# Patient Record
Sex: Female | Born: 1945
Health system: Southern US, Community
[De-identification: ages and names within clinical notes are randomized; demographics above are authoritative.]

## PROBLEM LIST (undated history)

## (undated) DIAGNOSIS — E785 Hyperlipidemia, unspecified: Secondary | ICD-10-CM

## (undated) DIAGNOSIS — Z9889 Other specified postprocedural states: Secondary | ICD-10-CM

## (undated) DIAGNOSIS — R3915 Urgency of urination: Secondary | ICD-10-CM

## (undated) DIAGNOSIS — M5136 Other intervertebral disc degeneration, lumbar region: Secondary | ICD-10-CM

## (undated) DIAGNOSIS — M2550 Pain in unspecified joint: Secondary | ICD-10-CM

## (undated) DIAGNOSIS — Z87442 Personal history of urinary calculi: Secondary | ICD-10-CM

## (undated) DIAGNOSIS — G8929 Other chronic pain: Secondary | ICD-10-CM

## (undated) DIAGNOSIS — K219 Gastro-esophageal reflux disease without esophagitis: Secondary | ICD-10-CM

## (undated) DIAGNOSIS — F419 Anxiety disorder, unspecified: Secondary | ICD-10-CM

## (undated) DIAGNOSIS — M549 Dorsalgia, unspecified: Secondary | ICD-10-CM

## (undated) DIAGNOSIS — N3281 Overactive bladder: Secondary | ICD-10-CM

## (undated) DIAGNOSIS — M1990 Unspecified osteoarthritis, unspecified site: Secondary | ICD-10-CM

## (undated) DIAGNOSIS — K5790 Diverticulosis of intestine, part unspecified, without perforation or abscess without bleeding: Secondary | ICD-10-CM

## (undated) DIAGNOSIS — M51369 Other intervertebral disc degeneration, lumbar region without mention of lumbar back pain or lower extremity pain: Secondary | ICD-10-CM

## (undated) DIAGNOSIS — R531 Weakness: Secondary | ICD-10-CM

## (undated) DIAGNOSIS — Z8709 Personal history of other diseases of the respiratory system: Secondary | ICD-10-CM

## (undated) DIAGNOSIS — M2540 Effusion, unspecified joint: Secondary | ICD-10-CM

## (undated) DIAGNOSIS — R112 Nausea with vomiting, unspecified: Secondary | ICD-10-CM

## (undated) DIAGNOSIS — E039 Hypothyroidism, unspecified: Secondary | ICD-10-CM

## (undated) DIAGNOSIS — R51 Headache: Secondary | ICD-10-CM

## (undated) HISTORY — PX: CARPAL TUNNEL RELEASE: SHX101

## (undated) HISTORY — PX: JOINT REPLACEMENT: SHX530

## (undated) HISTORY — PX: ABDOMINAL HYSTERECTOMY: SHX81

## (undated) HISTORY — PX: ESOPHAGOGASTRODUODENOSCOPY: SHX1529

## (undated) HISTORY — PX: HAND SURGERY: SHX662

## (undated) HISTORY — PX: EYE SURGERY: SHX253

## (undated) HISTORY — PX: COLONOSCOPY: SHX174

---

## 1978-07-05 HISTORY — PX: TONSILLECTOMY: SUR1361

## 1978-07-05 HISTORY — PX: BREAST SURGERY: SHX581

## 1979-07-06 HISTORY — PX: TOTAL THYROIDECTOMY: SHX2547

## 2011-07-06 HISTORY — PX: BACK SURGERY: SHX140

## 2012-03-24 ENCOUNTER — Other Ambulatory Visit: Payer: Self-pay | Admitting: Neurosurgery

## 2012-04-06 ENCOUNTER — Encounter (HOSPITAL_COMMUNITY): Payer: Self-pay | Admitting: Pharmacy Technician

## 2012-04-06 ENCOUNTER — Encounter (HOSPITAL_COMMUNITY): Payer: Self-pay

## 2012-04-06 ENCOUNTER — Encounter (HOSPITAL_COMMUNITY)
Admission: RE | Admit: 2012-04-06 | Discharge: 2012-04-06 | Disposition: A | Discharge status: Home / Self Care | Payer: Worker's Compensation | Source: Ambulatory Visit | Attending: Neurosurgery | Admitting: Neurosurgery

## 2012-04-06 HISTORY — DX: Other intervertebral disc degeneration, lumbar region: M51.36

## 2012-04-06 HISTORY — DX: Other intervertebral disc degeneration, lumbar region without mention of lumbar back pain or lower extremity pain: M51.369

## 2012-04-06 HISTORY — DX: Anxiety disorder, unspecified: F41.9

## 2012-04-06 HISTORY — DX: Hypothyroidism, unspecified: E03.9

## 2012-04-06 HISTORY — DX: Overactive bladder: N32.81

## 2012-04-06 HISTORY — DX: Unspecified osteoarthritis, unspecified site: M19.90

## 2012-04-06 LAB — BASIC METABOLIC PANEL
CO2: 27 mEq/L (ref 19–32)
Calcium: 9.3 mg/dL (ref 8.4–10.5)
GFR calc Af Amer: >90 mL/min (ref >90)

## 2012-04-06 LAB — CBC
MCV: 86.3 fL (ref 78.0–100.0)
Platelets: 311 10*3/uL (ref 150–400)
RBC: 4.98 MIL/uL (ref 3.87–5.11)
WBC: 11.2 10*3/uL — ABNORMAL HIGH (ref 4.0–10.5)

## 2012-04-06 LAB — SURGICAL PCR SCREEN: MRSA, PCR: NEGATIVE

## 2012-04-06 LAB — TYPE AND SCREEN
ABO/RH(D): O NEG
Antibody Screen: NEGATIVE

## 2012-04-06 NOTE — Pre-Procedure Instructions (Signed)
20 SMT. DOBBERTIN  04/06/2012   Your procedure is scheduled on:  Monday , October 14  Report to [facility] at 0530 AM.  Call this number if you have problems the morning of surgery: [phone]   Remember:   Do not eat food:After Midnight.  Take these medicines the morning of surgery with A SIP OF WATER: Levothyroxine, Omeprazole,Paroxetine,Estrace   Do not wear jewelry, make-up or nail polish.  Do not wear lotions, powders, or perfumes. You may wear deodorant.  Do not shave 48 hours prior to surgery. Men may shave face and neck.  Do not bring valuables to the hospital.  Contacts, dentures or bridgework may not be worn into surgery.  Leave suitcase in the car. After surgery it may be brought to your room.  For patients admitted to the hospital, checkout time is 11:00 AM the day of discharge.   Patients discharged the day of surgery will not be allowed to drive home.  Name and phone number of your driver: n/a  Special Instructions: Shower using CHG 2 nights before surgery and the night before surgery.  If you shower the day of surgery use CHG.  Use special wash - you have one bottle of CHG for all showers.  You should use approximately 1/3 of the bottle for each shower.   Please read over the following fact sheets that you were given: Pain Booklet, Coughing and Deep Breathing, Blood Transfusion Information and Surgical Site Infection Prevention

## 2012-04-13 ENCOUNTER — Other Ambulatory Visit: Payer: Self-pay | Admitting: Neurosurgery

## 2012-04-16 MED ORDER — VANCOMYCIN HCL IN DEXTROSE 1-5 GM/200ML-% IV SOLN
1000.0000 mg | INTRAVENOUS | Status: AC
  Administered 2012-04-17: 1000 mg via INTRAVENOUS
  Filled 2012-04-16: qty 200

## 2012-04-17 ENCOUNTER — Inpatient Hospital Stay (HOSPITAL_COMMUNITY): Payer: Worker's Compensation

## 2012-04-17 ENCOUNTER — Inpatient Hospital Stay (HOSPITAL_COMMUNITY)
Admission: RE | Admit: 2012-04-17 | Discharge: 2012-04-20 | DRG: 460 | Payer: Worker's Compensation | Source: Ambulatory Visit | Attending: Neurosurgery | Admitting: Neurosurgery

## 2012-04-17 ENCOUNTER — Encounter (HOSPITAL_COMMUNITY): Payer: Self-pay | Admitting: Anesthesiology

## 2012-04-17 ENCOUNTER — Encounter (HOSPITAL_COMMUNITY): Payer: Self-pay | Admitting: *Deleted

## 2012-04-17 ENCOUNTER — Inpatient Hospital Stay (HOSPITAL_COMMUNITY): Payer: Worker's Compensation | Admitting: Anesthesiology

## 2012-04-17 ENCOUNTER — Encounter (HOSPITAL_COMMUNITY)
Admission: RE | Disposition: A | Discharge status: Other Acute Inpatient Hospital | Payer: Self-pay | Source: Ambulatory Visit | Attending: Neurosurgery

## 2012-04-17 DIAGNOSIS — F329 Major depressive disorder, single episode, unspecified: Secondary | ICD-10-CM | POA: Diagnosis present

## 2012-04-17 DIAGNOSIS — Q762 Congenital spondylolisthesis: Secondary | ICD-10-CM

## 2012-04-17 DIAGNOSIS — E039 Hypothyroidism, unspecified: Secondary | ICD-10-CM | POA: Diagnosis present

## 2012-04-17 DIAGNOSIS — M5126 Other intervertebral disc displacement, lumbar region: Secondary | ICD-10-CM | POA: Diagnosis present

## 2012-04-17 DIAGNOSIS — Z87891 Personal history of nicotine dependence: Secondary | ICD-10-CM

## 2012-04-17 DIAGNOSIS — N318 Other neuromuscular dysfunction of bladder: Secondary | ICD-10-CM | POA: Diagnosis present

## 2012-04-17 DIAGNOSIS — M47817 Spondylosis without myelopathy or radiculopathy, lumbosacral region: Principal | ICD-10-CM | POA: Diagnosis present

## 2012-04-17 DIAGNOSIS — F3289 Other specified depressive episodes: Secondary | ICD-10-CM | POA: Diagnosis present

## 2012-04-17 DIAGNOSIS — Z90710 Acquired absence of both cervix and uterus: Secondary | ICD-10-CM

## 2012-04-17 DIAGNOSIS — K5900 Constipation, unspecified: Secondary | ICD-10-CM | POA: Diagnosis present

## 2012-04-17 DIAGNOSIS — F411 Generalized anxiety disorder: Secondary | ICD-10-CM | POA: Diagnosis present

## 2012-04-17 SURGERY — POSTERIOR LUMBAR FUSION 2 LEVEL
Anesthesia: General | Site: Back | Wound class: Clean

## 2012-04-17 MED ORDER — CYCLOBENZAPRINE HCL 10 MG PO TABS
ORAL_TABLET | ORAL | Status: AC
  Filled 2012-04-17: qty 1

## 2012-04-17 MED ORDER — ACETAMINOPHEN 10 MG/ML IV SOLN
INTRAVENOUS | Status: AC
  Filled 2012-04-17: qty 100

## 2012-04-17 MED ORDER — DOCUSATE SODIUM 100 MG PO CAPS
100.0000 mg | ORAL_CAPSULE | Freq: Two times a day (BID) | ORAL | Status: DC
  Administered 2012-04-17 – 2012-04-20 (×5): 100 mg via ORAL
  Filled 2012-04-17 (×5): qty 1

## 2012-04-17 MED ORDER — BUPIVACAINE HCL (PF) 0.25 % IJ SOLN
INTRAMUSCULAR | Status: DC | PRN
  Administered 2012-04-17: 10 mL

## 2012-04-17 MED ORDER — OXYCODONE HCL 5 MG/5ML PO SOLN: 5.0000 mg | Freq: Once | ORAL | Status: DC | PRN

## 2012-04-17 MED ORDER — DEXAMETHASONE SODIUM PHOSPHATE 10 MG/ML IJ SOLN
INTRAMUSCULAR | Status: AC
  Filled 2012-04-17: qty 1

## 2012-04-17 MED ORDER — ADULT MULTIVITAMIN W/MINERALS CH
1.0000 | ORAL_TABLET | Freq: Every day | ORAL | Status: DC
  Administered 2012-04-18 – 2012-04-20 (×3): 1 via ORAL
  Filled 2012-04-17 (×4): qty 1

## 2012-04-17 MED ORDER — SODIUM CHLORIDE 0.9 % IJ SOLN
3.0000 mL | Freq: Two times a day (BID) | INTRAMUSCULAR | Status: DC
  Administered 2012-04-18 – 2012-04-19 (×3): 3 mL via INTRAVENOUS

## 2012-04-17 MED ORDER — LEVOTHYROXINE SODIUM 88 MCG PO TABS
88.0000 ug | ORAL_TABLET | Freq: Every day | ORAL | Status: DC
  Administered 2012-04-18 – 2012-04-20 (×3): 88 ug via ORAL
  Filled 2012-04-17 (×4): qty 1

## 2012-04-17 MED ORDER — SODIUM CHLORIDE 0.9 % IJ SOLN: 3.0000 mL | INTRAMUSCULAR | Status: DC | PRN

## 2012-04-17 MED ORDER — OXYCODONE HCL 5 MG PO TABS
ORAL_TABLET | ORAL | Status: AC
  Filled 2012-04-17: qty 2

## 2012-04-17 MED ORDER — OXYCODONE HCL 5 MG PO TABS: 5.0000 mg | ORAL_TABLET | Freq: Once | ORAL | Status: DC | PRN

## 2012-04-17 MED ORDER — INFLUENZA VIRUS VACC SPLIT PF IM SUSP: 0.5000 mL | INTRAMUSCULAR | Status: DC | PRN

## 2012-04-17 MED ORDER — ONDANSETRON HCL 4 MG/2ML IJ SOLN: 4.0000 mg | INTRAMUSCULAR | Status: DC | PRN

## 2012-04-17 MED ORDER — SODIUM CHLORIDE 0.9 % IV SOLN
INTRAVENOUS | Status: AC
  Filled 2012-04-17: qty 500

## 2012-04-17 MED ORDER — MELOXICAM 15 MG PO TABS
15.0000 mg | ORAL_TABLET | Freq: Every day | ORAL | Status: DC
  Administered 2012-04-18 – 2012-04-20 (×3): 15 mg via ORAL
  Filled 2012-04-17 (×4): qty 1

## 2012-04-17 MED ORDER — LIDOCAINE-EPINEPHRINE 1 %-1:100000 IJ SOLN
INTRAMUSCULAR | Status: DC | PRN
  Administered 2012-04-17: 9 mL

## 2012-04-17 MED ORDER — MENTHOL 3 MG MT LOZG: 1.0000 | LOZENGE | OROMUCOSAL | Status: DC | PRN

## 2012-04-17 MED ORDER — PANTOPRAZOLE SODIUM 40 MG PO TBEC
40.0000 mg | DELAYED_RELEASE_TABLET | Freq: Every day | ORAL | Status: DC
  Administered 2012-04-18 – 2012-04-20 (×3): 40 mg via ORAL
  Filled 2012-04-17 (×2): qty 1

## 2012-04-17 MED ORDER — VANCOMYCIN HCL IN DEXTROSE 1-5 GM/200ML-% IV SOLN
1000.0000 mg | Freq: Two times a day (BID) | INTRAVENOUS | Status: AC
  Administered 2012-04-17: 1000 mg via INTRAVENOUS
  Filled 2012-04-17: qty 200

## 2012-04-17 MED ORDER — GLYCOPYRROLATE 0.2 MG/ML IJ SOLN
INTRAMUSCULAR | Status: DC | PRN
  Administered 2012-04-17: .4 mg via INTRAVENOUS

## 2012-04-17 MED ORDER — ACETAMINOPHEN 10 MG/ML IV SOLN
INTRAVENOUS | Status: DC | PRN
  Administered 2012-04-17: 1000 mg via INTRAVENOUS

## 2012-04-17 MED ORDER — HYDROMORPHONE HCL PF 1 MG/ML IJ SOLN: 0.5000 mg | INTRAMUSCULAR | Status: DC | PRN

## 2012-04-17 MED ORDER — OXYCODONE HCL 5 MG PO TABS
10.0000 mg | ORAL_TABLET | ORAL | Status: DC | PRN
  Administered 2012-04-17 – 2012-04-19 (×6): 10 mg via ORAL
  Administered 2012-04-19: 5 mg via ORAL
  Filled 2012-04-17 (×5): qty 2
  Filled 2012-04-17: qty 1

## 2012-04-17 MED ORDER — 0.9 % SODIUM CHLORIDE (POUR BTL) OPTIME
TOPICAL | Status: DC | PRN
  Administered 2012-04-17: 1000 mL

## 2012-04-17 MED ORDER — FENTANYL CITRATE 0.05 MG/ML IJ SOLN
INTRAMUSCULAR | Status: DC | PRN
Start: 2012-04-17 — End: 2012-04-17
  Administered 2012-04-17: 50 ug via INTRAVENOUS
  Administered 2012-04-17 (×2): 100 ug via INTRAVENOUS
  Administered 2012-04-17 (×2): 50 ug via INTRAVENOUS

## 2012-04-17 MED ORDER — THROMBIN 20000 UNITS EX KIT
PACK | CUTANEOUS | Status: DC | PRN
  Administered 2012-04-17: 20000 [IU] via TOPICAL

## 2012-04-17 MED ORDER — PHENOL 1.4 % MT LIQD: 1.0000 | OROMUCOSAL | Status: DC | PRN

## 2012-04-17 MED ORDER — ALUM & MAG HYDROXIDE-SIMETH 200-200-20 MG/5ML PO SUSP: 30.0000 mL | Freq: Four times a day (QID) | ORAL | Status: DC | PRN

## 2012-04-17 MED ORDER — MIDAZOLAM HCL 5 MG/5ML IJ SOLN
INTRAMUSCULAR | Status: DC | PRN
  Administered 2012-04-17: 2 mg via INTRAVENOUS

## 2012-04-17 MED ORDER — SODIUM CHLORIDE 0.9 % IR SOLN
Status: DC | PRN
  Administered 2012-04-17: 08:00:00

## 2012-04-17 MED ORDER — ONDANSETRON HCL 4 MG/2ML IJ SOLN
INTRAMUSCULAR | Status: DC | PRN
  Administered 2012-04-17: 4 mg via INTRAVENOUS

## 2012-04-17 MED ORDER — DEXAMETHASONE SODIUM PHOSPHATE 10 MG/ML IJ SOLN
10.0000 mg | INTRAMUSCULAR | Status: AC
  Administered 2012-04-17: 10 mg via INTRAVENOUS

## 2012-04-17 MED ORDER — SODIUM CHLORIDE 0.9 % IV SOLN
INTRAVENOUS | Status: DC | PRN
  Administered 2012-04-17: 10:00:00 via INTRAVENOUS

## 2012-04-17 MED ORDER — CYCLOBENZAPRINE HCL 10 MG PO TABS
10.0000 mg | ORAL_TABLET | Freq: Three times a day (TID) | ORAL | Status: DC | PRN
  Administered 2012-04-17 (×2): 10 mg via ORAL
  Filled 2012-04-17: qty 1

## 2012-04-17 MED ORDER — ACETAMINOPHEN 650 MG RE SUPP: 650.0000 mg | RECTAL | Status: DC | PRN

## 2012-04-17 MED ORDER — LIDOCAINE HCL (CARDIAC) 20 MG/ML IV SOLN
INTRAVENOUS | Status: DC | PRN
  Administered 2012-04-17: 100 mg via INTRAVENOUS

## 2012-04-17 MED ORDER — ACETAMINOPHEN 325 MG PO TABS
650.0000 mg | ORAL_TABLET | ORAL | Status: DC | PRN
  Administered 2012-04-19 – 2012-04-20 (×2): 650 mg via ORAL
  Filled 2012-04-17 (×2): qty 2

## 2012-04-17 MED ORDER — PAROXETINE HCL 20 MG PO TABS
20.0000 mg | ORAL_TABLET | Freq: Every day | ORAL | Status: DC
  Administered 2012-04-18 – 2012-04-20 (×3): 20 mg via ORAL
  Filled 2012-04-17 (×3): qty 1

## 2012-04-17 MED ORDER — NEOSTIGMINE METHYLSULFATE 1 MG/ML IJ SOLN
INTRAMUSCULAR | Status: DC | PRN
  Administered 2012-04-17: 3.5 mg via INTRAVENOUS

## 2012-04-17 MED ORDER — ROCURONIUM BROMIDE 100 MG/10ML IV SOLN
INTRAVENOUS | Status: DC | PRN
  Administered 2012-04-17: 50 mg via INTRAVENOUS

## 2012-04-17 MED ORDER — CEFAZOLIN SODIUM 1-5 GM-% IV SOLN: 1.0000 g | Freq: Three times a day (TID) | INTRAVENOUS | Status: DC

## 2012-04-17 MED ORDER — PREDNISOLONE ACETATE 1 % OP SUSP
1.0000 [drp] | Freq: Three times a day (TID) | OPHTHALMIC | Status: DC
  Administered 2012-04-17 – 2012-04-20 (×8): 1 [drp] via OPHTHALMIC
  Filled 2012-04-17: qty 1

## 2012-04-17 MED ORDER — BACITRACIN 50000 UNITS IM SOLR
INTRAMUSCULAR | Status: AC
  Filled 2012-04-17: qty 1

## 2012-04-17 MED ORDER — VECURONIUM BROMIDE 10 MG IV SOLR
INTRAVENOUS | Status: DC | PRN
  Administered 2012-04-17 (×2): 2 mg via INTRAVENOUS

## 2012-04-17 MED ORDER — LACTATED RINGERS IV SOLN
INTRAVENOUS | Status: DC | PRN
  Administered 2012-04-17: 07:00:00 via INTRAVENOUS

## 2012-04-17 MED ORDER — HEMOSTATIC AGENTS (NO CHARGE) OPTIME
TOPICAL | Status: DC | PRN
  Administered 2012-04-17: 1 via TOPICAL

## 2012-04-17 MED ORDER — HYDROMORPHONE HCL PF 1 MG/ML IJ SOLN: 0.2500 mg | INTRAMUSCULAR | Status: DC | PRN

## 2012-04-17 MED ORDER — ACYCLOVIR 400 MG PO TABS
400.0000 mg | ORAL_TABLET | Freq: Two times a day (BID) | ORAL | Status: DC
  Administered 2012-04-18: 400 mg via ORAL
  Filled 2012-04-17 (×3): qty 1

## 2012-04-17 MED ORDER — PROPOFOL 10 MG/ML IV BOLUS
INTRAVENOUS | Status: DC | PRN
  Administered 2012-04-17: 160 mg via INTRAVENOUS

## 2012-04-17 MED ORDER — ONDANSETRON HCL 4 MG/2ML IJ SOLN: 4.0000 mg | Freq: Once | INTRAMUSCULAR | Status: DC | PRN

## 2012-04-17 MED ORDER — SODIUM CHLORIDE 0.9 % IV SOLN: 250.0000 mL | INTRAVENOUS | Status: DC

## 2012-04-17 MED ORDER — ESTRADIOL 1 MG PO TABS
0.5000 mg | ORAL_TABLET | Freq: Every day | ORAL | Status: DC
  Administered 2012-04-18 – 2012-04-20 (×3): 0.5 mg via ORAL
  Filled 2012-04-17 (×3): qty 0.5

## 2012-04-17 MED ORDER — MEPERIDINE HCL 25 MG/ML IJ SOLN: 6.2500 mg | INTRAMUSCULAR | Status: DC | PRN

## 2012-04-17 SURGICAL SUPPLY — 73 items
BAG DECANTER FOR FLEXI CONT (MISCELLANEOUS) ×2 IMPLANT
BENZOIN TINCTURE PRP APPL 2/3 (GAUZE/BANDAGES/DRESSINGS) ×2 IMPLANT
BLADE SURG 11 STRL SS (BLADE) ×2 IMPLANT
BLADE SURG ROTATE 9660 (MISCELLANEOUS) IMPLANT
BRUSH SCRUB EZ PLAIN DRY (MISCELLANEOUS) ×2 IMPLANT
BUR MATCHSTICK NEURO 3.0 LAGG (BURR) ×2 IMPLANT
BUR PRECISION FLUTE 6.0 (BURR) ×2 IMPLANT
CANISTER SUCTION 2500CC (MISCELLANEOUS) ×2 IMPLANT
CAP LOCKING REVERE (Cap) ×12 IMPLANT
CLOTH BEACON ORANGE TIMEOUT ST (SAFETY) ×2 IMPLANT
CONNECTOR VAR CROSS 5.5X38-50 (Connector) ×2 IMPLANT
CONT SPEC 4OZ CLIKSEAL STRL BL (MISCELLANEOUS) ×4 IMPLANT
COVER BACK TABLE 24X17X13 BIG (DRAPES) IMPLANT
COVER TABLE BACK 60X90 (DRAPES) ×2 IMPLANT
DECANTER SPIKE VIAL GLASS SM (MISCELLANEOUS) ×2 IMPLANT
DERMABOND ADVANCED (GAUZE/BANDAGES/DRESSINGS) ×1
DERMABOND ADVANCED .7 DNX12 (GAUZE/BANDAGES/DRESSINGS) ×1 IMPLANT
DRAPE C-ARM 42X72 X-RAY (DRAPES) ×4 IMPLANT
DRAPE LAPAROTOMY 100X72X124 (DRAPES) ×2 IMPLANT
DRAPE POUCH INSTRU U-SHP 10X18 (DRAPES) ×2 IMPLANT
DRAPE PROXIMA HALF (DRAPES) IMPLANT
DRAPE SURG 17X23 STRL (DRAPES) ×2 IMPLANT
DRSG OPSITE 4X5.5 SM (GAUZE/BANDAGES/DRESSINGS) ×2 IMPLANT
ELECT REM PT RETURN 9FT ADLT (ELECTROSURGICAL) ×2
ELECTRODE REM PT RTRN 9FT ADLT (ELECTROSURGICAL) ×1 IMPLANT
EVACUATOR 3/16  PVC DRAIN (DRAIN) ×1
EVACUATOR 3/16 PVC DRAIN (DRAIN) ×1 IMPLANT
GAUZE SPONGE 4X4 16PLY XRAY LF (GAUZE/BANDAGES/DRESSINGS) ×2 IMPLANT
GLOVE BIO SURGEON STRL SZ7.5 (GLOVE) ×2 IMPLANT
GLOVE BIO SURGEON STRL SZ8 (GLOVE) ×4 IMPLANT
GLOVE BIOGEL PI IND STRL 7.0 (GLOVE) ×1 IMPLANT
GLOVE BIOGEL PI IND STRL 7.5 (GLOVE) ×1 IMPLANT
GLOVE BIOGEL PI INDICATOR 7.0 (GLOVE) ×1
GLOVE BIOGEL PI INDICATOR 7.5 (GLOVE) ×1
GLOVE ECLIPSE 8.5 STRL (GLOVE) ×2 IMPLANT
GLOVE EXAM NITRILE LRG STRL (GLOVE) IMPLANT
GLOVE EXAM NITRILE MD LF STRL (GLOVE) IMPLANT
GLOVE EXAM NITRILE XL STR (GLOVE) IMPLANT
GLOVE EXAM NITRILE XS STR PU (GLOVE) IMPLANT
GLOVE INDICATOR 8.5 STRL (GLOVE) ×4 IMPLANT
GLOVE SS BIOGEL STRL SZ 6.5 (GLOVE) ×4 IMPLANT
GLOVE SUPERSENSE BIOGEL SZ 6.5 (GLOVE) ×4
GOWN BRE IMP SLV AUR LG STRL (GOWN DISPOSABLE) ×2 IMPLANT
GOWN BRE IMP SLV AUR XL STRL (GOWN DISPOSABLE) ×6 IMPLANT
GOWN STRL REIN 2XL LVL4 (GOWN DISPOSABLE) IMPLANT
KIT BASIN OR (CUSTOM PROCEDURE TRAY) ×2 IMPLANT
KIT ROOM TURNOVER OR (KITS) ×2 IMPLANT
MILL MEDIUM DISP (BLADE) ×2 IMPLANT
NEEDLE HYPO 25X1 1.5 SAFETY (NEEDLE) ×2 IMPLANT
NS IRRIG 1000ML POUR BTL (IV SOLUTION) ×2 IMPLANT
PACK LAMINECTOMY NEURO (CUSTOM PROCEDURE TRAY) ×2 IMPLANT
PAD ARMBOARD 7.5X6 YLW CONV (MISCELLANEOUS) ×6 IMPLANT
PATTIES SURGICAL 1X1 (DISPOSABLE) ×2 IMPLANT
PUTTY BONE DBX 5CC MIX (Putty) ×2 IMPLANT
ROD STRAIGHT 5.5MMX65MM (Rod) ×4 IMPLANT
SCREW PEDICLE 6.5MMX45MM (Screw) ×4 IMPLANT
SCREW PEDICLE 6.5X40MM (Screw) ×8 IMPLANT
SCREW PEDICLE 6.5X45 (Screw) ×2 IMPLANT
SPACER CALIBER 10X22 9-13MM-12 (Spacer) ×4 IMPLANT
SPACER CALIBER 10X22MM 11-15MM (Spacer) ×4 IMPLANT
SPONGE GAUZE 4X4 12PLY (GAUZE/BANDAGES/DRESSINGS) ×2 IMPLANT
SPONGE LAP 4X18 X RAY DECT (DISPOSABLE) IMPLANT
SPONGE SURGIFOAM ABS GEL 100 (HEMOSTASIS) ×4 IMPLANT
STRIP CLOSURE SKIN 1/2X4 (GAUZE/BANDAGES/DRESSINGS) ×2 IMPLANT
SUT VIC AB 0 CT1 18XCR BRD8 (SUTURE) ×1 IMPLANT
SUT VIC AB 0 CT1 8-18 (SUTURE) ×1
SUT VIC AB 2-0 CT1 18 (SUTURE) ×2 IMPLANT
SUT VICRYL 4-0 PS2 18IN ABS (SUTURE) ×2 IMPLANT
SYR 20ML ECCENTRIC (SYRINGE) ×2 IMPLANT
TOWEL OR 17X24 6PK STRL BLUE (TOWEL DISPOSABLE) ×2 IMPLANT
TOWEL OR 17X26 10 PK STRL BLUE (TOWEL DISPOSABLE) ×2 IMPLANT
TRAY FOLEY CATH 14FRSI W/METER (CATHETERS) ×2 IMPLANT
WATER STERILE IRR 1000ML POUR (IV SOLUTION) ×2 IMPLANT

## 2012-04-17 NOTE — H&P (Signed)
Shannon Gutierrez is an 66 y.o. female.   Chief Complaint: Back and right greater left leg pain HPI: Patient is a very pleasant 66 year old female who sustained a work-related injury where she fell off a stool at work and ever since and has been experiencing severe back and right greater left leg pain. Workup has revealed severe spinal stenosis from disc bulge and facet arthropathy at L3-4 and L4-5 patient's failed all forms of conservative treatment due to her imaging and progression of clinical syndrome in her back greater than right leg pain I recommended a decompression stabilization procedure at L3-4 and L4-5 I extensively reviewed the risks and benefits of the operation with the patient as well as perioperative course and expectations of outcome alternatives of surgery she understands and agrees to proceed forward.  Past Medical History  Diagnosis Date  . Hypothyroidism   . Anxiety   . Overactive bladder   . Arthritis     osteoarthritis in knees and back  . Degeneration of lumbar intervertebral disc     Past Surgical History  Procedure Date  . Carpal tunnel release     bilateral  . Tonsillectomy   . Abdominal hysterectomy   . Eye surgery   . Breast surgery 1980    augmentation  . Joint replacement   . Hand surgery     CMP joint  . Total thyroidectomy 1980    History reviewed. No pertinent family history. Social History:  reports that she has quit smoking. Her smoking use included Cigarettes. She has a 10 pack-year smoking history. She has never used smokeless tobacco. She reports that she does not drink alcohol or use illicit drugs.  Allergies:  Allergies  Allergen Reactions  . Keflex (Cephalexin) Other (See Comments)    Facial swelling and rash  . Morphine And Related Nausea And Vomiting    Medications Prior to Admission  Medication Sig Dispense Refill  . acyclovir (ZOVIRAX) 400 MG tablet Take 400 mg by mouth 2 (two) times daily as needed. For cold sore flare ups        . estradiol (ESTRACE) 0.5 MG tablet Take 0.5 mg by mouth daily.      Marland Kitchen levothyroxine (SYNTHROID, LEVOTHROID) 88 MCG tablet Take 88 mcg by mouth daily.      . meloxicam (MOBIC) 15 MG tablet Take 15 mg by mouth daily.      . Multiple Vitamin (MULTIVITAMIN WITH MINERALS) TABS Take 1 tablet by mouth daily.      Marland Kitchen omeprazole (PRILOSEC) 20 MG capsule Take 20 mg by mouth daily.      Marland Kitchen PARoxetine (PAXIL) 20 MG tablet Take 20 mg by mouth every morning.        No results found for this or any previous visit (from the past 48 hour(s)). No results found.  Review of Systems  Constitutional: Negative.   HENT: Negative.   Eyes: Negative.   Cardiovascular: Negative.   Gastrointestinal: Negative.   Genitourinary: Negative.   Musculoskeletal: Positive for myalgias, back pain and joint pain.  Skin: Negative.   Neurological: Positive for tingling and tremors.  Endo/Heme/Allergies: Negative.   Psychiatric/Behavioral: Negative.     Blood pressure 159/85, pulse 76, temperature 98.2 F (36.8 C), temperature source Oral, resp. rate 18, SpO2 96.00%. Physical Exam  Constitutional: She is oriented to person, place, and time. She appears well-developed and well-nourished.  HENT:  Head: Normocephalic.  Eyes: Conjunctivae normal are normal. Pupils are equal, round, and reactive to light.  Neck: Normal range  of motion.  Cardiovascular: Normal rate.   Respiratory: Effort normal.  GI: Soft.  Neurological: She is alert and oriented to person, place, and time. She has normal strength. GCS eye subscore is 4. GCS verbal subscore is 5. GCS motor subscore is 6.  Reflex Scores:      Patellar reflexes are 0 on the right side and 0 on the left side.      Achilles reflexes are 0 on the right side and 0 on the left side.      Strength is 5 out of 5 in her iliopsoas, quads, and she's, gastrocs, anterior tibialis, and EHL     Assessment/Plan 66 year old female that presents for L3-4 and L4-5 posterior lumbar  interbody fusion  Ashanty Coltrane P 04/17/2012, 7:27 AM

## 2012-04-17 NOTE — Transfer of Care (Signed)
Immediate Anesthesia Transfer of Care Note  Patient: Shannon Gutierrez  Procedure(s) Performed: Procedure(s) (LRB) with comments: POSTERIOR LUMBAR FUSION 2 LEVEL (N/A) - posterior lumbar interbody fusion lumbar three-four, lumbar four-five with screws and rods  Patient Location: PACU  Anesthesia Type: General  Level of Consciousness: sedated, patient cooperative and responds to stimulation  Airway & Oxygen Therapy: Patient Spontanous Breathing and Patient connected to nasal cannula oxygen  Post-op Assessment: Report given to PACU RN, Post -op Vital signs reviewed and stable and Patient moving all extremities X 4  Post vital signs: Reviewed and stable  Complications: No apparent anesthesia complications

## 2012-04-17 NOTE — Anesthesia Postprocedure Evaluation (Signed)
Anesthesia Post Note  Patient: Shannon Gutierrez  Procedure(s) Performed: Procedure(s) (LRB): POSTERIOR LUMBAR FUSION 2 LEVEL (N/A)  Anesthesia type: general  Patient location: PACU  Post pain: Pain level controlled  Post assessment: Patient's Cardiovascular Status Stable  Last Vitals:  Filed Vitals:   04/17/12 1215  BP: 125/47  Pulse: 88  Temp:   Resp: 19    Post vital signs: Reviewed and stable  Level of consciousness: sedated  Complications: No apparent anesthesia complications

## 2012-04-17 NOTE — Op Note (Signed)
Preoperative diagnosis: Lumbar spinal stenosis from herniated nucleus pulposus L4-5 and L3-4 facet arthropathy spondylosis L3-4 L4-5 and grade 1 spondylolisthesis L4-5  Postoperative diagnosis: Same  Procedure: Decompressive lumbar laminectomy at L3-4 L4-5 and excess will be needed with a standard interbody fusion  2) posterior lumbar interbody fusion L3-4 L4-5 using caliper expandable peek cages packed with local autograft mixed with DBX  #3 pedicle screw fixation using the globus Revere pedicle to system 5.5  #4 posterior lateral arthrodesis L3-4 L4-5  #5 placement of large Hemovac drain  Surgeon: Jillyn Hidden Sotiria Keast  Assistant: Sherilyn Cooter pool  Anesthesia: Gen.  EBL: 600  History of present illness: Patient is a very pleasant 66 year old female who sustained a work-related injury he has had progressive worsening back and on the right leg pain rate in the back of her leg her shin top foot and occasionally anterior quad and lateral thigh. Imaging showed herniated disc L4-5 on the right severe stress reactions in both pedicles marked facet arthropathy grade 1 spinal listhesis L4-5 and severe stenosis at L3-4 from a combination of facet arthropathy and disc herniation to the left due to patient's failure conservative treatment imaging findings and progression of clinical syndrome she was recommended decompression stabilization procedure at L3-4 L4-5 except for what reviewed the risks and benefits of the operation as well as perioperative course and expectations of outcome alternatives surgery and she understands and agrees to proceed forward.  Operative procedure: Patient was brought into the or was induced under general anesthesia her back was prepped and draped in routine sterile fashion. After infiltration 10 cc lidocaine with epi a midline incision was made and Bovie left car was used to take down the subcutaneous tissues and subperiosteal dissections care lamina of L3 and L4-L5 bilaterally the TPS L3-4  and 5 were also exposed interoperative X. identify the appropriate level so spinous processes at L4 and L5 removed L4 possibly markedly unstable with hyper mediastinal and a complex being in her hypermobile Sosa to be decompression was begun at L4-5 and then however due to the marked hourglass compression of thecal sac at this level I went up higher at L3-4 and finish the central decompression and complete medial facetectomies at that level with foraminotomies of the 3 and the 4 root and in March down further unroofing the 4 root and due to medial facetectomies L4-5 the this allowed adequate decompression of both 34 and V nerve roots bilaterally and aggressive abutting of the superior tickling facet both sides also Galli the lateral access to the disc spaces. The decompression been completed attention was taken to pedicle screw placement using a high-speed drill pilot holes were drilled and L3 on the right cannulated with the awl probed O55 Probed again and a 6 5 x 45 screw inserted L3 on the right. Fluoroscopy used the step along the way and using direct intra-1 external bony landmarks and this confirmed no mediolateral breech. Then in a similar fashion 6 x 40 screws inserted L4-L5 on the right and 12/08/1944 and L3 on the left and a 6 x 40 at L4-L5 on the left the after all the screws and placed at its taken the interbody work I distractor in size 10 was inserted and the right-sided disc spaces and cleanout the left at L4-5 using a size 9 rotating cutter and Epstein curettes endplates were prepared in a large amount disc material was removed from the left side both centrally and laterally. Then an 11 mm cages packed this was inserted on the  left side expanded for turns up to approximately 13 mm then the distractors removed fluoroscopy confirmed good step in each position of the implant along the way. Then at L3-4 in a similar fashion a 10 distractor was inserted however this is felt to be a appropriate sizing for  a 9 mm graft so a similar fashion the endplates were prepared large lateral disc herniation was removed from the lateral compartment the disc space at L3-4 and the undersurface of the 3 and 4 were aggressively decompressed with removal that this. In 9 the medications inserted expanded up to about 11/2 mm extractor was removed fluoroscopy confirmed good position. Then all this was repeated on the right side there is a very large lateral disc herniation L4-5 on the right this was teased off of the 4 and 5 root decompressing the disc space is also a central disc was removed local autograft packed centrally the right-sided cage was inserted and a similar fashion L3-4 was also cleaned out and packed autograft centrally and a cages packed laterally. Fluoroscopy used in simple the way confirm the position of the implants then final AP and lateral projections confirmed good position of screws and implants and the wound scope was irrigated meticulous in space was maintained aggressive decortication carried TPS lateral gutters the rods were then selected top tightness and at L5 the L4 screws compressed this L5 any L3 compressed against L4 crossing was applied after all the foraminal reinspected confirm patency no migration of graft material Gelfoam was laid over the dura a large Hemovac drain was placed the wounds and closed in layers with after Vicryl the skin was closed a 4 subcuticular,  benzo and Steri-Strips were applied patient recovered in stable condition. At the end of case on it counts and sponge counts were correct.

## 2012-04-17 NOTE — Anesthesia Preprocedure Evaluation (Addendum)
Anesthesia Evaluation  Patient identified by MRN, date of birth, ID band Patient awake    Reviewed: Allergy & Precautions, H&P , NPO status , Patient's Chart, lab work & pertinent test results  Airway Mallampati: I TM Distance: >3 FB Neck ROM: Full    Dental   Pulmonary former smoker,          Cardiovascular     Neuro/Psych    GI/Hepatic GERD-  Medicated and Controlled,  Endo/Other    Renal/GU      Musculoskeletal   Abdominal   Peds  Hematology   Anesthesia Other Findings   Reproductive/Obstetrics                           Anesthesia Physical Anesthesia Plan  ASA: II  Anesthesia Plan: General   Post-op Pain Management:    Induction: Intravenous  Airway Management Planned: Oral ETT  Additional Equipment:   Intra-op Plan:   Post-operative Plan: Extubation in OR  Informed Consent: I have reviewed the patients History and Physical, chart, labs and discussed the procedure including the risks, benefits and alternatives for the proposed anesthesia with the patient or authorized representative who has indicated his/her understanding and acceptance.     Plan Discussed with: CRNA and Surgeon  Anesthesia Plan Comments:         Anesthesia Quick Evaluation  

## 2012-04-17 NOTE — Progress Notes (Signed)
Utilization review completed. Kyilee Gregg, RN, BSN. 

## 2012-04-18 MED ORDER — ACYCLOVIR 200 MG PO CAPS
400.0000 mg | ORAL_CAPSULE | Freq: Two times a day (BID) | ORAL | Status: DC
  Administered 2012-04-18 – 2012-04-20 (×4): 400 mg via ORAL
  Filled 2012-04-18 (×5): qty 2

## 2012-04-18 NOTE — Progress Notes (Addendum)
   CARE MANAGEMENT NOTE 04/18/2012  Patient:  Shannon Gutierrez, Shannon Gutierrez   Account Number:  [id number]  Date Initiated:  04/18/2012  Documentation initiated by:  [facility]  Subjective/Objective Assessment:   decompression lumbar laminectomy at L3-L4, and L4-L5     Action/Plan:   lives at home   Anticipated DC Date:  04/20/2012   Anticipated DC Plan:  IP REHAB FACILITY      DC Planning Services  CM consult      Choice offered to / List presented to:             Status of service:  In process, will continue to follow Medicare Important Message given?   (If response is "NO", the following Medicare IM given date fields will be blank) Date Medicare IM given:   Date Additional Medicare IM given:    Discharge Disposition:    Per UR Regulation:  Reviewed for med. necessity/level of care/duration of stay  If discussed at Long Length of Stay Meetings, dates discussed:    Comments:  04/18/2012 1400 NCM received call back from Shannon Gutierrez requesting PT/OT recommendations. Fax # [phone]. States she will follow up with adjuster. NCM made aware that pt will had Inpt Rehab consult. Request info be faxed when complete about acceptance or denial for CIR. Shannon Donning RN CCM Case Mgmt  phone [phone]   04/18/2012 1030 NCM spoke to pt and states she does live at home alone. She is interested in SNF or Inpatient Rehab. CIR consult and waiting recommendations. States she does not have any DME at home. Her back injury is a work related accident. Pt had questions about if Worker's Comp will cover her medical expenses. NCM instructed her to follow up with her Case Worker. Pt gave permission to follow up with her Worker's Comp NCM, Shannon Gutierrez and/or adjuster, Alla Feeling with clinical information.  Nurse Case Manager- Shannon [phone]  [facility] Norlene Campbell [phone]  Shannon Donning RN CCM Case Mgmt phone [phone]

## 2012-04-18 NOTE — Evaluation (Signed)
Occupational Therapy Evaluation Patient Details Name: Shannon Gutierrez MRN: [id number] DOB: 1945/11/02 Today's Date: 04/18/2012 Time: 2440-1027 OT Time Calculation (min): 36 min  OT Assessment / Plan / Recommendation Clinical Impression  Pt presents to OT s/p back surgery.  Pt will benefit from acute OT services to increase I with ADL activity while following back precautions    OT Assessment  Patient needs continued OT Services    Follow Up Recommendations  Home health OT;Inpatient Rehab;Skilled nursing facility;Other (comment) (depending on care available at home)       Equipment Recommendations  3 in 1 bedside comode       Frequency  Min 3X/week    Precautions / Restrictions Precautions Precautions: Back Precaution Booklet Issued: Yes (comment) Required Braces or Orthoses: Spinal Brace Spinal Brace: Lumbar corset;Applied in sitting position       ADL  Eating/Feeding: Performed;Set up Where Assessed - Eating/Feeding: Chair Grooming: Simulated;Set up Where Assessed - Grooming: Supported sitting Upper Body Bathing: Simulated;Minimal assistance Where Assessed - Upper Body Bathing: Unsupported sitting Lower Body Bathing: Simulated;Maximal assistance Where Assessed - Lower Body Bathing: Unsupported sit to stand Lower Body Dressing: Performed;Moderate assistance Where Assessed - Lower Body Dressing: Unsupported sit to stand Toilet Transfer: Simulated;Minimal assistance Toilet Transfer Method: Sit to stand Toilet Transfer Equipment: Comfort height toilet Toileting - Clothing Manipulation and Hygiene: Simulated;Minimal assistance Where Assessed - Engineer, mining and Hygiene: Standing Tub/Shower Transfer Method: Not assessed    OT Diagnosis:    OT Problem List: Decreased strength;Pain;Decreased knowledge of precautions OT Treatment Interventions: Patient/family education;DME and/or AE instruction   OT Goals Acute Rehab OT Goals OT Goal Formulation: With  patient Time For Goal Achievement: 05/02/12 Potential to Achieve Goals: Good ADL Goals Pt Will Perform Grooming: with modified independence;Standing at sink ADL Goal: Grooming - Progress: Goal set today Pt Will Perform Lower Body Dressing: with modified independence;Sit to stand from chair;with adaptive equipment ADL Goal: Lower Body Dressing - Progress: Goal set today Pt Will Transfer to Toilet: with modified independence;Comfort height toilet ADL Goal: Toilet Transfer - Progress: Goal set today Pt Will Perform Toileting - Clothing Manipulation: with modified independence ADL Goal: Toileting - Clothing Manipulation - Progress: Goal set today  Visit Information  Last OT Received On: 04/18/12    Subjective Data  Subjective: i would like to get back up Patient Stated Goal: regain I   Prior Functioning     Home Living Lives With: Alone Available Help at Discharge: Family (2 sons and daughter can help with driving/groceries) Type of Home: Other (Comment) (townhouse) Home Access: Level entry Home Layout: One level Bathroom Shower/Tub: Health visitor: Standard Bathroom Accessibility: Yes How Accessible: Accessible via walker Home Adaptive Equipment: None Prior Function Level of Independence: Independent Able to Take Stairs?: No Driving: Yes Vocation: Other (comment) (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 for tasks assessed Left Upper Extremity Assessment LUE ROM/Strength/Tone: [facility] for tasks assessed     Mobility Bed Mobility Bed Mobility:  Rolling Right;Right Sidelying to Sit;Sitting - Scoot to Edge of Bed Rolling Right: 4: Min assist;With rail Right Sidelying to Sit: 4: Min assist;With rails;HOB flat Sitting - Scoot  to Edge of Bed: 4: Min assist Transfers Sit to Stand: 4: Min assist;With upper extremity assist;From bed;From chair/3-in-1 Stand to Sit: With upper extremity assist;With armrests;To chair/3-in-1;4: Min assist Details for Transfer Assistance: vc for safe use of RW and safe hand placement; pt required assist to steady herself as transitioning her hands from the bed to the RW              End of Session OT - End of Session Equipment Utilized During Treatment: Back brace  GO     Alba Cory 04/18/2012, 1:29 PM

## 2012-04-18 NOTE — Evaluation (Signed)
Physical Therapy Evaluation Patient Details Name: Shannon Gutierrez MRN: [id number] DOB: [birth year] Today's Date: 04/18/2012 Time: 4098-1191 PT Time Calculation (min): 33 min  PT Assessment / Plan / Recommendation Clinical Impression  66 yo adm for L3-5 laminectomy and PLIF due to severe back and bil leg pain. Pt lives alone and although she has local children, they cannot provide 24/7 assist for her on initial d/c home. Will benefit from PT to incr independence and safety with mobility while adhering to back precautions.    PT Assessment  Patient needs continued PT services    Follow Up Recommendations  Post acute inpatient    Does the patient have the potential to tolerate intense rehabilitation   Yes, Recommend IP Rehab Screening  Barriers to Discharge Decreased caregiver support      Equipment Recommendations  Rolling walker with 5" wheels    Recommendations for Other Services OT consult   Frequency Min 5X/week    Precautions / Restrictions Precautions Precautions: Back Precaution Booklet Issued: Yes (comment) Required Braces or Orthoses: Spinal Brace Spinal Brace: Lumbar corset;Applied in sitting position   Pertinent Vitals/Pain 7/10 back pain (denied leg pain throughout session); RN notified and pain medicine given      Mobility  Bed Mobility Bed Mobility: Rolling Right;Right Sidelying to Sit;Sitting - Scoot to [place] of Bed Rolling Right: 4: Min assist;With rail Right Sidelying to Sit: 4: Min assist;With rails;HOB flat Sitting - Scoot to [place] of Bed: 4: Min guard Details for Bed Mobility Assistance: vc for techniques to maintain back precautions; allowed use of rail due to degree of pain and first time OOB Transfers Transfers: Sit to Stand;Stand to Sit Sit to Stand: 4: Min assist;With upper extremity assist;From bed Stand to Sit: 4: Min guard;With upper extremity assist;With armrests;To chair/3-in-1 Details for Transfer Assistance: vc for safe use of RW and safe  hand placement; pt required assist to steady herself as transitioning her hands from the bed to the RW Ambulation/Gait Ambulation/Gait Assistance: 4: Min assist Ambulation Distance (Feet): 80 Feet Assistive device: Rolling walker Ambulation/Gait Assistance Details: vc for safe, proper use of RW including staying closer to RW to promote upright posture, assist for turning to move RW Gait Pattern: Step-through pattern;Decreased stride length Gait velocity: significantly decr Stairs: No    Shoulder Instructions     Exercises     PT Diagnosis: Difficulty walking;Acute pain  PT Problem List: Decreased activity tolerance;Decreased mobility;Decreased knowledge of use of DME;Decreased knowledge of precautions;Pain PT Treatment Interventions: DME instruction;Gait training;Functional mobility training;Therapeutic activities;Patient/family education   PT Goals Acute Rehab PT Goals PT Goal Formulation: With patient Time For Goal Achievement: 04/25/12 Potential to Achieve Goals: Good Pt will Roll Supine to Right Side: with modified independence PT Goal: Rolling Supine to Right Side - Progress: Goal set today Pt will go Supine/Side to Sit: with modified independence;with HOB 0 degrees PT Goal: Supine/Side to Sit - Progress: Goal set today Pt will go Sit to Supine/Side: with modified independence;with HOB 0 degrees PT Goal: Sit to Supine/Side - Progress: Goal set today Pt will go Sit to Stand: with modified independence;with upper extremity assist PT Goal: Sit to Stand - Progress: Goal set today Pt will go Stand to Sit: with modified independence;with upper extremity assist PT Goal: Stand to Sit - Progress: Goal set today Pt will Ambulate: >150 feet;with modified independence;with least restrictive assistive device PT Goal: Ambulate - Progress: Goal set today Additional Goals Additional Goal #1: Pt will verbalize and adhere to back precautions while  mobilizing. PT Goal: Additional Goal #1 -  Progress: Goal set today  Visit Information  Last PT Received On: 04/18/12 Assistance Needed: +1    Subjective Data  Subjective: Pt reports she has no one who can stay with her around the clock.  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; nursing triage for dermatology office) Communication Communication: No difficulties    Cognition  Overall Cognitive Status: Appears within functional limits for tasks assessed/performed Arousal/Alertness: Awake/alert Orientation Level: Oriented X4 / Intact Behavior During Session: [facility] for tasks performed    Extremity/Trunk Assessment Right Lower Extremity Assessment RLE ROM/Strength/Tone: [facility] for tasks assessed Left Lower Extremity Assessment LLE ROM/Strength/Tone: [facility] for tasks assessed Trunk Assessment Trunk Assessment: Normal   Balance    End of Session PT - End of Session Equipment Utilized During Treatment: Gait belt;Back brace Activity Tolerance: Patient limited by pain Patient left: in chair;with call bell/phone within reach Nurse Communication: Mobility status;Patient requests pain meds  GP     Lillyana Majette 04/18/2012, 9:26 AM  Pager [phone]

## 2012-04-18 NOTE — Progress Notes (Signed)
Subjective: Patient reports She's doing well she has no leg pain back pain is controlled  Objective: Vital signs in last 24 hours: Temp:  [97.5 F (36.4 C)-98 F (36.7 C)] 97.7 F (36.5 C) (10/15 0413) Pulse Rate:  [88-102] 95  (10/15 0413) Resp:  [18-20] 20  (10/15 0413) BP: (109-158)/(43-98) 120/51 mmHg (10/15 0413) SpO2:  [90 %-95 %] 94 % (10/15 0413) Weight:  [72.576 kg (160 lb)] 72.576 kg (160 lb) (10/14 1345)  Intake/Output from previous day: 10/14 0701 - 10/15 0700 In: 2850 [I.V.:2700; Blood:150] Out: 3140 [Urine:2400; Drains:340; Blood:400] Intake/Output this shift:    Strength out of 5 wound clean and dry  Lab Results: No results found for this basename: WBC:2,HGB:2,HCT:2,PLT:2 in the last 72 hours BMET No results found for this basename: NA:2,K:2,CL:2,CO2:2,GLUCOSE:2,BUN:2,CREATININE:2,CALCIUM:2 in the last 72 hours  Studies/Results: Dg Lumbar Spine 2-3 Views  04/17/2012  *RADIOLOGY REPORT*  Clinical Data: Back pain  DG C-ARM 61-120 MIN,LUMBAR SPINE - 2-3 VIEW  Comparison: None.  Findings: C-arm films document L3-L5 posterolateral and interbody fusion.  Satisfactory position and alignment.  IMPRESSION: As above.   Original Report Authenticated By: Elsie Stain, M.D.    Dg C-arm 61-120 Min  04/17/2012  *RADIOLOGY REPORT*  Clinical Data: Back pain  DG C-ARM 61-120 MIN,LUMBAR SPINE - 2-3 VIEW  Comparison: None.  Findings: C-arm films document L3-L5 posterolateral and interbody fusion.  Satisfactory position and alignment.  IMPRESSION: As above.   Original Report Authenticated By: Elsie Stain, M.D.     Assessment/Plan: Progressive mobilization today with physical outpatient therapy DC her Foley  LOS: 1 day     Sid Greener P 04/18/2012, 7:40 AM

## 2012-04-18 NOTE — Progress Notes (Signed)
Orthopedic Tech Progress Note Patient Details:  DELAIN HAAGENSEN [birth year] [id number]  Patient ID: Vanessa Elissia, female   DOB: [birth year], 66 y.o.   MRN: [id number]   Shawnie Pons 04/18/2012, 3:19 PM L-S  SUPPORT WITH ANT. AND POST PANELS COMPLETED BY [facility]

## 2012-04-18 NOTE — Progress Notes (Signed)
Rehab Admissions Coordinator Note:  Patient was screened by Brock Ra for appropriateness for an Inpatient Acute Rehab Consult. Pt already Min A ambulating 80' first time up post op.  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  At this time, we are recommending an Acute Inpatient Rehab consult.  Brock Ra 04/18/2012, 10:45 AM  I can be reached at [phone].

## 2012-04-19 MED FILL — Heparin Sodium (Porcine) Inj 1000 Unit/ML: INTRAMUSCULAR | Qty: 30 | Status: AC

## 2012-04-19 MED FILL — Sodium Chloride IV Soln 0.9%: INTRAVENOUS | Qty: 1000 | Status: AC

## 2012-04-19 MED FILL — Sodium Chloride Irrigation Soln 0.9%: Qty: 3000 | Status: AC

## 2012-04-19 NOTE — Progress Notes (Signed)
Subjective: Patient reports That she's feeling better her leg still give her no problems her back pain is manageable with the pain medication she is ambulating and voiding  Objective: Vital signs in last 24 hours: Temp:  [98 F (36.7 C)-99.6 F (37.6 C)] 98.7 F (37.1 C) (10/16 0559) Pulse Rate:  [91-100] 91  (10/16 0559) Resp:  [18] 18  (10/16 0559) BP: (106-136)/(42-58) 107/42 mmHg (10/16 0559) SpO2:  [91 %-96 %] 96 % (10/16 0559)  Intake/Output from previous day: 10/15 0701 - 10/16 0700 In: -  Out: 505 [Drains:505] Intake/Output this shift:    Strength is 5 out of 5 wound is clean and dry  Lab Results: No results found for this basename: WBC:2,HGB:2,HCT:2,PLT:2 in the last 72 hours BMET No results found for this basename: NA:2,K:2,CL:2,CO2:2,GLUCOSE:2,BUN:2,CREATININE:2,CALCIUM:2 in the last 72 hours  Studies/Results: Dg Lumbar Spine 2-3 Views  04/17/2012  *RADIOLOGY REPORT*  Clinical Data: Back pain  DG C-ARM 61-120 MIN,LUMBAR SPINE - 2-3 VIEW  Comparison: None.  Findings: C-arm films document L3-L5 posterolateral and interbody fusion.  Satisfactory position and alignment.  IMPRESSION: As above.   Original Report Authenticated By: Elsie Stain, M.D.    Dg C-arm 61-120 Min  04/17/2012  *RADIOLOGY REPORT*  Clinical Data: Back pain  DG C-ARM 61-120 MIN,LUMBAR SPINE - 2-3 VIEW  Comparison: None.  Findings: C-arm films document L3-L5 posterolateral and interbody fusion.  Satisfactory position and alignment.  IMPRESSION: As above.   Original Report Authenticated By: Elsie Stain, M.D.     Assessment/Plan: Continued physical outpatient therapy working on rehabilitation placement  LOS: 2 days     Shannon Gutierrez 04/19/2012, 8:22 AM

## 2012-04-19 NOTE — Consult Note (Signed)
Physical Medicine and Rehabilitation Consult Reason for Consult: Lumbar stenosis with radiculopathy Referring Physician: Dr. Wynetta Emery   HPI: Shannon Gutierrez is a 66 y.o. right-handed female admitted 04/17/2012 with work related injury to her back and worsening pain radiating to the lower extremities after she fell off of a stool. X-rays and imaging revealed severe spinal stenosis with disc bulge as well as radiculopathy lumbar 3-4-5. No relief with conservative care. Underwent decompressive lumbar laminectomy lumbar 3-4-5 with pedicle screw fixation 04/17/2012 per Dr. Wynetta Emery. Fitted with a back corset applied in the sitting position. Postoperative pain management. Physical occupational therapy evaluations completed and ongoing physical therapy is requested physical medicine rehabilitation consult to consider inpatient rehabilitation services   Review of Systems  Gastrointestinal: Positive for constipation.  Musculoskeletal: Positive for back pain.  Psychiatric/Behavioral: Positive for depression.       Anxiety  All other systems reviewed and are negative.   Past Medical History  Diagnosis Date  . Hypothyroidism   . Anxiety   . Overactive bladder   . Arthritis     osteoarthritis in knees and back  . Degeneration of lumbar intervertebral disc    Past Surgical History  Procedure Date  . Carpal tunnel release     bilateral  . Tonsillectomy   . Abdominal hysterectomy   . Eye surgery   . Breast surgery 1980    augmentation  . Joint replacement   . Hand surgery     CMP joint  . Total thyroidectomy 1980   History reviewed. No pertinent family history. Social History:  reports that she has quit smoking. Her smoking use included Cigarettes. She has a 10 pack-year smoking history. She has never used smokeless tobacco. She reports that she does not drink alcohol or use illicit drugs. Allergies:  Allergies  Allergen Reactions  . Keflex (Cephalexin) Other (See Comments)    Facial swelling  and rash  . Morphine And Related Nausea And Vomiting   Medications Prior to Admission  Medication Sig Dispense Refill  . acyclovir (ZOVIRAX) 400 MG tablet Take 400 mg by mouth 2 (two) times daily as needed. For cold sore flare ups      . estradiol (ESTRACE) 0.5 MG tablet Take 0.5 mg by mouth daily.      Marland Kitchen levothyroxine (SYNTHROID, LEVOTHROID) 88 MCG tablet Take 88 mcg by mouth daily.      . meloxicam (MOBIC) 15 MG tablet Take 15 mg by mouth daily.      . Multiple Vitamin (MULTIVITAMIN WITH MINERALS) TABS Take 1 tablet by mouth daily.      Marland Kitchen omeprazole (PRILOSEC) 20 MG capsule Take 20 mg by mouth daily.      Marland Kitchen PARoxetine (PAXIL) 20 MG tablet Take 20 mg by mouth every morning.        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; nursing triage for dermatology office) Functional Status:  Mobility: Bed Mobility Bed Mobility: Sit to Supine Rolling Right: With rail;5: Supervision (min cues) Right Sidelying to Sit: 4: Min assist;With rails;HOB flat Sitting - Scoot to Edge of Bed: 4: Min assist Sit to Supine: 4: Min assist;With  rail Transfers Transfers: Sit to Stand;Stand to Sit Sit to Stand: 4: Min guard;With upper extremity assist;From bed Stand to Sit: 4: Min guard;With upper extremity assist;To bed Ambulation/Gait Ambulation/Gait Assistance: 4: Min guard Ambulation Distance (Feet): 200 Feet Assistive device: Rolling walker Ambulation/Gait Assistance Details: Cues for posture and safety with RW Gait Pattern: Step-through  pattern;Trunk flexed Gait velocity: significantly decr Stairs: No    ADL: ADL Eating/Feeding: Performed;Set up Where Assessed - Eating/Feeding: Chair Grooming: Performed;Supervision/safety;Teeth care (min cuies for no bending) Where Assessed - Grooming: Supported standing Upper Body Bathing: Performed;Supervision/safety (min cues for no arching) Where Assessed - Upper Body Bathing: Unsupported sitting Lower Body Bathing: Performed;Minimal assistance (with AE:  ) Where Assessed - Lower Body Bathing: Supported sit to stand Upper Body Dressing: Performed;Supervision/safety Where Assessed - Upper Body Dressing: Unsupported sitting Lower Body Dressing: Performed;Minimal assistance (with AE) Where Assessed - Lower Body Dressing: Supported sit to Pharmacist, hospital: Research scientist (life sciences) Method: Sit to Barista: Comfort height toilet Tub/Shower Transfer Method: Not assessed Transfers/Ambulation Related to ADLs: ambulated to bathroom with supervision ADL Comments: pt has AE kit:  educated on this and worked through [employer].  Pt needs min cues for back precautions--for automatic tasks  Cognition: Cognition Arousal/Alertness: Awake/alert Orientation Level: Oriented X4 Cognition Overall Cognitive Status: Appears within functional limits for tasks assessed/performed Arousal/Alertness: Awake/alert Orientation Level: Appears intact for tasks assessed Behavior During Session: [facility] for tasks performed  Blood pressure 90/61, pulse 102, temperature 97.9 F (36.6 C), temperature source Oral, resp. rate 18, height 5\' 3"  (1.6 m), weight 72.576 kg (160 lb), SpO2 95.00%. Physical Exam  Vitals reviewed. Constitutional: She is oriented to person, place, and time. She appears well-developed.  HENT:  Head: Normocephalic.  Eyes:       Pupils round and reactive to light  Neck: Neck supple. No thyromegaly present.  Cardiovascular: Normal rate and regular  rhythm.   Pulmonary/Chest: Breath sounds normal. No respiratory distress. She has no wheezes.  Abdominal: Bowel sounds are normal. She exhibits no distension. There is no tenderness.  Musculoskeletal:       Wearing corset, low back tender as expected  Neurological: She is alert and oriented to person, place, and time. No cranial nerve deficit or sensory deficit.       Follows full commands. Moves all 4's with some pain inhibition weaknes proximally in the legs. dtr's are 1+  Skin:       Back incision dressing clean and dry  Psychiatric: She has a normal mood and affect. Her behavior is normal. Judgment and thought content normal.    No results found for this or any previous visit (from the past 24 hour(s)). No results found.  Assessment/Plan: Diagnosis: lumbar stenosis and spondylosis s/p lami/fixation 1. Does the need for close, 24 hr/day medical supervision in concert with the patient's rehab needs make it unreasonable for this patient to be served in a less intensive setting? Yes 2. Co-Morbidities requiring supervision/potential complications: pain, post-op issues 3. Due to bladder management, bowel management, safety, skin/wound care, disease management, medication administration, pain management and patient education, does the patient require 24 hr/day rehab nursing? Yes 4. Does the patient require coordinated care of a physician, rehab nurse, PT (1-2 hrs/day, 5 days/week) and OT (1-2 hrs/day, 5 days/week) to address physical and functional deficits in the context of the above medical diagnosis(es)? Yes Addressing deficits in the following areas: balance, endurance, locomotion, strength, transferring, bowel/bladder control, bathing, dressing, feeding, grooming and toileting 5. Can the  patient actively participate in an intensive therapy program of at least 3 hrs of therapy per day at least 5 days per week? Yes 6. The potential for patient to make measurable gains while on inpatient rehab  is excellent 7. Anticipated functional outcomes upon discharge from inpatient rehab are mod I with PT, mod I with OT, n./a with SLP. 8. Estimated rehab length of stay to reach the above functional goals is: on  5-7 days 9. Does the patient have adequate social supports to accommodate these discharge functional goals? Yes 10. Anticipated D/C setting: Home 11. Anticipated post D/C treatments: HH therapy 12. Overall Rehab/Functional Prognosis: excellent  RECOMMENDATIONS: This patient's condition is appropriate for continued rehabilitative care in the following setting: CIR Patient has agreed to participate in recommended program. Yes Note that insurance prior authorization may be required for reimbursement for recommended care.  Comment: Pt needs to be modified independent to return home. She should be able to reach that level with a brief inpatient rehab admission.  Ivory Broad, MD    04/19/2012

## 2012-04-19 NOTE — Progress Notes (Signed)
Physical Therapy Treatment Patient Details Name: Shannon Gutierrez MRN: [id number] DOB: 11-09-1945 Today's Date: 04/19/2012 Time: 0947-1000 PT Time Calculation (min): 13 min  PT Assessment / Plan / Recommendation Comments on Treatment Session  Patient ambulating well and progressing with mobility. Continue to recommend SNF as patient lives alone and unable to care for herself at this time.     Follow Up Recommendations  Post acute inpatient     Does the patient have the potential to tolerate intense rehabilitation  Yes, Recommend IP Rehab Screening  Barriers to Discharge        Equipment Recommendations  3 in 1 bedside comode    Recommendations for Other Services    Frequency Min 5X/week   Plan Discharge plan remains appropriate;Frequency remains appropriate    Precautions / Restrictions Precautions Precautions: Back Precaution Comments: Patient able to recall all precautions. Cues to maintain with mobility Required Braces or Orthoses: Spinal Brace Spinal Brace: Lumbar corset;Applied in sitting position Restrictions Weight Bearing Restrictions: No   Pertinent Vitals/Pain     Mobility  Bed Mobility Bed Mobility: Sit to Supine Rolling Right: With rail;5: Supervision (min cues) Sit to Supine: 4: Min assist;With rail Details for Bed Mobility Assistance: A for positioning of LEs. Cues for technique Transfers Sit to Stand: 4: Min guard;With upper extremity assist;From bed Stand to Sit: 4: Min guard;With upper extremity assist;To bed Details for Transfer Assistance: Cues for safe hand placement and not to pull from RW Ambulation/Gait Ambulation/Gait Assistance: 4: Min guard Ambulation Distance (Feet): 200 Feet Assistive device: Rolling walker Ambulation/Gait Assistance Details: Cues for posture and safety with RW Gait Pattern: Step-through pattern;Trunk flexed    Exercises     PT Diagnosis:    PT Problem List:   PT Treatment Interventions:     PT Goals Acute Rehab  PT Goals PT Goal: Sit to Supine/Side - Progress: Progressing toward goal PT Goal: Sit to Stand - Progress: Progressing toward goal PT Goal: Stand to Sit - Progress: Progressing toward goal PT Goal: Ambulate - Progress: Progressing toward goal Additional Goals PT Goal: Additional Goal #1 - Progress: Progressing toward goal  Visit Information  Last PT Received On: 04/19/12 Assistance Needed: +1    Subjective Data      Cognition  Overall Cognitive Status: Appears within functional limits for tasks assessed/performed Arousal/Alertness: Awake/alert Orientation Level: Appears intact for tasks assessed Behavior During Session: [facility] for tasks performed    Balance     End of Session PT - End of Session Equipment Utilized During Treatment: Gait belt;Back brace Activity Tolerance: Patient tolerated treatment well Patient left: in bed;with call bell/phone within reach Nurse Communication: Mobility status   GP     Fredrich Birks 04/19/2012, 2:59 PM  04/19/2012 Fredrich Birks PTA [phone] pager [phone] office

## 2012-04-19 NOTE — Progress Notes (Signed)
Upon ambulation to bathroom, the patient's hemovac drain was pulled loose.    RN removed remaining hemovac and covered hole with 4x4 gauze.   Dressing was dry and intact with minimal drainage after 2 hours.  Scarlette Shorts

## 2012-04-19 NOTE — Progress Notes (Signed)
Called Dr Lonie Peak office and requested an order for a CIR consult. Informed pt's case manager that CIR consult pending. Please call for questions: [phone]

## 2012-04-20 ENCOUNTER — Inpatient Hospital Stay (HOSPITAL_COMMUNITY)
Admission: RE | Admit: 2012-04-20 | Discharge: 2012-04-25 | DRG: 946 | Disposition: A | Discharge status: Home / Self Care | Payer: Worker's Compensation | Source: Ambulatory Visit | Attending: Physical Medicine & Rehabilitation | Admitting: Physical Medicine & Rehabilitation

## 2012-04-20 ENCOUNTER — Encounter (HOSPITAL_COMMUNITY): Payer: Self-pay | Admitting: *Deleted

## 2012-04-20 DIAGNOSIS — IMO0002 Reserved for concepts with insufficient information to code with codable children: Secondary | ICD-10-CM

## 2012-04-20 DIAGNOSIS — Z5189 Encounter for other specified aftercare: Secondary | ICD-10-CM

## 2012-04-20 DIAGNOSIS — Z87891 Personal history of nicotine dependence: Secondary | ICD-10-CM

## 2012-04-20 DIAGNOSIS — M47817 Spondylosis without myelopathy or radiculopathy, lumbosacral region: Secondary | ICD-10-CM | POA: Diagnosis present

## 2012-04-20 DIAGNOSIS — M5416 Radiculopathy, lumbar region: Secondary | ICD-10-CM

## 2012-04-20 DIAGNOSIS — K5900 Constipation, unspecified: Secondary | ICD-10-CM | POA: Diagnosis present

## 2012-04-20 DIAGNOSIS — M48061 Spinal stenosis, lumbar region without neurogenic claudication: Secondary | ICD-10-CM

## 2012-04-20 DIAGNOSIS — E039 Hypothyroidism, unspecified: Secondary | ICD-10-CM | POA: Diagnosis present

## 2012-04-20 DIAGNOSIS — M5126 Other intervertebral disc displacement, lumbar region: Secondary | ICD-10-CM | POA: Diagnosis present

## 2012-04-20 DIAGNOSIS — Z981 Arthrodesis status: Secondary | ICD-10-CM

## 2012-04-20 DIAGNOSIS — K219 Gastro-esophageal reflux disease without esophagitis: Secondary | ICD-10-CM | POA: Diagnosis present

## 2012-04-20 MED ORDER — PAROXETINE HCL 20 MG PO TABS
20.0000 mg | ORAL_TABLET | Freq: Every day | ORAL | Status: DC
  Administered 2012-04-21 – 2012-04-25 (×5): 20 mg via ORAL
  Filled 2012-04-20 (×6): qty 1

## 2012-04-20 MED ORDER — ONDANSETRON HCL 4 MG/2ML IJ SOLN: 4.0000 mg | Freq: Four times a day (QID) | INTRAMUSCULAR | Status: DC | PRN

## 2012-04-20 MED ORDER — PANTOPRAZOLE SODIUM 40 MG PO TBEC
40.0000 mg | DELAYED_RELEASE_TABLET | Freq: Every day | ORAL | Status: DC
  Administered 2012-04-21 – 2012-04-24 (×4): 40 mg via ORAL
  Filled 2012-04-20 (×3): qty 1

## 2012-04-20 MED ORDER — OXYCODONE HCL 5 MG PO TABS
10.0000 mg | ORAL_TABLET | ORAL | Status: DC | PRN
Start: 2012-04-20 — End: 2012-04-21
  Administered 2012-04-21: 10 mg via ORAL
  Filled 2012-04-20 (×2): qty 2

## 2012-04-20 MED ORDER — ONDANSETRON HCL 4 MG PO TABS: 4.0000 mg | ORAL_TABLET | Freq: Four times a day (QID) | ORAL | Status: DC | PRN

## 2012-04-20 MED ORDER — METHOCARBAMOL 500 MG PO TABS
500.0000 mg | ORAL_TABLET | Freq: Four times a day (QID) | ORAL | Status: DC | PRN
  Administered 2012-04-21: 500 mg via ORAL
  Filled 2012-04-20: qty 1

## 2012-04-20 MED ORDER — ALUM & MAG HYDROXIDE-SIMETH 200-200-20 MG/5ML PO SUSP: 30.0000 mL | Freq: Four times a day (QID) | ORAL | Status: DC | PRN

## 2012-04-20 MED ORDER — PHENOL 1.4 % MT LIQD
1.0000 | OROMUCOSAL | Status: DC | PRN
  Filled 2012-04-20: qty 177

## 2012-04-20 MED ORDER — LEVOTHYROXINE SODIUM 88 MCG PO TABS
88.0000 ug | ORAL_TABLET | Freq: Every day | ORAL | Status: DC
  Administered 2012-04-21 – 2012-04-25 (×5): 88 ug via ORAL
  Filled 2012-04-20 (×6): qty 1

## 2012-04-20 MED ORDER — ACETAMINOPHEN 325 MG PO TABS: 325.0000 mg | ORAL_TABLET | ORAL | Status: DC | PRN

## 2012-04-20 MED ORDER — MELOXICAM 15 MG PO TABS
15.0000 mg | ORAL_TABLET | Freq: Every day | ORAL | Status: DC
  Administered 2012-04-21 – 2012-04-25 (×5): 15 mg via ORAL
  Filled 2012-04-20 (×6): qty 1

## 2012-04-20 MED ORDER — ACYCLOVIR 200 MG PO CAPS
400.0000 mg | ORAL_CAPSULE | Freq: Two times a day (BID) | ORAL | Status: DC
  Administered 2012-04-20 – 2012-04-25 (×10): 400 mg via ORAL
  Filled 2012-04-20 (×13): qty 2

## 2012-04-20 MED ORDER — ESTRADIOL 1 MG PO TABS
0.5000 mg | ORAL_TABLET | Freq: Every day | ORAL | Status: DC
  Administered 2012-04-21 – 2012-04-25 (×5): 0.5 mg via ORAL
  Filled 2012-04-20 (×6): qty 0.5

## 2012-04-20 MED ORDER — MENTHOL 3 MG MT LOZG
1.0000 | LOZENGE | OROMUCOSAL | Status: DC | PRN
  Filled 2012-04-20: qty 9

## 2012-04-20 MED ORDER — POLYETHYLENE GLYCOL 3350 17 G PO PACK
17.0000 g | PACK | Freq: Every day | ORAL | Status: DC | PRN
  Administered 2012-04-20: 17 g via ORAL
  Filled 2012-04-20 (×2): qty 1

## 2012-04-20 MED ORDER — SORBITOL 70 % SOLN: 30.0000 mL | Freq: Every day | Status: DC | PRN

## 2012-04-20 MED ORDER — SENNA 8.6 MG PO TABS
1.0000 | ORAL_TABLET | Freq: Two times a day (BID) | ORAL | Status: DC
  Administered 2012-04-20 – 2012-04-25 (×9): 8.6 mg via ORAL
  Filled 2012-04-20 (×12): qty 1

## 2012-04-20 MED ORDER — ADULT MULTIVITAMIN W/MINERALS CH
1.0000 | ORAL_TABLET | Freq: Every day | ORAL | Status: DC
  Administered 2012-04-21 – 2012-04-25 (×5): 1 via ORAL
  Filled 2012-04-20 (×6): qty 1

## 2012-04-20 MED ORDER — PREDNISOLONE ACETATE 1 % OP SUSP
1.0000 [drp] | Freq: Three times a day (TID) | OPHTHALMIC | Status: DC
  Administered 2012-04-20 – 2012-04-21 (×4): 1 [drp] via OPHTHALMIC
  Filled 2012-04-20: qty 1

## 2012-04-20 NOTE — Plan of Care (Signed)
Overall Plan of Care [facility]) Patient Details Name: Shannon Gutierrez MRN: [id number] DOB: 07/05/1946  Diagnosis:  s/p decompressive lumbar laminectomy lumbar 3-4-5 with pedicle screw fixation 04/17/2012 per Dr. Wynetta Emery.   Primary Diagnosis:    Radiculopathy of lumbar region Co-morbidities: R knee arthritis  Functional Problem List  Patient demonstrates impairments in the following areas: Balance, Bowel, Pain and Skin Integrity  Basic ADL's: grooming, bathing, dressing and toileting Advanced ADL's: simple meal preparation and light housekeeping  Transfers:  bed mobility, bed to chair, toilet, tub/shower, car and furniture Locomotion:  ambulation and stairs  Additional Impairments:  Leisure Awareness  Anticipated Outcomes Item Anticipated Outcome  Eating/Swallowing    Basic self-care  Modified Independent  Tolieting  Modified Independent  Bowel/Bladder    Transfers  Mod I  Locomotion  Mod i with LRAD possibly RW vs none, no WC goals  Communication    Cognition    Pain  3 or less  Safety/Judgment  Mod Independent  Other     Therapy Plan: PT Frequency: 2-3 X/day, 60-90 minutes OT Frequency: 1-2 X/day, 60-90 minutes     Team Interventions: Item RN PT OT SLP SW TR Other  Self Care/Advanced ADL Retraining  x x      Neuromuscular Re-Education  x x      Therapeutic Activities  x x      UE/LE Strength Training/ROM  x x      UE/LE Coordination Activities  x x      Visual/Perceptual Remediation/Compensation   x      DME/Adaptive Equipment Instruction  x x      Therapeutic Exercise  x x      Balance/Vestibular Training  x x      Patient/Family Education x x x      Cognitive Remediation/Compensation         Functional Mobility Training  x x      Ambulation/Gait Training  x x      Stair Training  x       Teacher, early years/pre Reintegration  x x      Dysphagia/Aspiration Sales executive         Bladder Management         Bowel Management         Disease Management/Prevention x        Pain Management x x x      Medication Management         Skin Care/Wound Management x  x      Splinting/Orthotics x x x      Discharge Planning  x x      Psychosocial Support  x x                         Team Discharge Planning: Destination:  Home Projected Follow-up:  no OT follow up recommended at this time, no PT f/u recommended at this time Projected Equipment Needs:  Bedside Commode, Shower Seat and possibly RW vs none, depends on progress Patient/family involved in discharge planning:  Yes  MD ELOS: 5-7 days Medical Rehab Prognosis:  Excellent Assessment: the patient was admitted for CIR therapies.the team will be addressing self-care, brace wear, pain mgt, fxnl mobility. Goals are mod I

## 2012-04-20 NOTE — Progress Notes (Signed)
Subjective: Patient reports No leg pain back pain is well controlled on the oral  analgesics  Objective: Vital signs in last 24 hours: Temp:  [97.5 F (36.4 C)-98.6 F (37 C)] 97.7 F (36.5 C) (10/17 0600) Pulse Rate:  [79-102] 79  (10/17 0600) Resp:  [18-20] 18  (10/17 0600) BP: (90-115)/(32-61) 112/61 mmHg (10/17 0600) SpO2:  [95 %-98 %] 98 % (10/17 0600)  Intake/Output from previous day: 10/16 0701 - 10/17 0700 In: -  Out: 100 [Drains:100] Intake/Output this shift:    Strength out of 5 wound clean and dry  Lab Results: No results found for this basename: WBC:2,HGB:2,HCT:2,PLT:2 in the last 72 hours BMET No results found for this basename: NA:2,K:2,CL:2,CO2:2,GLUCOSE:2,BUN:2,CREATININE:2,CALCIUM:2 in the last 72 hours  Studies/Results: No results found.  Assessment/Plan: Continued continued and progressive mobilization patient stable for transfer to rehabilitation or a skilled nursing facility whenever a bed is available  LOS: 3 days     Shannon Gutierrez P 04/20/2012, 7:38 AM

## 2012-04-20 NOTE — H&P (Signed)
Physical Medicine and Rehabilitation Admission H&P    No chief complaint on file. : HPI: Shannon Gutierrez is a 66 y.o. right-handed female admitted 04/17/2012 with work related injury to her back and worsening pain radiating to the lower extremities after she fell off of a stool. X-rays and imaging revealed severe spinal stenosis with disc bulge as well as radiculopathy lumbar 3-4-5. No relief with conservative care. Underwent decompressive lumbar laminectomy lumbar 3-4-5 with pedicle screw fixation 04/17/2012 per Dr. Wynetta Emery. Fitted with a back corset applied in the sitting position. Postoperative pain management. Bouts of constipation reported and adjusting bowel program. Physical occupational therapy evaluations completed and ongoing physical therapy is requested physical medicine rehabilitation consult to consider inpatient rehabilitation services. Patient was felt to be a good candidate for inpatient rehabilitation services and was admitted for comprehensive rehabilitation program Patient has had drain removed.  Review of Systems  Gastrointestinal: Positive for constipation.  Musculoskeletal: Positive for back pain. Joint pain Psychiatric/Behavioral: Positive for depression.  Anxiety  Genitourinary. Overactive bladder All other systems reviewed and are negative   Past Medical History  Diagnosis Date  . Hypothyroidism   . Anxiety   . Overactive bladder   . Arthritis     osteoarthritis in knees and back  . Degeneration of lumbar intervertebral disc    Past Surgical History  Procedure Date  . Carpal tunnel release     bilateral  . Tonsillectomy   . Abdominal hysterectomy   . Eye surgery   . Breast surgery 1980    augmentation  . Joint replacement   . Hand surgery     CMP joint  . Total thyroidectomy 1980   No family history on file. Social History:  reports that she has quit smoking. Her smoking use included Cigarettes. She has a 10 pack-year smoking history. She has never  used smokeless tobacco. She reports that she does not drink alcohol or use illicit drugs. Allergies:  Allergies  Allergen Reactions  . Keflex (Cephalexin) Other (See Comments)    Facial swelling and rash  . Morphine And Related Nausea And Vomiting   Medications Prior to Admission  Medication Sig Dispense Refill  . acyclovir (ZOVIRAX) 400 MG tablet Take 400 mg by mouth 2 (two) times daily as needed. For cold sore flare ups      . estradiol (ESTRACE) 0.5 MG tablet Take 0.5 mg by mouth daily.      Marland Kitchen levothyroxine (SYNTHROID, LEVOTHROID) 88 MCG tablet Take 88 mcg by mouth daily.      . meloxicam (MOBIC) 15 MG tablet Take 15 mg by mouth daily.      . Multiple Vitamin (MULTIVITAMIN WITH MINERALS) TABS Take 1 tablet by mouth daily.      Marland Kitchen omeprazole (PRILOSEC) 20 MG capsule Take 20 mg by mouth daily.      Marland Kitchen PARoxetine (PAXIL) 20 MG tablet Take 20 mg by mouth every morning.        Home:     Functional History:    Functional Status:  Mobility:          ADL:    Cognition:       There were no vitals taken for this visit. Physical Exam  Vitals reviewed.  Constitutional: She is oriented to person, place, and time. She appears well-developed.  HENT:  Head: Normocephalic.  Eyes:  Pupils round and reactive to light  Neck: Neck supple. No thyromegaly present.  Cardiovascular: Normal rate and regular rhythm.  Pulmonary/Chest: Breath sounds normal. No  respiratory distress. She has no wheezes.  Abdominal: Bowel sounds are normal. She exhibits no distension. There is no tenderness.  Musculoskeletal:  Wearing corset, low back tender as expected  Neurological: She is alert and oriented to person, place, and time. No cranial nerve deficit or sensory deficit.  Follows full commands. Moves all 4's with some pain inhibition weaknes proximally in the legs. dtr's are 1+  Skin:  Back incision dressing clean and dry  Psychiatric: She has a normal mood and affect. Her behavior is normal.  Judgment and thought content normal.  Motor:5/5 in B Delt Bi Tri Grip, 4/5 in B HF,KN, Ankle DF/PF Sensation intact in both LE and UEs Tone is normal    No results found for this or any previous visit (from the past 48 hour(s)). No results found.  Post Admission Physician Evaluation: 1. Functional deficits secondary  to Lumbar spinal stenosis and radiculopathy s/p decompression and fusion L3-4-5,POD#3. 2. Patient is admitted to receive collaborative, interdisciplinary care between the physiatrist, rehab nursing staff, and therapy team. 3. Patient's level of medical complexity and substantial therapy needs in context of that medical necessity cannot be provided at a lesser intensity of care such as a SNF. 4. Patient has experienced substantial functional loss from his/her baseline which was documented above under the "Functional History" and "Functional Status" headings.  Judging by the patient's diagnosis, physical exam, and functional history, the patient has potential for functional progress which will result in measurable gains while on inpatient rehab.  These gains will be of substantial and practical use upon discharge  in facilitating mobility and self-care at the household level. 5. Physiatrist will provide 24 hour management of medical needs as well as oversight of the therapy plan/treatment and provide guidance as appropriate regarding the interaction of the two. 6. 24 hour rehab nursing will assist with bladder management, bowel management, safety, skin/wound care, disease management, medication administration, pain management and patient education  and help integrate therapy concepts, techniques,education, etc. 7. PT will assess and treat for:  Pre gait, gait, safety , endurance , equipment.  Goals are: Mod I mobility. 8. OT will assess and treat for: ADL, safety endurance , equipment.   Goals are: Mod I ADLs. 9. SLP will assess and treat for: NA.  Goals are: NA. 10. Case Management  and Social Worker will assess and treat for psychological issues and discharge planning. 11. Team conference will be held weekly to assess progress toward goals and to determine barriers to discharge. 12. Patient will receive at least 3 hours of therapy per day at least 5 days per week. 13. ELOS: 7 days      Prognosis:  excellent   Medical Problem List and Plan: 1. Lumbar stenosis/spondylosis/radiculopathy. Status post decompressive lumbar laminectomy L3-4-5 with pedicle screw fixation 04/17/2012 2. DVT Prophylaxis/Anticoagulation: SCDs. Monitor for any signs of DVT 3. Pain Management: Oxycodone and Robaxin as needed. Mobic 15 mg daily. Monitor with increased mobility 4. Mood: Paxil 20 mg daily. Provide emotional support and positive reinforcement 5. Neuropsych: This patient is capable of making decisions on his/her own behalf. 6. Hypothyroid. Synthroid 7. Constipation. Adjust bowel program as needed 8. History of overactive bladder. Check PVRs x3 9. GERD. Protonix 04/20/2012, 3:14 PM

## 2012-04-20 NOTE — Progress Notes (Signed)
Pt admitted to rehab at 1455 in wheelchair. Pt had family at bedside. Reviewed rehab process and discussed safety plan with pt and family with pt signing safety sheet. Belongings at bedside. Pt in recliner with call bell in reach.

## 2012-04-20 NOTE — PMR Pre-admission (Signed)
PMR Admission Coordinator Pre-Admission Assessment  Patient: Shannon Gutierrez is an 66 y.o., female MRN: [id number] DOB: 16-Jun-1946 Height: 5\' 3"  (160 cm) Weight: 72.576 kg (160 lb)  Insurance Information HMO:      PPO:       PCP:       IPA:       80/20:       OTHER:   PRIMARY: Workers Comp      Policy#: [id number]      Subscriber: [name] CM Name: Orson Slick      Phone#: [phone] or [phone]    Fax#: [phone] Pre-Cert#: Claim # [id number]      Employer: [employer] Benefits:  Phone #:       Name:   [name]. Date: Date of injury 04/23/11     Deduct:        Out of Pocket Max:        Life Max:   CIR:        SNF:   Outpatient:       Co-Pay:   Home Health:        Co-Pay:   DME:  Call progressive [phone] and fax script       Co-Pay:   Providers: [facility] Group Adjuster Alla Feeling - [phone]  Emergency Contact Information Contact Information    Name Relation Home Work [place] Jr,Odie Son   [phone]   Parrish,Amber Daughter [phone]  [phone]   [name], Langer [phone]  [phone]     Current Medical History  Patient Admitting Diagnosis: lumbar stenosis and spondylosis s/p lami/fixation   History of Present Illness  :A 66 y.o. right-handed female admitted 04/17/2012 with work related injury to her back and worsening pain radiating to the lower extremities after she fell off of a stool. X-rays and imaging revealed severe spinal stenosis with disc bulge as well as radiculopathy lumbar 3-4-5. No relief with conservative care. Underwent decompressive lumbar laminectomy lumbar 3-4-5 with pedicle screw fixation 04/17/2012 per Dr. Wynetta Emery. Fitted with a back corset applied in the sitting position. Postoperative pain management.  Past Medical History  Past Medical History  Diagnosis Date  . Hypothyroidism   . Anxiety   . Overactive bladder   . Arthritis     osteoarthritis in knees and back  . Degeneration of lumbar  intervertebral disc    Family History  family history is not on file.  Prior Rehab/Hospitalizations:  No previous rehab.   Current Medications  Current facility-administered medications:0.9 %  sodium chloride infusion, 250 mL, Intravenous, Continuous, Mariam Dollar, MD;  acetaminophen (TYLENOL) suppository 650 mg, 650 mg, Rectal, Q4H PRN, Mariam Dollar, MD;  acetaminophen (TYLENOL) tablet 650 mg, 650 mg, Oral, Q4H PRN, Mariam Dollar, MD, 650 mg at 04/19/12 2142;  acyclovir (ZOVIRAX) 200 MG capsule 400 mg, 400 mg, Oral, BID, Mariam Dollar, MD, 400 mg at 04/19/12 2136 alum & mag hydroxide-simeth (MAALOX/MYLANTA) 200-200-20 MG/5ML suspension 30 mL, 30 mL, Oral, Q6H PRN, Mariam Dollar, MD;  cyclobenzaprine (FLEXERIL) tablet 10 mg, 10 mg, Oral, TID PRN, Mariam Dollar, MD, 10 mg at 04/17/12 1736;  docusate sodium (COLACE) capsule 100 mg, 100 mg, Oral, BID, Mariam Dollar, MD, 100 mg at 04/19/12 2136;  estradiol (ESTRACE) tablet 0.5 mg, 0.5 mg, Oral, Daily, Mariam Dollar, MD, 0.5 mg at 04/19/12 1106 HYDROmorphone (DILAUDID) injection 0.5-1 mg, 0.5-1 mg, Intravenous, Q2H PRN, Mariam Dollar, MD;  influenza  inactive virus vaccine (FLUZONE/FLUARIX)  injection 0.5 mL, 0.5 mL, Intramuscular, Prior to discharge, Mariam Dollar, MD;  levothyroxine (SYNTHROID, LEVOTHROID) tablet 88 mcg, 88 mcg, Oral, QAC breakfast, Mariam Dollar, MD, 88 mcg at 04/20/12 0757;  meloxicam (MOBIC) tablet 15 mg, 15 mg, Oral, Daily, Mariam Dollar, MD, 15 mg at 04/19/12 1105 menthol-cetylpyridinium (CEPACOL) lozenge 3 mg, 1 lozenge, Oral, PRN, Mariam Dollar, MD;  multivitamin with minerals tablet 1 tablet, 1 tablet, Oral, Daily, Mariam Dollar, MD, 1 tablet at 04/19/12 1104;  ondansetron (ZOFRAN) injection 4 mg, 4 mg, Intravenous, Q4H PRN, Mariam Dollar, MD;  oxyCODONE (Oxy IR/ROXICODONE) immediate release tablet 10 mg, 10 mg, Oral, Q4H PRN, Mariam Dollar, MD, 10 mg at 04/19/12 2009 pantoprazole (PROTONIX) EC tablet 40 mg, 40 mg, Oral, Q1200, Mariam Dollar, MD, 40 mg at 04/19/12  1107;  PARoxetine (PAXIL) tablet 20 mg, 20 mg, Oral, Daily, Mariam Dollar, MD, 20 mg at 04/19/12 1105;  phenol (CHLORASEPTIC) mouth spray 1 spray, 1 spray, Mouth/Throat, PRN, Mariam Dollar, MD;  prednisoLONE acetate (PRED FORTE) 1 % ophthalmic suspension 1 drop, 1 drop, Both Eyes, TID, Mariam Dollar, MD, 1 drop at 04/19/12 2137 sodium chloride 0.9 % injection 3 mL, 3 mL, Intravenous, Q12H, Mariam Dollar, MD, 3 mL at 04/19/12 2137;  sodium chloride 0.9 % injection 3 mL, 3 mL, Intravenous, PRN, Mariam Dollar, MD  Patients Current Diet: General  Precautions / Restrictions Precautions Precautions: Back Precaution Booklet Issued: Yes (comment) Precaution Comments: Patient able to recall all precautions, needing cues to follow with mobility Spinal Brace: Lumbar corset;Applied in sitting position Restrictions Weight Bearing Restrictions: No   Prior Activity Level Community (5-7x/wk): Went out 2 X a week to run errands.  Prior to 05/13, she worked [employer] and went out daily.   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; nursing triage for dermatology office)  Current Functional Level Cognition  Arousal/Alertness: Awake/alert Overall Cognitive Status: Appears within functional limits for tasks assessed/performed Orientation Level: Oriented X4    Extremity Assessment (includes Sensation/Coordination)  RUE ROM/Strength/Tone: WFL for tasks assessed  RLE ROM/Strength/Tone: [facility] for tasks assessed    ADLs  Eating/Feeding: Performed;Set up Where Assessed - Eating/Feeding: Chair Grooming: Performed;Supervision/safety;Teeth care (min cuies for no bending) Where Assessed - Grooming: Supported standing Upper Body Bathing: Performed;Supervision/safety (min cues for no arching) Where  Assessed - Upper Body Bathing: Unsupported sitting Lower Body Bathing: Performed;Minimal assistance (with AE:  ) Where Assessed - Lower Body Bathing: Supported sit to stand Upper Body Dressing: Performed;Supervision/safety Where Assessed - Upper Body Dressing: Unsupported sitting Lower Body Dressing: Performed;Minimal assistance (with AE) Where Assessed - Lower Body Dressing: Supported sit to Pharmacist, hospital: Research scientist (life sciences) Method: Sit to Barista: Comfort height toilet Toileting - Architect and Hygiene: Performed;Minimal assistance Where Assessed - Engineer, mining and Hygiene: Standing Tub/Shower Transfer Method: Not assessed Transfers/Ambulation Related to ADLs: ambulated to bathroom with supervision ADL Comments: pt has AE kit:  educated on this and worked through [employer].  Pt needs min cues for back precautions--for automatic tasks    Mobility  Bed Mobility: Sit to Supine Rolling Right: 5: Supervision;With rail Right Sidelying to Sit: 5: Supervision;With rails Sitting - Scoot to Edge of Bed: 5: Supervision Sit to Supine: 5: Supervision;With rail  Transfers  Transfers: Sit to Stand;Stand to Sit Sit to Stand: 5: Supervision;From bed Stand to Sit: 5: Supervision;To bed    Ambulation / Gait / Stairs / Wheelchair Mobility  Ambulation/Gait Ambulation/Gait Assistance: 4: Min guard Ambulation Distance (Feet): 600 Feet Assistive device: Rolling walker Ambulation/Gait Assistance Details: Cues for safety with RW and to avoid obstacles.  Gait Pattern: Step-through pattern;Decreased stride length Gait velocity: significantly decr Stairs: No    Posture / Balance       Previous Home Environment Living Arrangements: Alone Lives With: Alone Available Help at Discharge: Family (2 sons and daughter can help with driving/groceries) Type of Home: Other (Comment) (townhouse) Home Layout: One level Home  Access: Level entry Bathroom Shower/Tub: Health visitor: Standard Bathroom Accessibility: Yes How Accessible: Accessible via walker Home Care Services: No  Discharge Living Setting Plans for Discharge Living Setting: Patient's home;Alone;Other (Comment) (Lives in a townhouse.) Type of Home at Discharge: House Discharge Home Layout: One level Discharge Home Access: Level entry Do you have any problems obtaining your medications?: No  Social/Family/Support Systems Patient Roles: Parent (Has 1 Dtr and 2 sons locally.  Is divorced.) Contact Information: Kizzy Hurtig - son [phone]; Kipp Laurence - Dtr [phone]; Lyric Honnold - son [phone] Anticipated Caregiver: self Ability/Limitations of Caregiver: Children are local and they work.  They can run errands and check on patient. Caregiver Availability: Intermittent Discharge Plan Discussed with Primary Caregiver: Yes Is Caregiver In Agreement with Plan?: Yes Does Caregiver/Family have Issues with Lodging/Transportation while Pt is in Rehab?: No  Goals/Additional Needs Patient/Family Goal for Rehab: PT/OT mod I goals, no ST needs Expected length of stay: 5-7 days Cultural Considerations: [employer] Dietary Needs: Regular diet Equipment Needs: TBD Pt/Family Agrees to Admission and willing to participate: Yes Program Orientation Provided & Reviewed with Pt/Caregiver Including Roles  & Responsibilities: Yes  Patient Condition: This patient's condition remains as documented in the Consult dated 04/20/12, in which the Rehabilitation Physician determined and documented that the patient's condition is appropriate for intensive rehabilitative care in an inpatient rehabilitation facility.  Preadmission Screen Completed By:  Trish Mage, 04/20/2012 11:14 AM ______________________________________________________________________   Discussed status with Dr. Riley Kill on 04/20/12 at 1131 and received telephone approval for  admission today.  Admission Coordinator:  Trish Mage, time1134/Date10/17/13

## 2012-04-20 NOTE — Progress Notes (Signed)
Physical Therapy Treatment Patient Details Name: Shannon Gutierrez MRN: [id number] DOB: [birth year] Today's Date: 04/20/2012 Time: 1000-1018 PT Time Calculation (min): 18 min  PT Assessment / Plan / Recommendation Comments on Treatment Session  Patient continues to progress well with mobility. Patient requiring cues for precautions and use of brace as she had it on upside down initially. Patient with concerns over bathing and dressing. OT made aware.     Follow Up Recommendations  Post acute inpatient     Does the patient have the potential to tolerate intense rehabilitation     Barriers to Discharge        Equipment Recommendations  3 in 1 bedside comode;Rolling walker with 5" wheels    Recommendations for Other Services    Frequency Min 5X/week   Plan Discharge plan remains appropriate;Frequency remains appropriate    Precautions / Restrictions Precautions Precautions: Back Precaution Comments: Patient able to recall all precautions, needing cues to follow with mobility Spinal Brace: Lumbar corset;Applied in sitting position   Pertinent Vitals/Pain     Mobility  Bed Mobility Rolling Right: 5: Supervision;With rail Right Sidelying to Sit: 5: Supervision;With rails Sitting - Scoot to Edge of Bed: 5: Supervision Sit to Supine: 5: Supervision;With rail Details for Bed Mobility Assistance: Cues for positioning Transfers Sit to Stand: 5: Supervision;From bed Stand to Sit: 5: Supervision;To bed Ambulation/Gait Ambulation/Gait Assistance: 4: Min guard Ambulation Distance (Feet): 600 Feet Assistive device: Rolling walker Ambulation/Gait Assistance Details: Cues for safety with RW and to avoid obstacles.  Gait Pattern: Step-through pattern;Decreased stride length    Exercises     PT Diagnosis:    PT Problem List:   PT Treatment Interventions:     PT Goals Acute Rehab PT Goals PT Goal: Rolling Supine to Right Side - Progress: Progressing toward goal PT Goal:  Supine/Side to Sit - Progress: Progressing toward goal PT Goal: Sit to Supine/Side - Progress: Progressing toward goal PT Goal: Sit to Stand - Progress: Progressing toward goal PT Goal: Stand to Sit - Progress: Progressing toward goal PT Goal: Ambulate - Progress: Progressing toward goal Additional Goals PT Goal: Additional Goal #1 - Progress: Progressing toward goal  Visit Information  Last PT Received On: 04/20/12 Assistance Needed: +1    Subjective Data      Cognition  Overall Cognitive Status: Appears within functional limits for tasks assessed/performed Arousal/Alertness: Awake/alert Orientation Level: Appears intact for tasks assessed Behavior During Session: [facility] for tasks performed    Balance     End of Session PT - End of Session Equipment Utilized During Treatment: Gait belt;Back brace Activity Tolerance: Patient tolerated treatment well Patient left: in bed;with call bell/phone within reach Nurse Communication: Mobility status   GP     Fredrich Birks 04/20/2012, 10:25 AM 04/20/2012 Fredrich Birks PTA [phone] pager [phone] office

## 2012-04-20 NOTE — Progress Notes (Signed)
Rehab admissions - Evaluated for possible admission.  I spoke with patient who would like to come to inpatient rehab.  I have approval from workers comp Sports coach to admit to inpatient rehab.  Bed available and will admit to inpatient rehab today.  Call me for questions.  #[phone]

## 2012-04-21 ENCOUNTER — Inpatient Hospital Stay (HOSPITAL_COMMUNITY): Payer: Worker's Compensation | Admitting: Occupational Therapy

## 2012-04-21 ENCOUNTER — Inpatient Hospital Stay (HOSPITAL_COMMUNITY): Payer: Self-pay | Admitting: Physical Therapy

## 2012-04-21 DIAGNOSIS — Z5189 Encounter for other specified aftercare: Secondary | ICD-10-CM

## 2012-04-21 DIAGNOSIS — M48061 Spinal stenosis, lumbar region without neurogenic claudication: Secondary | ICD-10-CM

## 2012-04-21 LAB — COMPREHENSIVE METABOLIC PANEL
ALT: 21 U/L (ref 0–35)
AST: 27 U/L (ref 0–37)
Albumin: 2.8 g/dL — ABNORMAL LOW (ref 3.5–5.2)
Alkaline Phosphatase: 64 U/L (ref 39–117)
Chloride: 107 mEq/L (ref 96–112)
Potassium: 4.4 mEq/L (ref 3.5–5.1)
Sodium: 142 mEq/L (ref 135–145)
Total Protein: 5.9 g/dL — ABNORMAL LOW (ref 6.0–8.3)

## 2012-04-21 LAB — CBC WITH DIFFERENTIAL/PLATELET
Basophils Relative: 0 % (ref 0–1)
Eosinophils Absolute: 0.3 10*3/uL (ref 0.0–0.7)
MCH: 28.6 pg (ref 26.0–34.0)
MCHC: 32.9 g/dL (ref 30.0–36.0)
Neutro Abs: 7.5 10*3/uL (ref 1.7–7.7)
Neutrophils Relative %: 67 % (ref 43–77)
Platelets: 257 10*3/uL (ref 150–400)
RBC: 3.71 MIL/uL — ABNORMAL LOW (ref 3.87–5.11)

## 2012-04-21 MED ORDER — OXYCODONE HCL 5 MG PO TABS
5.0000 mg | ORAL_TABLET | ORAL | Status: DC | PRN
  Administered 2012-04-23 – 2012-04-25 (×3): 5 mg via ORAL
  Filled 2012-04-21 (×2): qty 1
  Filled 2012-04-21: qty 2

## 2012-04-21 NOTE — Evaluation (Signed)
Occupational Therapy Assessment and Plan & Session Notes  Patient Details  Name: Shannon Gutierrez MRN: [id number] Date of Birth: [birth year]  OT Diagnosis: acute pain, lumbago (low back pain) and muscle weakness (generalized) Rehab Potential: Rehab Potential: Excellent ELOS: 5-7 days   Today's Date: 04/21/2012  ASSESSMENT AND PLAN  Problem List:  Patient Active Problem List  Diagnosis  . Radiculopathy of lumbar region    Past Medical History:  Past Medical History  Diagnosis Date  . Hypothyroidism   . Anxiety   . Overactive bladder   . Arthritis     osteoarthritis in knees and back  . Degeneration of lumbar intervertebral disc    Past Surgical History:  Past Surgical History  Procedure Date  . Carpal tunnel release     bilateral  . Tonsillectomy   . Abdominal hysterectomy   . Eye surgery   . Breast surgery 1980    augmentation  . Joint replacement   . Hand surgery     CMP joint  . Total thyroidectomy 1980   Clinical Impression: Shannon Gutierrez is a 66 y.o. right-handed female admitted 04/17/2012 with work related injury to her back and worsening pain radiating to the lower extremities after she fell off of a stool. X-rays and imaging revealed severe spinal stenosis with disc bulge as well as radiculopathy lumbar 3-4-5. No relief with conservative care. Underwent decompressive lumbar laminectomy lumbar 3-4-5 with pedicle screw fixation 04/17/2012 per Dr. Wynetta Emery. Fitted with a back corset applied in the sitting position. Postoperative pain management. Bouts of constipation reported and adjusting bowel program. Physical occupational therapy evaluations completed and ongoing physical therapy is requested physical medicine rehabilitation consult to consider inpatient rehabilitation services. Patient was felt to be a good candidate for inpatient rehabilitation services and was admitted for comprehensive rehabilitation program  Patient has had drain removed. Patient transferred to  [facility] on 04/20/2012 .    Patient currently requires supervision with basic self-care skills and IADL secondary to muscle weakness and decreased sitting balance, decreased standing balance, decreased postural control, decreased balance strategies and difficulty maintaining precautions.  Prior to hospitalization, patient could complete ADLs and IADLs independently.   Patient will benefit from skilled intervention to increase independence with basic self-care skills prior to discharge home independently.  Anticipate patient will require intermittent supervision and no further OT follow recommended.  OT - End of Session Activity Tolerance: Tolerates 30+ min activity without fatigue Endurance Deficit: No OT Assessment Rehab Potential: Excellent Barriers to Discharge: None (none known at this time) OT Plan OT Frequency: 1-2 X/day, 60-90 minutes Estimated Length of Stay: 5-7 days OT Treatment/Interventions: Balance/vestibular training;Community reintegration;Discharge planning;DME/adaptive equipment instruction;Functional mobility training;Pain management;Patient/family education;Psychosocial support;Self Care/advanced ADL retraining;Skin care/wound managment;Splinting/orthotics;Therapeutic Activities;Therapeutic Exercise;UE/LE Strength taining/ROM;UE/LE Coordination activities OT Recommendation Follow Up Recommendations: None (none at this time) Equipment Recommended: 3 in 1 bedside comode;Tub/shower seat  Precautions/Restrictions  Precautions Precautions: Back;Fall Precaution Comments: patient able to recall 3/3 back precautions Required Braces or Orthoses: Spinal Brace Spinal Brace: Lumbar corset;Applied in sitting position Restrictions Weight Bearing Restrictions: No  General Chart Reviewed: Yes Family/Caregiver Present: No  Vital Signs Therapy Vitals Temp: 98.6 F (37 C) Temp src: Oral Pulse Rate: 84  Resp: 18  BP: 118/64 mmHg Patient Position, if appropriate: Lying Oxygen  Therapy SpO2: 97 % O2 Device: None (Room air)  Pain Pain Assessment Pain Assessment: No/denies pain Pain Score: 0-No pain  Home Living/Prior Functioning Home Living Lives With: Alone Available Help at Discharge: Family (2 sons and daughter  will be available at d/c) Type of Home: Other (Comment) (townhouse) Home Access: Level entry Home Layout: One level Bathroom Shower/Tub: Walk-in Contractor: Standard Bathroom Accessibility: Yes How Accessible: Accessible via walker Home Adaptive Equipment: None;Sock aid;Reacher;Built-in shower seat;Long-handled sponge;Long-handled shoehorn IADL History Homemaking Responsibilities: Yes Meal Prep Responsibility: Primary Laundry Responsibility: Primary Cleaning Responsibility: Primary Bill Paying/Finance Responsibility: Primary Shopping Responsibility: Primary Child Care Responsibility: Secondary Current License: Yes Occupation: Part time employment (4 days a week) Prior Function Level of Independence: Independent with basic ADLs;Independent with homemaking with ambulation;Independent with gait;Independent with transfers Driving: Yes Vocation: Full time employment (but out of work since may)  ADL - See FIM  Vision/Perception  Vision - History Baseline Vision: Wears glasses all the time Patient Visual Report: No change from baseline Vision - Assessment Eye Alignment: Within Functional Limits Perception Perception: Within Functional Limits Praxis Praxis: Intact   Cognition Overall Cognitive Status: Appears within functional limits for tasks assessed Arousal/Alertness: Awake/alert Orientation Level: Oriented X4 Memory: Appears intact Awareness: Appears intact Problem Solving: Appears intact Safety/Judgment: Appears intact  Sensation Sensation Additional Comments: appears intact on bilateral UEs Coordination Gross Motor Movements are Fluid and Coordinated: Yes Fine Motor Movements are Fluid and  Coordinated: Yes  Motor - See Evaluation Navigator  Mobility - See Evaluation Navigator  Trunk/Postural Assessment - See Evaluation Navigator  Balance- See Evaluation Navigator  Extremity/Trunk Assessment RUE Assessment RUE Assessment: Within Functional Limits (can benefit from strengthening) LUE Assessment LUE Assessment: Within Functional Limits (can benefit from strengthening)  See FIM for current functional status  Refer to Care Plan for Long Term Goals  Recommendations for other services: None  Discharge Criteria: Patient will be discharged from OT if patient refuses treatment 3 consecutive times without medical reason, if treatment goals not met, if there is a change in medical status, if patient makes no progress towards goals or if patient is discharged from hospital.  The above assessment, treatment plan, treatment alternatives and goals were discussed and mutually agreed upon: by patient   ---------------------------------------------------------------------------------------------------------   SESSION NOTES  Session #1 [phone] - 60 Minutes Individual Therapy No complaints of pain Initial 1:1 occupational therapy evaluation completed. Skilled intervention focusing on functional mobility throughout room using rolling walker, shower stall transfer onto tub transfer bench, UB/LB bathing at shower level, UB/LB dressing in sit->stand position from bed, overall activity tolerance/endurance, and grooming tasks standing at sink. At end of session left patient seated in recliner with call bell & phone left within reach.   Session #2 [id number] - 30 Minutes Individual Therapy No complaints of pain Patient found seated in recliner upon entering room. Patient ambulated from room -> ADL apartment using rolling walker with distant supervision. Patient then engaged in [facility] transfer with supervision. Patient also engaged in bed transfer & bed mobility and simulated walk-in shower  stall transfer on/off shower seat. During session educated patient on usage of reacher to help increase independence, energy conservation techniques & importance, ADL transfers, usage of grab bars & hand-held shower during showering, and overall safety with use of rolling walker. Educated, demonstrated, and had patient return demonstrate strengthening exercises with use of green theraband. At end of session left patient seated in recliner with call bell & phone within reach.   Seraphim Affinito 04/21/2012, 8:45 AM

## 2012-04-21 NOTE — Care Management Note (Signed)
[facility]patient [facility] Individual Statement of Services  Patient Name:  Shannon Gutierrez  Date:  04/21/2012  Welcome to the [facility]patient [facility].  Our goal is to provide you with an individualized program based on your diagnosis and situation, designed to meet your specific needs.  With this comprehensive rehabilitation program, you will be expected to participate in at least 3 hours of rehabilitation therapies Monday-Friday, with modified therapy programming on the weekends.  Your rehabilitation program will include the following services:  Physical Therapy (PT), Occupational Therapy (OT), 24 hour per day rehabilitation nursing, Case Management (RN and Social Worker), Rehabilitation Medicine, Nutrition Services and Pharmacy Services  Weekly team conferences will be held on Tuesday to discuss your progress.  Your RN Case Designer, television/film set will talk with you frequently to get your input and to update you on team discussions.  Team conferences with you and your family in attendance may also be held.  Expected length of stay: 5-7 days  Overall anticipated outcome: mod/i level  Depending on your progress and recovery, your program may change.  Your RN Case Estate agent will coordinate services and will keep you informed of any changes.  Your RN Sports coach and SW names and contact numbers are listed  below.  The following services may also be recommended but are not provided by the Inpatient [facility]:   Driving Evaluations  Home Health Rehabiltiation Services  Outpatient Rehabilitatation [facility]  Vocational Rehabilitation   Arrangements will be made to provide these services after discharge if needed.  Arrangements include referral to agencies that provide these services.  Your insurance has been verified to be:  Workers Comp Your primary doctor is:  Dr. Doreatha Martin  Pertinent information will be shared with your doctor and your  insurance company.    Social Worker:  Dossie Der, [place] [phone]  Information discussed with and copy given to patient by: Lucy Chris, 04/21/2012, 9:28 AM

## 2012-04-21 NOTE — Progress Notes (Signed)
Subjective/Complaints: Had a good night. Slept well. Pain under control  Objective: Vital Signs: Blood pressure 118/64, pulse 84, temperature 98.6 F (37 C), temperature source Oral, resp. rate 18, height 5\' 3"  (1.6 m), weight 72 kg (158 lb 11.7 oz), SpO2 97.00%. No results found. No results found for this basename: WBC:2,HGB:2,HCT:2,PLT:2 in the last 72 hours No results found for this basename: NA:2,K:2,CL:2,CO2:2,GLUCOSE:2,BUN:2,CREATININE:2,CALCIUM:2 in the last 72 hours CBG (last 3)  No results found for this basename: GLUCAP:3 in the last 72 hours  Wt Readings from Last 3 Encounters:  04/20/12 72 kg (158 lb 11.7 oz)  04/17/12 72.576 kg (160 lb)  04/17/12 72.576 kg (160 lb)    Physical Exam:  Head: Normocephalic.  Eyes:  Pupils round and reactive to light  Neck: Neck supple. No thyromegaly present.  Cardiovascular: Normal rate and regular rhythm.  Pulmonary/Chest: Breath sounds normal. No respiratory distress. She has no wheezes.  Abdominal: Bowel sounds are normal. She exhibits no distension. There is no tenderness.  Musculoskeletal:  Back appropriately tender. Good sitting posture Neurological: She is alert and oriented to person, place, and time. No cranial nerve deficit or sensory deficit.  Follows full commands. Moves all 4's with some pain inhibition weaknes proximally in the legs. dtr's are 1+  Skin:  Back incision dressing clean and dry with steristrips Psychiatric: She has a normal mood and affect. Her behavior is normal. Judgment and thought content normal.  Motor:5/5 in B Delt Bi Tri Grip, 4/5 in B HF,KN, Ankle DF/PF  Sensation intact in both LE and UEs  Tone is normal     Assessment/Plan: 1. Functional deficits secondary to lumbar stenosis with radiculopathy s/p decompression and fusion which require 3+ hours per day of interdisciplinary therapy in a comprehensive inpatient rehab setting. Physiatrist is providing close team supervision and 24 hour management  of active medical problems listed below. Physiatrist and rehab team continue to assess barriers to discharge/monitor patient progress toward functional and medical goals. FIM:       FIM - Toileting Toileting steps completed by patient: Adjust clothing prior to toileting;Performs perineal hygiene;Adjust clothing after toileting Toileting Assistive Devices: Grab bar or rail for support Toileting: 4: Steadying assist           Comprehension Comprehension Mode: Auditory Comprehension: 7-Follows complex conversation/direction: With no assist  Expression Expression Mode: Verbal Expression: 7-Expresses complex ideas: With no assist  Social Interaction Social Interaction: 7-Interacts appropriately with others - No medications needed.  Problem Solving Problem Solving: 7-Solves complex problems: Recognizes & self-corrects  Memory Memory: 7-Complete Independence: No helper  Medical Problem List and Plan:  1. Lumbar stenosis/spondylosis/radiculopathy. Status post decompressive lumbar laminectomy L3-4-5 with pedicle screw fixation 04/17/2012  2. DVT Prophylaxis/Anticoagulation: SCDs. Monitor for any signs of DVT  3. Pain Management: Oxycodone and Robaxin as needed. Mobic 15 mg daily. Monitor with increased mobility  4. Mood: Paxil 20 mg daily. Provide emotional support and positive reinforcement  5. Neuropsych: This patient is capable of making decisions on his/her own behalf.  6. Hypothyroid. Synthroid  7. Constipation. Adjust bowel program as needed  8. History of overactive bladder. Check PVRs x3  9. GERD. Protonix  LOS (Days) 1 A FACE TO FACE EVALUATION WAS PERFORMED  Shannon Gutierrez T 04/21/2012, 7:42 AM

## 2012-04-21 NOTE — Plan of Care (Signed)
Problem: RH BOWEL ELIMINATION Goal: RH STG MANAGE BOWEL WITH ASSISTANCE STG Manage Bowel with min Assistance.  Outcome: Not Progressing Last BM 04/16/2012. Laxative given. Awaiting for results. Goal: RH STG MANAGE BOWEL W/MEDICATION W/ASSISTANCE STG Manage Bowel with Medication with min assist and prn medications  Outcome: Not Progressing Last BM 04/16/2012. Laxative given. Awaiting for results.

## 2012-04-21 NOTE — Progress Notes (Signed)
Patient information reviewed and entered into eRehab system by Madelaine Whipple, RN, CRRN, PPS Coordinator.  Information including medical coding and functional independence measure will be reviewed and updated through discharge.    

## 2012-04-21 NOTE — Evaluation (Signed)
Physical Therapy Assessment and Plan/ Treatment note  Patient Details  Name: Shannon Gutierrez MRN: [id number] Date of Birth: October 28, 1945  PT Diagnosis: Abnormal posture, Abnormality of gait, Difficulty walking, Low back pain, Muscle spasms and Muscle weakness Rehab Potential: Excellent ELOS: 5-7 days   Today's Date: 04/21/2012 Time:Evaluation:  4098-1191 Session #2: 13:15-13:56 Time Calculation (min): Evaluation: 40 min, Session #2: 41 min  Problem List:  Patient Active Problem List  Diagnosis  . Radiculopathy of lumbar region    Past Medical History:  Past Medical History  Diagnosis Date  . Hypothyroidism   . Anxiety   . Overactive bladder   . Arthritis     osteoarthritis in knees and back  . Degeneration of lumbar intervertebral disc    Past Surgical History:  Past Surgical History  Procedure Date  . Carpal tunnel release     bilateral  . Tonsillectomy   . Abdominal hysterectomy   . Eye surgery   . Breast surgery 1980    augmentation  . Joint replacement   . Hand surgery     CMP joint  . Total thyroidectomy 1980    Assessment & Plan Clinical Impression: Patient is a 66 y.o. year old female with recent admission to the hospital on 04/17/12 with work related injury to her back and worsening pain radiating to the lower extremities after she fell off of a stool. X-rays and imaging revealed severe spinal stenosis with disc bulge as well as radiculopathy lumbar 3-4-5. No relief with conservative care. Underwent decompressive lumbar laminectomy lumbar 3-4-5 with pedicle screw fixation 04/17/2012 per Dr. Wynetta Emery. Fitted with a back corset applied in the sitting position.  Patient transferred to CIR on 04/20/2012 .   Patient currently requires supervision with mobility secondary to muscle weakness.  Prior to hospitalization, patient was independent with mobility and lived with Alone in a Other (Comment) (townhouse) home.  Home access is  Level entry.  Patient will benefit from  skilled PT intervention to maximize safe functional mobility and minimize fall risk for planned discharge home alone.  Anticipate patient will not need PT follow up at discharge.  PT - End of Session Activity Tolerance: Tolerates 30+ min activity without fatigue Endurance Deficit: No PT Assessment Rehab Potential: Excellent Barriers to Discharge: Decreased caregiver support PT Plan PT Frequency: 2-3 X/day, 60-90 minutes Estimated Length of Stay: 5-7 days PT Treatment/Interventions: Ambulation/gait training;Balance/vestibular training;Community reintegration;Discharge planning;DME/adaptive equipment instruction;Functional mobility training;Neuromuscular re-education;Pain management;Patient/family education;Stair training;Therapeutic Activities;Therapeutic Exercise;UE/LE Strength taining/ROM PT Recommendation Follow Up Recommendations: None Equipment Recommended: Rolling walker with 5" wheels;Other (comment) Equipment Details: possibly RW vs none, depends on progress  PT Evaluation Precautions/Restrictions Precautions Precautions: Back;Fall Precaution Comments: patient able to recall 3/3 back precautions Required Braces or Orthoses: Spinal Brace Spinal Brace: Lumbar corset;Applied in sitting position  Pain Pain Assessment Pain Assessment: 0-10 Pain Score:   4 Pain Type: Surgical pain Pain Location: Back Pain Intervention(s): Medication (See eMAR) Home Living/Prior Functioning Home Living Lives With: Alone Available Help at Discharge: Family (2 sons and daughter will be available at d/c) Type of Home: Other (Comment) (townhouse) Home Access: Level entry Home Layout: One level Bathroom Shower/Tub: Walk-in Contractor: Standard Bathroom Accessibility: Yes How Accessible: Accessible via walker Home Adaptive Equipment: Sock aid;Reacher;Built-in shower seat;Long-handled sponge;Long-handled shoehorn;Hand-held shower hose Prior Function Level of Independence:  Independent with homemaking with ambulation;Independent with homemaking with wheelchair;Independent with gait;Independent with transfers;Independent with basic ADLs Able to Take Stairs?: No Driving: Yes 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   Leisure: Hobbies-yes (Comment) Comments: likes to hang out with grandkids and greatgrandkids.  Likes going out to eat and spending time with family.      Cognition Orientation Level: Oriented X4 Sensation Sensation Light Touch: Appears Intact Motor     Mobility Bed Mobility Bed Mobility: Rolling Right;Rolling Left;Right Sidelying to Sit;Sitting - Scoot to Edge of Bed;Sit to Sidelying Right Rolling Right: 6: Modified independent (Device/Increase time);With rail Rolling Left: 6: Modified independent (Device/Increase time);With rail Right Sidelying to Sit: 6: Modified independent (Device/Increase time);HOB flat Sitting - Scoot to Edge of Bed: 6: Modified independent (Device/Increase time) Sit to Sidelying Right: 6: Modified independent (Device/Increase time);HOB flat Transfers Sit to Stand: 5: Supervision;From bed;From chair/3-in-1;With upper extremity assist;With armrests Sit to Stand Details: Verbal cues for precautions/safety Stand to Sit: 5: Supervision;To bed;To chair/3-in-1;With armrests;With upper extremity assist Stand to Sit Details (indicate cue type and reason): Verbal cues for precautions/safety Locomotion  Ambulation Ambulation: Yes Ambulation/Gait Assistance: 4: Min assist;5: Supervision (supervision with RW, min assist with no device) Ambulation Distance (Feet): 500 Feet Assistive device: Rolling walker;1 person hand held assist Ambulation/Gait Assistance Details: Verbal cues for safe use of DME/AE;Other (comment) (verbal cues for upright posture) Ambulation/Gait Assistance Details: with RW  supervision, without RW min hand held assist.   Stairs / Additional Locomotion Stairs: Yes Stairs Assistance: 5: Supervision Stairs Assistance Details: Verbal cues for sequencing;Verbal cues for technique Stair Management Technique: Two rails;Step to pattern;Forwards Number of Stairs: 4  Height of Stairs: 6     Extremity Assessment      RLE Assessment RLE Assessment: Exceptions to [facility] RLE Strength RLE Overall Strength Comments: grossly 4/5, pt reports this leg is her "bad" leg.  She was suppose to have a TKA, but this was delayed due to back issued.   LLE Assessment LLE Assessment: Exceptions to [facility] LLE Strength LLE Overall Strength Comments: grossly 4/5  See FIM for current functional status Refer to Care Plan for Long Term Goals  Recommendations for other services: None  Discharge Criteria: Patient will be discharged from PT if patient refuses treatment 3 consecutive times without medical reason, if treatment goals not met, if there is a change in medical status, if patient makes no progress towards goals or if patient is discharged from hospital.  The above assessment, treatment plan, treatment alternatives and goals were discussed and mutually agreed upon: by patient  Skilled interventions: Evaluation: Verbally reviewed back precautions, correct brace positioning as well as when and where to put it on (sitting EOB, anytime she is up).  She was able to report 3/3 precautions independently.  We reviewed bed mobility including log roll technique, practiced the stairs step to with Bil rails, walked indoor level surfaces both with and without her RW, and transferred into and out of the car with education on how to make this easier and how to keep back precautions while doing this transfer.  See details above.    Session #2: Educated pt on functional activities and maintaining her back precautions.  Handout given and reviewed.  Practiced bed mobility rolling bil with log roll technique  and getting to sitting maintaining back precautions.  Practiced getting into/out of high bed (pt reports hers is high at home).  Gait with and without RW with RW supervision, without min hand held assit. Both 150'.  Stairs reciprocally with bil hand rails supervision.  There-ex: seated heel raises  and toe raises, LAQs with 5 second holds, hip adduction against pillow 5 second holds all active all 12-20 reps (until fatigue).    Rollene Rotunda Danijela Vessey, PT, DPT [phone]   04/21/2012, 12:33 PM

## 2012-04-21 NOTE — Progress Notes (Signed)
Social Work Assessment and Plan Social Work Assessment and Plan  Patient Details  Name: Shannon Gutierrez MRN: [id number] Date of Birth: April 15, 1946  Today's Date: 04/21/2012  Problem List:  Patient Active Problem List  Diagnosis  . Radiculopathy of lumbar region   Past Medical History:  Past Medical History  Diagnosis Date  . Hypothyroidism   . Anxiety   . Overactive bladder   . Arthritis     osteoarthritis in knees and back  . Degeneration of lumbar intervertebral disc    Past Surgical History:  Past Surgical History  Procedure Date  . Carpal tunnel release     bilateral  . Tonsillectomy   . Abdominal hysterectomy   . Eye surgery   . Breast surgery 1980    augmentation  . Joint replacement   . Hand surgery     CMP joint  . Total thyroidectomy 1980   Social History:  reports that she has quit smoking. Her smoking use included Cigarettes. She has a 10 pack-year smoking history. She has never used smokeless tobacco. She reports that she does not drink alcohol or use illicit drugs.  Family / Support Systems Marital Status: Divorced Patient Roles: Parent;Other (Comment) (Employee) Children: Odie-son [phone]   Print production planner Other Supports: Naylin Burkle  [phone]  [phone] Anticipated Caregiver: Pateint with children checking in and out on Ability/Limitations of Caregiver: Children all work but can be in and out Caregiver Availability: Intermittent Family Dynamics: Close knit family, all three children are local and involved with pt.  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  She is glad to have the surgery over and now feels she is on the path to recovery. Recent Psychosocial Issues: Other medical issues Pyschiatric History: History of anxiety at times needs to take something but feels ok now.  Deferred depression screen due to felt not necessary at this time.  Pt feels she is doing well with all of this and has a sense of relief surgery over. Substance Abuse History: No issues  Patient / Family Perceptions, Expectations & Goals Pt/Family understanding of illness & functional limitations: Pt has a good understanding of her surgery and back precautions.  She feels she is doing fairly well and will be going home soon. 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  Community Resources [employer]: Other (Comment) (Workers Comp involved) Premorbid Home Care/DME Agencies: None Transportation available at discharge: Family  Discharge Planning Living Arrangements: Alone Support Systems: Children;Friends/neighbors;Church/faith community Type of Residence: Private residence Insurance Resources: [employer] (specify);Insurance Case Manager (specify name) Orson Slick) Financial Resources: Employment Financial Screen Referred: No Living Expenses: Own Money Management: Patient Do you have any problems  obtaining your medications?: No Home  Management: Pateint-daughter until pt is able to reume Patient/Family Preliminary Plans: Return home with children assisting with transportation, checking in on and home management Social Work Anticipated Follow Up Needs: HH/OP DC Planning Additional Notes/Comments: Pt is at a high level will be here a short time  Clinical Impression Motivated high functioning female who is doing well.  Aware she will not be here long, will assist with DME and follow up.    Lucy Chris 04/21/2012, 10:30 AM

## 2012-04-22 ENCOUNTER — Inpatient Hospital Stay (HOSPITAL_COMMUNITY): Payer: Worker's Compensation | Admitting: Physical Therapy

## 2012-04-22 ENCOUNTER — Inpatient Hospital Stay (HOSPITAL_COMMUNITY): Payer: Self-pay | Admitting: Occupational Therapy

## 2012-04-22 ENCOUNTER — Inpatient Hospital Stay (HOSPITAL_COMMUNITY): Payer: Worker's Compensation | Admitting: Occupational Therapy

## 2012-04-22 NOTE — Progress Notes (Signed)
Physical Therapy Session Note  Patient Details  Name: Shannon Gutierrez MRN: [id number] Date of Birth: 27-Oct-1945  Today's Date: 04/22/2012 Time: 1030-1114 Time Calculation (min): 44 min  Short Term Goals: Week 1:  PT Short Term Goal 1 (Week 1): STGs = LTGs  Skilled Therapeutic Interventions/Progress Updates:   Community ambulation with RW, supervision over uneven brick, incline and decline surfaces x >300'. Ambulation x 200' with RW through gift shop working on tight space negotiation, side stepping, and backwards walking performed with supervision. Discussed safety and management in home environment such as use of cart for transport of food while using RW. Ambulation x >500' throughout hospital, standing during elevator ride all with supervision approaching modified independent.   Therapy Documentation Precautions:  Precautions Precautions: Back;Fall Precaution Comments: patient able to recall 3/3 back precautions Required Braces or Orthoses: Spinal Brace Spinal Brace: Lumbar corset;Applied in sitting position Restrictions Weight Bearing Restrictions: No Pain: Pain Assessment Pain Assessment: No/denies pain   See FIM for current functional status  Therapy/Group: Individual Therapy  Wilhemina Bonito 04/22/2012, 12:38 PM

## 2012-04-22 NOTE — Progress Notes (Signed)
Occupational Therapy Session Note  Patient Details  Name: Shannon Gutierrez MRN: [id number] Date of Birth: 1946/05/14  Today's Date: 04/22/2012 Time: 1400-1445 Time Calculation (min): 45 min  Short Term Goals: Week 1:  OT Short Term Goal 1 (Week 1): Short Term Goals = Long Term Goals secondary to ELOS  Skilled Therapeutic Interventions/Progress Updates: Patient seen this pm for 1:1 OT session to address functional living skills.  Walked to ADL apartment with rolling walker and distant supervision.  Practiced simple meal prep with verbal cueing initially, frozen dinner to microwave.  Reviewed safe use of walker and maintaining back precautions when accessing cabinets, drawers, range, opening and closing doors, etc.  Patient with excellent questions regarding routines to establish with ADL/IADL post discharge.  She plans for intermittent supervision / assist from daughter as needed.      Therapy Documentation Pain: Pain Assessment Pain Assessment: No/denies pain  See FIM for current functional status  Therapy/Group: Individual Therapy  Collier Salina 04/22/2012, 2:54 PM

## 2012-04-22 NOTE — Progress Notes (Signed)
Occupational Therapy Session Note  Patient Details  Name: SHASTINA MESICH MRN: [id number] Date of Birth: 06/07/1946  Today's Date: 04/22/2012 Time: 0900-1000 Time Calculation (min): 60 min  Short Term Goals: Week 1:  OT Short Term Goal 1 (Week 1): Short Term Goals = Long Term Goals secondary to ELOS  Skilled Therapeutic Interventions/Progress Updates:  Patient resting in bed upon arrival. Self care retraining to include toileting, shower, dress, and groom.  Focus session on adhereing to back precautions and walker safety during all above BADL tasks to include gather supplies before shower and clean up after shower.  Patient able to perform LB bath and dress without use of AE except did use the reacher a few times.  Patient required 6-8 cues to adhere to back precautions.  Therapy Documentation Precautions:  Precautions Precautions: Back;Fall Precaution Comments: patient able to recall 3/3 back precautions Required Braces or Orthoses: Spinal Brace Spinal Brace: Lumbar corset;Applied in sitting position Restrictions Weight Bearing Restrictions: No Pain: Denies pain  See FIM for current functional status  Therapy/Group: Individual Therapy  Danne Vasek 04/22/2012, 10:10 AM

## 2012-04-22 NOTE — Progress Notes (Signed)
Physical Therapy Note  Patient Details  Name: Shannon Gutierrez MRN: [id number] Date of Birth: [birth year] Today's Date: 04/22/2012  1330-1400 (30 minutes) individual Pain: no complaint of pain Focus of treatment: Therapeutic exercises focused on bilateral LE strengthening/standing balance; gait training/endurance with RW on level/steps/ up-down curb Treatment: Gait to/from room to gym with RW SBA (150+ feet); up/down curb with RW SBA; up/down 2 steps with one rail + folded AD SBA; up /down 2 steps with no rails min assist for safety; marching in place X 20 without UE support SBA; up/down 6 inch step with RW support for quad strengthening.   Rahmah Mccamy,JIM 04/22/2012, 8:19 AM

## 2012-04-22 NOTE — Progress Notes (Signed)
Patient ID: Shannon Gutierrez, female   DOB: [birth year], 66 y.o.   MRN: [id number] Subjective/Complaints: 10/19. Had a good night. Slept well. Pain under control and much improved.  Ambulatory with walker HEENT- neg; Chest- clear; CV- regular; Abd- benign; Extr- neg; lumbar brace present  BP Readings from Last 3 Encounters:  04/22/12 131/55  04/20/12 117/45  04/20/12 117/45   Objective: Vital Signs: Blood pressure 131/55, pulse 84, temperature 98.3 F (36.8 C), temperature source Oral, resp. rate 16, height 5\' 3"  (1.6 m), weight 72 kg (158 lb 11.7 oz), SpO2 100.00%. No results found.  Basename 04/21/12 0645  WBC 11.2*  HGB 10.6*  HCT 32.2*  PLT 257    Basename 04/21/12 0645  NA 142  K 4.4  CL 107  CO2 27  GLUCOSE 109*  BUN 12  CREATININE 0.78  CALCIUM 8.5   CBG (last 3)  No results found for this basename: GLUCAP:3 in the last 72 hours  Wt Readings from Last 3 Encounters:  04/20/12 72 kg (158 lb 11.7 oz)  04/17/12 72.576 kg (160 lb)  04/17/12 72.576 kg (160 lb)    Physical Exam:  Head: Normocephalic.  Eyes:  Pupils round and reactive to light  Neck: Neck supple. No thyromegaly present.  Cardiovascular: Normal rate and regular rhythm.  Pulmonary/Chest: Breath sounds normal. No respiratory distress. She has no wheezes.  Abdominal: Bowel sounds are normal. She exhibits no distension. There is no tenderness.  Musculoskeletal:  Back appropriately tender. Good sitting posture Neurological: She is alert and oriented to person, place, and time. No cranial nerve deficit or sensory deficit.  Follows full commands. Moves all 4's with some pain inhibition weaknes proximally in the legs. dtr's are 1+  Skin:  Back incision dressing clean and dry with steristrips Psychiatric: She has a normal mood and affect. Her behavior is normal. Judgment and thought content normal.  Motor:5/5 in B Delt Bi Tri Grip, 4/5 in B HF,KN, Ankle DF/PF  Sensation intact in both LE and UEs  Tone is  normal     Assessment/Plan: 1. Functional deficits secondary to lumbar stenosis with radiculopathy s/p decompression and fusion which require 3+ hours per day of interdisciplinary therapy in a comprehensive inpatient rehab setting. Physiatrist is providing close team supervision and 24 hour management of active medical problems listed below. Physiatrist and rehab team continue to assess barriers to discharge/monitor patient progress toward functional and medical goals. FIM: FIM - Bathing Bathing Steps Patient Completed: Chest;Right Arm;Front perineal area;Buttocks;Right upper leg;Left upper leg;Abdomen;Left Arm;Right lower leg (including foot);Left lower leg (including foot) Bathing: 5: Supervision: Safety issues/verbal cues  FIM - Upper Body Dressing/Undressing Upper body dressing/undressing steps patient completed: Thread/unthread right bra strap;Thread/unthread left bra strap;Hook/unhook bra;Thread/unthread right sleeve of pullover shirt/dresss;Thread/unthread left sleeve of pullover shirt/dress;Put head through opening of pull over shirt/dress;Pull shirt over trunk Upper body dressing/undressing: 5: Set-up assist to: Obtain clothing/put away FIM - Lower Body Dressing/Undressing Lower body dressing/undressing steps patient completed: Thread/unthread right underwear leg;Thread/unthread left underwear leg;Pull underwear up/down;Thread/unthread right pants leg;Thread/unthread left pants leg;Pull pants up/down;Don/Doff right sock;Don/Doff left sock;Don/Doff right shoe;Don/Doff left shoe;Fasten/unfasten right shoe;Fasten/unfasten left shoe Lower body dressing/undressing: 5: Supervision: Safety issues/verbal cues  FIM - Toileting Toileting steps completed by patient: Adjust clothing prior to toileting;Performs perineal hygiene;Adjust clothing after toileting Toileting Assistive Devices: Grab bar or rail for support Toileting: 5: Supervision: Safety issues/verbal cues  FIM - Ambulance person Devices: Grab bars;Walker;Elevated toilet seat Toilet Transfers: 5-To toilet/BSC: Supervision (verbal cues/safety  issues);5-From toilet/BSC: Supervision (verbal cues/safety issues)  FIM - Banker Devices: Walker;Bed rails Bed/Chair Transfer: 6: Supine > Sit: No assist;6: Assistive device: no helper;6: More than reasonable amt of time;6: Sit > Supine: No assist;5: Bed > Chair or W/C: Supervision (verbal cues/safety issues);5: Chair or W/C > Bed: Supervision (verbal cues/safety issues)  FIM - Locomotion: Wheelchair Locomotion: Wheelchair: 0: Activity did not occur FIM - Locomotion: Ambulation Locomotion: Ambulation Assistive Devices: Walker - Rolling;Other (comment) (hand held assist) Ambulation/Gait Assistance: 4: Min assist;5: Supervision (supervision with RW, min assist with no device) Locomotion: Ambulation: 5: Travels 150 ft or more with supervision/safety issues  Comprehension Comprehension Mode: Auditory Comprehension: 7-Follows complex conversation/direction: With no assist  Expression Expression Mode: Verbal Expression: 7-Expresses complex ideas: With no assist  Social Interaction Social Interaction: 7-Interacts appropriately with others - No medications needed.  Problem Solving Problem Solving: 7-Solves complex problems: Recognizes & self-corrects  Memory Memory: 7-Complete Independence: No helper  Medical Problem List and Plan:  1. Lumbar stenosis/spondylosis/radiculopathy. Status post decompressive lumbar laminectomy L3-4-5 with pedicle screw fixation 04/17/2012  2. DVT Prophylaxis/Anticoagulation: SCDs. Monitor for any signs of DVT  3. Pain Management: Oxycodone and Robaxin as needed. Mobic 15 mg daily. Monitor with increased mobility  4. Mood: Paxil 20 mg daily. Provide emotional support and positive reinforcement  5. Neuropsych: This patient is capable of making decisions on his/her own  behalf.  6. Hypothyroid. Synthroid  7. Constipation. Adjust bowel program as needed  8. History of overactive bladder. Check PVRs x3  9. GERD. Protonix  LOS (Days) 2 A FACE TO FACE EVALUATION WAS PERFORMED  Rogelia Boga 04/22/2012, 9:31 AM

## 2012-04-23 ENCOUNTER — Inpatient Hospital Stay (HOSPITAL_COMMUNITY): Payer: Worker's Compensation | Admitting: Physical Therapy

## 2012-04-23 NOTE — Progress Notes (Signed)
Patient ID: JANICE BODINE, female   DOB: May 24, 1946, 66 y.o.   MRN: [id number] Patient ID: LIZBETH FEIJOO, female   DOB: [birth year], 66 y.o.   MRN: [id number] Subjective/Complaints:  10/20.   Remains stableHad a good night. Slept well. Pain under control and much improved.  Ambulatory with walker HEENT- neg; Chest- clear; CV- regular; Abd- benign; Extr- neg; lumbar brace present  BP Readings from Last 3 Encounters:  04/23/12 113/70  04/20/12 117/45  04/20/12 117/45   Objective: Vital Signs: Blood pressure 113/70, pulse 76, temperature 98.5 F (36.9 C), temperature source Oral, resp. rate 18, height 5\' 3"  (1.6 m), weight 72 kg (158 lb 11.7 oz), SpO2 97.00%. No results found.  Basename 04/21/12 0645  WBC 11.2*  HGB 10.6*  HCT 32.2*  PLT 257    Basename 04/21/12 0645  NA 142  K 4.4  CL 107  CO2 27  GLUCOSE 109*  BUN 12  CREATININE 0.78  CALCIUM 8.5   CBG (last 3)  No results found for this basename: GLUCAP:3 in the last 72 hours  Wt Readings from Last 3 Encounters:  04/20/12 72 kg (158 lb 11.7 oz)  04/17/12 72.576 kg (160 lb)  04/17/12 72.576 kg (160 lb)    Physical Exam:  Head: Normocephalic.  Eyes:  Pupils round and reactive to light  Neck: Neck supple. No thyromegaly present.  Cardiovascular: Normal rate and regular rhythm.  Pulmonary/Chest: Breath sounds normal. No respiratory distress. She has no wheezes.  Abdominal: Bowel sounds are normal. She exhibits no distension. There is no tenderness.  Musculoskeletal:  Back appropriately tender. Good sitting posture Neurological: She is alert and oriented to person, place, and time. No cranial nerve deficit or sensory deficit.  Follows full commands. Moves all 4's with some pain inhibition weaknes proximally in the legs. dtr's are 1+  Skin:  Back incision dressing clean and dry with steristrips Psychiatric: She has a normal mood and affect. Her behavior is normal. Judgment and thought content normal.  Motor:5/5  in B Delt Bi Tri Grip, 4/5 in B HF,KN, Ankle DF/PF  Sensation intact in both LE and UEs  Tone is normal     Assessment/Plan: 1. Functional deficits secondary to lumbar stenosis with radiculopathy s/p decompression and fusion which require 3+ hours per day of interdisciplinary therapy in a comprehensive inpatient rehab setting. Physiatrist is providing close team supervision and 24 hour management of active medical problems listed below. Physiatrist and rehab team continue to assess barriers to discharge/monitor patient progress toward functional and medical goals. FIM: FIM - Bathing Bathing Steps Patient Completed: Chest;Right Arm;Front perineal area;Buttocks;Right upper leg;Left upper leg;Abdomen;Left Arm;Right lower leg (including foot);Left lower leg (including foot) Bathing: 5: Supervision: Safety issues/verbal cues  FIM - Upper Body Dressing/Undressing Upper body dressing/undressing steps patient completed: Thread/unthread right bra strap;Thread/unthread left bra strap;Hook/unhook bra;Thread/unthread right sleeve of pullover shirt/dresss;Thread/unthread left sleeve of pullover shirt/dress;Put head through opening of pull over shirt/dress;Pull shirt over trunk Upper body dressing/undressing: 5: Set-up assist to: Obtain clothing/put away FIM - Lower Body Dressing/Undressing Lower body dressing/undressing steps patient completed: Thread/unthread right underwear leg;Thread/unthread left underwear leg;Pull underwear up/down;Thread/unthread right pants leg;Thread/unthread left pants leg;Pull pants up/down;Don/Doff right sock;Don/Doff left sock;Don/Doff right shoe;Don/Doff left shoe;Fasten/unfasten right shoe;Fasten/unfasten left shoe Lower body dressing/undressing: 5: Supervision: Safety issues/verbal cues (only needed to use reacher)  FIM - Toileting Toileting steps completed by patient: Adjust clothing prior to toileting;Performs perineal hygiene;Adjust clothing after toileting Toileting  Assistive Devices: Grab bar or rail for support Toileting:  5: Supervision: Safety issues/verbal cues  FIM - Diplomatic Services operational officer Devices: Grab bars;Walker;Elevated toilet seat Toilet Transfers: 5-To toilet/BSC: Supervision (verbal cues/safety issues);5-From toilet/BSC: Supervision (verbal cues/safety issues)  FIM - Banker Devices: Walker;Bed rails Bed/Chair Transfer: 6: Supine > Sit: No assist;5: Bed > Chair or W/C: Supervision (verbal cues/safety issues);5: Chair or W/C > Bed: Supervision (verbal cues/safety issues)  FIM - Locomotion: Wheelchair Locomotion: Wheelchair: 1: Total Assistance/staff pushes wheelchair (Pt<25%) FIM - Locomotion: Ambulation Locomotion: Ambulation Assistive Devices: Designer, industrial/product Ambulation/Gait Assistance: 5: Supervision Locomotion: Ambulation: 5: Travels 150 ft or more with supervision/safety issues  Comprehension Comprehension Mode: Auditory Comprehension: 7-Follows complex conversation/direction: With no assist  Expression Expression Mode: Verbal Expression: 7-Expresses complex ideas: With no assist  Social Interaction Social Interaction: 7-Interacts appropriately with others - No medications needed.  Problem Solving Problem Solving: 7-Solves complex problems: Recognizes & self-corrects  Memory Memory: 7-Complete Independence: No helper  Medical Problem List and Plan:  1. Lumbar stenosis/spondylosis/radiculopathy. Status post decompressive lumbar laminectomy L3-4-5 with pedicle screw fixation 04/17/2012  2. DVT Prophylaxis/Anticoagulation: SCDs. Monitor for any signs of DVT  3. Pain Management: Oxycodone and Robaxin as needed. Mobic 15 mg daily. Monitor with increased mobility  4. Mood: Paxil 20 mg daily. Provide emotional support and positive reinforcement  5. Neuropsych: This patient is capable of making decisions on his/her own behalf.  6. Hypothyroid. Synthroid  7.  Constipation. Adjust bowel program as needed  8. History of overactive bladder. Check PVRs x3  9. GERD. Protonix  LOS (Days) 3 A FACE TO FACE EVALUATION WAS PERFORMED  Rogelia Boga 04/23/2012, 8:45 AM

## 2012-04-23 NOTE — Progress Notes (Signed)
Physical Therapy Note  Patient Details  Name: DEAUNA YAW MRN: [id number] Date of Birth: [birth year] Today's Date: 04/23/2012  1030-1110 (40 minutes) individual Pain: no complaint of pain Focus of treatment: gait training/endurance; therapeutic activities focused on standing balance Treatment: pt ambulates on unit using RW SBA for safety only 150 + feet, level; [employer] - Limits of stability, weight shifts in all directions without UE support SBA for safety  only (firm surface); pt instructed and redemonstrated use of reacher using knees vs bending .    Temia Debroux,JIM 04/23/2012, 7:58 AM

## 2012-04-24 ENCOUNTER — Inpatient Hospital Stay (HOSPITAL_COMMUNITY): Payer: Worker's Compensation | Admitting: Occupational Therapy

## 2012-04-24 ENCOUNTER — Inpatient Hospital Stay (HOSPITAL_COMMUNITY): Payer: Worker's Compensation | Admitting: *Deleted

## 2012-04-24 ENCOUNTER — Inpatient Hospital Stay (HOSPITAL_COMMUNITY): Payer: Worker's Compensation

## 2012-04-24 NOTE — Progress Notes (Signed)
Physical Therapy Session Note  Patient Details  Name: Shannon Gutierrez MRN: [id number] Date of Birth: 10-15-1945  Today's Date: 04/24/2012 Time: 1030-1230 Time Calculation (min): 120 min  Pt participated in community lunch outing to [employer] with emphasis on community mobility with RW, transfers to various surfaces, energy conservation techniques, functional mobility while maintaining back precautions, stair/curb and ramp negotiation and problem solving through various situations. Pt completed outing at overall modified independent to S level using RW.   Therapy Documentation Precautions:  Precautions Precautions: Back;Fall Precaution Comments: patient able to recall 3/3 back precautions Required Braces or Orthoses: Spinal Brace Spinal Brace: Lumbar corset;Applied in sitting position Restrictions Weight Bearing Restrictions: No Locomotion : Ambulation Ambulation/Gait Assistance: 6: Modified independent (Device/Increase time)   See FIM for current functional status  Therapy/Group: Community Re-entry  Tedd Sias 04/24/2012, 1:28 PM

## 2012-04-24 NOTE — Progress Notes (Signed)
Social Work Patient ID: Shannon Gutierrez, female   DOB: [birth year], 66 y.o.   MRN: [id number] Met with team and spoke with MD all feel pt will be ready for discharge tomorrow.  Pt agreeable and looking forward to it. Discussed follow up PT recommends OPPT no OT recommended.  DME to be delivered tomorrow.

## 2012-04-24 NOTE — Progress Notes (Signed)
Occupational Therapy Discharge Summary & Session Note  Patient Details  Name: Shannon Gutierrez MRN: [id number] Date of Birth: June 11, 1946  Today's Date: 04/24/2012 Time: 0830-0925 Time Calculation (min): 55 min Individual Therapy No complaints of pain Patient found supine in bed with lumbar corset donned. Engaged in bed mobility for patient to ambulate throughout room with use of rolling walker to gather all neccessary items for ADL. Patient then engaged in toilet transfer, toileting, and shower stall transfer onto tub transfer bench for ADL retraining at shower level. Focused skilled intervention on UB/LB bathing & dressing, overall activity tolerance/endurance, sit/stands, dynamic standing balance/tolerance/endurance, donning & doffing of lumbar corset, and ADL transfers. Patient performing at an overall modified independent level for BADLs with use of AE prn (reacher & rolling walker). At end of session left patient seated in recliner with call bell & phone within reach. Patient made modified independent in room.   ----------------------------------------------------------------------------------------------------------  DISCHARGE SUMMARY Patient has met 12 of 12 long term goals due to improved activity tolerance, improved balance, postural control, ability to compensate for deficits, improved attention, improved awareness and improved coordination.  Patient to discharge at overall Modified Independent level.  Patient's care partner is independent to provide the necessary supervision prn assistance at discharge.    Reasons goals not met: n/a; all goals met at this time  Recommendation: No additional occupational therapy recommended at this time.  Equipment: shower seat and BSC  Reasons for discharge: treatment goals met and discharge from hospital  Patient/family agrees with progress made and goals achieved: Yes  Precautions/Restrictions  Precautions Precautions: Back;Fall Precaution  Comments: patient able to recall and adhere to 3/3 back precautions during BADLs Required Braces or Orthoses: Spinal Brace Spinal Brace: Lumbar corset;Applied in sitting position Restrictions Weight Bearing Restrictions: No  ADL - See FIM  Vision/Perception  Vision - History Baseline Vision: No visual deficits Patient Visual Report: No change from baseline Vision - Assessment Eye Alignment: Within Functional Limits Perception Perception: Within Functional Limits Praxis Praxis: Intact   Cognition Overall Cognitive Status: Appears within functional limits for tasks assessed Arousal/Alertness: Awake/alert Orientation Level: Oriented X4 Memory: Appears intact Awareness: Appears intact Problem Solving: Appears intact Safety/Judgment: Appears intact  Sensation Sensation Additional Comments: appears intact for bilateral UEs Coordination Gross Motor Movements are Fluid and Coordinated: Yes Fine Motor Movements are Fluid and Coordinated: Yes  Motor - See Discharge Navigator  Mobility - See Discharge Navigato  Trunk/Postural Assessment - See Discharge Navigato  Balance- See Discharge Navigato  Extremity/Trunk Assessment RUE Assessment RUE Assessment: Within Functional Limits LUE Assessment LUE Assessment: Within Functional Limits  See FIM for current functional status  Eleanna Theilen 04/24/2012, 3:14 PM

## 2012-04-24 NOTE — Discharge Summary (Signed)
NAMEDHWANI, VENKATESH NO.:  [id number]  MEDICAL RECORD NO.:  [id number]  LOCATION:  4009                         FACILITY:  [facility]  PHYSICIAN:  Ranelle Oyster, M.D.DATE OF BIRTH:  18-Mar-1946  DATE OF ADMISSION:  04/20/2012 DATE OF DISCHARGE:  04/25/2012                              DISCHARGE SUMMARY   DISCHARGE DIAGNOSES:  Lumbar stenosis with radiculopathy.  Sequential compression devices for deep vein thrombosis prophylaxis, pain management, depression, hypothyroidism, constipation, history of overactive bladder and gastroesophageal reflux disease.  This is a 66 year old right-handed female admitted April 17, 2012, with work-related injury to her back and worsening pain radiating to lower extremities after she fell off of a stool.  X-rays and imaging revealed severe spinal stenosis with disk bulge as well as radiculopathy lumbar 3, 4, 5.  No relief with conservative care.  Underwent decompressive lumbar laminectomy, lumbar 3, 4, 5, with pedicle screw fixation, April 17, 2012, per Dr. Wynetta Emery.  Fitted with a back corset to be applied in sitting position.  Postoperative pain management.  Bouts of constipation with bowel program regulated.  The patient was admitted for comprehensive rehab program.  PAST MEDICAL HISTORY:  See discharge diagnoses.  SOCIAL HISTORY:  Lives alone, independent prior to admission.  FUNCTIONAL HISTORY:  Prior to admission was min to mod assist for overall functional mobility.  PHYSICAL EXAMINATION:  VITAL SIGNS:  Blood pressure 113/70, pulse 80, respirations 18, temperature 97.9. GENERAL:  This is an alert female, in no acute distress, oriented x3. LUNGS:  Clear to auscultation. CARDIAC:  Regular rate and rhythm. ABDOMEN:  Soft, nontender.  Good bowel sounds.  SKIN:  Back incision dressing was clean and dry.  REHABILITATION HOSPITAL COURSE:  The patient was admitted to inpatient rehab services with therapies initiated on a  3-hour daily basis consisting of physical therapy, occupational therapy, and rehabilitation nursing.  The following issues were addressed during the patient's rehabilitation stay.  Pertaining to Ms. Garrow' lumbar stenosis radiculopathy, she had undergone decompressive lumbar laminectomy 3, 4, 5 on April 17, 2012, per Dr. Wynetta Emery.  Surgical site healing nicely. Neurovascular sensation intact.  She was wearing a back corset when out of bed.  She continued with sequential compression devices for DVT prophylaxis and no signs of DVT.  Pain management ongoing with the use of oxycodone as well as Robaxin with good results.  She remained on Paxil for history of depression with emotional support provided.  Her mood and spirits remained upbeat.  She continued on hormone supplement for hypothyroidism.  Bouts of constipation with stool softener as advised.  No nausea, vomiting.  She did have a history of overactive bladder.  She was voiding without difficulty.  She continued on Protonix for history of gastroesophageal reflux disease.  REHABILITATION HOSPITAL COURSE:  The patient was admitted to inpatient rehab services.  She continued to receive weekly interdisciplinary team conferences.  She was modified independent in her room with back corset. She was advised no driving.  She was following her routine back precautions, needing some minimal assistance for lower body activities of daily living due to back precautions.  Plan is to be discharged to home after family teaching completed on April 25, 2012.  DISCHARGE MEDICATIONS:  At the time of dictation included Zovirax 400 mg twice daily as needed, Estrace 0.5 mg daily, Synthroid 88 mcg daily, Mobic 15 mg daily, Robaxin 500 mg every 6 hours as needed for spasms, multivitamin daily, oxycodone immediate release 5-10 mg every 4 hours as needed for pain dispense of 90 tablets, Protonix 40 mg daily, Paxil 20 mg p.o. daily, Senokot tablets 1 p.o.  b.i.d.  DIET:  Regular.  SPECIAL INSTRUCTIONS:  Continue back corset when out of bed.  No driving.  The patient should follow up Dr. Wynetta Emery, neurosurgery, call for appointment.  Follow up Dr. Faith Rogue at the outpatient rehab service office as needed.  Ongoing outpatient therapies have been arranged as per [employer].     Shannon Gutierrez, P.A.   ______________________________ Ranelle Oyster, M.D.    DA/MEDQ  D:  04/24/2012  T:  04/24/2012  Job:  [id number]  cc:   Donalee Citrin, M.D. Doreatha Martin, M.D.

## 2012-04-24 NOTE — Progress Notes (Signed)
Social Work Patient ID: Shannon Gutierrez, female   DOB: [birth year], 66 y.o.   MRN: [id number] Met with pt to discuss DME agreeable to rolling walker, bsc, and tub seat.  Have contacted Workers Comp and faxed in script. Continue to work on discharge plans

## 2012-04-24 NOTE — Progress Notes (Signed)
Physical Therapy Discharge Summary  Patient Details  Name: Shannon Gutierrez MRN: [id number] Date of Birth: 03-07-1946  Today's Date: 04/24/2012 Time: 1345-1445 Time Calculation (min): 60 min Individual therapy; Denies pain. Focus on gait and household mobility in ADL apartment and problem solving her kitchen set up in order to comply with back precautions and maintain independence as much as possible. Bed mobility in ADL apartment bed mod I and discussed use of stool if her bed was too high. Stair negotiation modified independent using rails. Recommended ascending with LLE and descending with RLE as much as possible due to premorbid knee issues with R knee (supposed to have TKA). Gait training without AD with S; one LOB noted but able to self correct. Pt with decreased confidence and shorter step length without device. Encouraged pt that she will continue to work on higher level balance and gait without AD in outpatient therapy and probably would not have to use RW/cane for very long. Issue [place] [facility] for increased strengthening and balance and handout given.  Pt feels ready for d/c and all questioned answered at this time.    Patient has met 11 of 11 long term goals due to improved activity tolerance and improved balance.  Patient to discharge at an ambulatory level Modified Independent using RW at this time.   Reasons goals not met: All goals met.  Recommendation:  Patient will benefit from ongoing skilled PT services in outpatient setting to continue to advance safe functional mobility, address ongoing impairments in gait, high level balance, muscular endurance, and minimize fall risk.  Equipment: RW  Reasons for discharge: treatment goals met and discharge from hospital  Patient/family agrees with progress made and goals achieved: Yes  PT Discharge Precautions/Restrictions Precautions Precautions: Back;Fall Precaution Comments: recalls and adheres to 3/3 back precautions Required  Braces or Orthoses: Spinal Brace Spinal Brace: Lumbar corset;Applied in sitting position Restrictions Weight Bearing Restrictions: No Vision/Perception  Vision - History Baseline Vision: No visual deficits Patient Visual Report: No change from baseline Vision - Assessment Eye Alignment: Within Functional Limits Perception Perception: Within Functional Limits Praxis Praxis: Intact  Cognition Overall Cognitive Status: Appears within functional limits for tasks assessed Arousal/Alertness: Awake/alert Orientation Level: Oriented X4 Memory: Appears intact Awareness: Appears intact Problem Solving: Appears intact Safety/Judgment: Appears intact Sensation Sensation Light Touch: Appears Intact Additional Comments: appears intact for bilateral UEs Coordination Gross Motor Movements are Fluid and Coordinated: Yes Fine Motor Movements are Fluid and Coordinated: Yes Motor  Motor Motor: Within Functional Limits   Locomotion  Ambulation Ambulation/Gait Assistance: 6: Modified independent (Device/Increase time) Stairs / Management consultant Assistance: 6: Modified independent (Device/Increase time) Curb: 6: Modified independent (Device/increase time)  Trunk/Postural Assessment  Cervical Assessment Cervical Assessment: Within Functional Limits Thoracic Assessment Thoracic Assessment: Within Functional Limits Lumbar Assessment Lumbar Assessment: Exceptions to [facility] (not formally tested due to back precautions)  Balance Balance Balance Assessed: Yes Static Sitting Balance Static Sitting - Level of Assistance: 7: Independent Dynamic Sitting Balance Dynamic Sitting - Level of Assistance: 6: Modified independent (Device/Increase time) Static Standing Balance Static Standing - Level of Assistance: 6: Modified independent (Device/Increase time) Dynamic Standing Balance Dynamic Standing - Level of Assistance: 6: Modified independent (Device/Increase time) Extremity Assessment    RUE Assessment RUE Assessment: Within Functional Limits LUE Assessment LUE Assessment: Within Functional Limits RLE Strength RLE Overall Strength Comments: WFL; decreased knee extension (3+/5); reports she was supposed to have TKA but delayed due to back sx LLE Assessment LLE Assessment: Within Functional Limits  See FIM for current functional status  Karolee Stamps [facility] 04/24/2012, 4:33 PM

## 2012-04-24 NOTE — Discharge Summary (Signed)
  Discharge summary job [phone]

## 2012-04-24 NOTE — Progress Notes (Signed)
Patient ID: Shannon Gutierrez, female   DOB: Aug 04, 1945, 66 y.o.   MRN: [id number] No new issues. Slept well. Pain under control   Objective: Vital Signs: Blood pressure 113/70, pulse 80, temperature 98.3 F (36.8 C), temperature source Oral, resp. rate 18, height 5\' 3"  (1.6 m), weight 72 kg (158 lb 11.7 oz), SpO2 97.00%. No results found. No results found for this basename: WBC:2,HGB:2,HCT:2,PLT:2 in the last 72 hours No results found for this basename: NA:2,K:2,CL:2,CO2:2,GLUCOSE:2,BUN:2,CREATININE:2,CALCIUM:2 in the last 72 hours CBG (last 3)  No results found for this basename: GLUCAP:3 in the last 72 hours  Wt Readings from Last 3 Encounters:  04/20/12 72 kg (158 lb 11.7 oz)  04/17/12 72.576 kg (160 lb)  04/17/12 72.576 kg (160 lb)    Physical Exam:  Head: Normocephalic.  Eyes:  Pupils round and reactive to light  Neck: Neck supple. No thyromegaly present.  Cardiovascular: Normal rate and regular rhythm.  Pulmonary/Chest: Breath sounds normal. No respiratory distress. She has no wheezes.  Abdominal: Bowel sounds are normal. She exhibits no distension. There is no tenderness.  Musculoskeletal:  Back appropriately tender. Good sitting posture Neurological: She is alert and oriented to person, place, and time. No cranial nerve deficit or sensory deficit.  Follows full commands. Moves all 4's with some pain inhibition weaknes proximally in the legs. dtr's are 1+  Skin:  Back incision   clean and dry with steristrips Psychiatric: She has a normal mood and affect. Her behavior is normal. Judgment and thought content normal.  Motor:5/5 in B Delt Bi Tri Grip, 4+/5 in B HF,KN, Ankle DF/PF  Sensation intact in both LE and UEs  Tone is normal     Assessment/Plan: 1. Functional deficits secondary to lumbar stenosis with radiculopathy s/p decompression and fusion which require 3+ hours per day of interdisciplinary therapy in a comprehensive inpatient rehab setting. Physiatrist is  providing close team supervision and 24 hour management of active medical problems listed below. Physiatrist and rehab team continue to assess barriers to discharge/monitor patient progress toward functional and medical goals. FIM: FIM - Bathing Bathing Steps Patient Completed: Chest;Right Arm;Front perineal area;Buttocks;Right upper leg;Left upper leg;Abdomen;Left Arm;Right lower leg (including foot);Left lower leg (including foot) Bathing: 5: Supervision: Safety issues/verbal cues  FIM - Upper Body Dressing/Undressing Upper body dressing/undressing steps patient completed: Thread/unthread right bra strap;Thread/unthread left bra strap;Hook/unhook bra;Thread/unthread right sleeve of pullover shirt/dresss;Thread/unthread left sleeve of pullover shirt/dress;Put head through opening of pull over shirt/dress;Pull shirt over trunk Upper body dressing/undressing: 5: Set-up assist to: Obtain clothing/put away FIM - Lower Body Dressing/Undressing Lower body dressing/undressing steps patient completed: Thread/unthread right underwear leg;Thread/unthread left underwear leg;Pull underwear up/down;Thread/unthread right pants leg;Thread/unthread left pants leg;Pull pants up/down;Don/Doff right sock;Don/Doff left sock;Don/Doff right shoe;Don/Doff left shoe;Fasten/unfasten right shoe;Fasten/unfasten left shoe Lower body dressing/undressing: 5: Supervision: Safety issues/verbal cues (only needed to use reacher)  FIM - Toileting Toileting steps completed by patient: Adjust clothing prior to toileting;Performs perineal hygiene;Adjust clothing after toileting Toileting Assistive Devices: Grab bar or rail for support Toileting: 5: Supervision: Safety issues/verbal cues  FIM - Diplomatic Services operational officer Devices: Grab bars;Walker;Elevated toilet seat Toilet Transfers: 5-To toilet/BSC: Supervision (verbal cues/safety issues);5-From toilet/BSC: Supervision (verbal cues/safety issues)  FIM -  Banker Devices: Walker;Bed rails Bed/Chair Transfer: 6: Supine > Sit: No assist;5: Bed > Chair or W/C: Supervision (verbal cues/safety issues);5: Chair or W/C > Bed: Supervision (verbal cues/safety issues)  FIM - Locomotion: Wheelchair Locomotion: Wheelchair: 1: Total Assistance/staff pushes wheelchair (Pt<25%) FIM -  Locomotion: Ambulation Locomotion: Ambulation Assistive Devices: Designer, industrial/product Ambulation/Gait Assistance: 5: Supervision Locomotion: Ambulation: 5: Travels 150 ft or more with supervision/safety issues  Comprehension Comprehension Mode: Auditory Comprehension: 7-Follows complex conversation/direction: With no assist  Expression Expression Mode: Verbal Expression: 7-Expresses complex ideas: With no assist  Social Interaction Social Interaction: 7-Interacts appropriately with others - No medications needed.  Problem Solving Problem Solving: 7-Solves complex problems: Recognizes & self-corrects  Memory Memory: 7-Complete Independence: No helper  Medical Problem List and Plan:  1. Lumbar stenosis/spondylosis/radiculopathy. Status post decompressive lumbar laminectomy L3-4-5 with pedicle screw fixation 04/17/2012  2. DVT Prophylaxis/Anticoagulation: SCDs. Monitor for any signs of DVT  3. Pain Management: Oxycodone and Robaxin as needed. Mobic 15 mg daily. Monitor with increased mobility  4. Mood: Paxil 20 mg daily. Provide emotional support and positive reinforcement  5. Neuropsych: This patient is capable of making decisions on his/her own behalf.  6. Hypothyroid. Synthroid  7. Constipation. Adjust bowel program as needed  8. History of overactive bladder.   9. GERD. Protonix  LOS (Days) 4 A FACE TO FACE EVALUATION WAS PERFORMED  Brittney Caraway T 04/24/2012, 7:41 AM

## 2012-04-25 MED ORDER — SENNA 8.6 MG PO TABS: 1.0000 | ORAL_TABLET | Freq: Two times a day (BID) | ORAL | Status: DC

## 2012-04-25 MED ORDER — MELOXICAM 15 MG PO TABS: 15.0000 mg | ORAL_TABLET | Freq: Every day | ORAL | Status: DC

## 2012-04-25 MED ORDER — PREDNISOLONE ACETATE 1 % OP SUSP: 1.0000 [drp] | Freq: Three times a day (TID) | OPHTHALMIC | Status: DC

## 2012-04-25 MED ORDER — METHOCARBAMOL 500 MG PO TABS: 500.0000 mg | ORAL_TABLET | Freq: Four times a day (QID) | ORAL | Status: DC | PRN

## 2012-04-25 MED ORDER — OXYCODONE HCL 5 MG PO TABS: 5.0000 mg | ORAL_TABLET | ORAL | Status: DC | PRN

## 2012-04-25 NOTE — Progress Notes (Signed)
Patient ID: Shannon Gutierrez, female   DOB: 11-Mar-1946, 66 y.o.   MRN: [id number] No new issues. Slept well. Pain under control   Objective: Vital Signs: Blood pressure 134/54, pulse 87, temperature 98.2 F (36.8 C), temperature source Oral, resp. rate 18, height 5\' 3"  (1.6 m), weight 77.2 kg (170 lb 3.1 oz), SpO2 99.00%. No results found. No results found for this basename: WBC:2,HGB:2,HCT:2,PLT:2 in the last 72 hours No results found for this basename: NA:2,K:2,CL:2,CO2:2,GLUCOSE:2,BUN:2,CREATININE:2,CALCIUM:2 in the last 72 hours CBG (last 3)  No results found for this basename: GLUCAP:3 in the last 72 hours  Wt Readings from Last 3 Encounters:  04/25/12 77.2 kg (170 lb 3.1 oz)  04/17/12 72.576 kg (160 lb)  04/17/12 72.576 kg (160 lb)    Physical Exam:  Head: Normocephalic.  Eyes:  Pupils round and reactive to light  Neck: Neck supple. No thyromegaly present.  Cardiovascular: Normal rate and regular rhythm.  Pulmonary/Chest: Breath sounds normal. No respiratory distress. She has no wheezes.  Abdominal: Bowel sounds are normal. She exhibits no distension. There is no tenderness.  Musculoskeletal:  Back appropriately tender. Good sitting posture Neurological: She is alert and oriented to person, place, and time. No cranial nerve deficit or sensory deficit.  Follows full commands. Moves all 4's with some pain inhibition weaknes proximally in the legs. dtr's are 1+  Skin:  Back incision   clean and dry with steristrips Psychiatric: She has a normal mood and affect. Her behavior is normal. Judgment and thought content normal.  Motor:5/5 in B Delt Bi Tri Grip, 4+/5 in B HF,KN, Ankle DF/PF  Sensation intact in both LE and UEs  Tone is normal     Assessment/Plan: 1. Functional deficits secondary to lumbar stenosis with radiculopathy s/p decompression and fusion which require 3+ hours per day of interdisciplinary therapy in a comprehensive inpatient rehab setting. Physiatrist is  providing close team supervision and 24 hour management of active medical problems listed below. Physiatrist and rehab team continue to assess barriers to discharge/monitor patient progress toward functional and medical goals.  Dc home today. Goals met. outpt NS follow up. outpt therapy? FIM: FIM - Bathing Bathing Steps Patient Completed: Chest;Right Arm;Left Arm;Front perineal area;Abdomen;Buttocks;Right upper leg;Left upper leg;Left lower leg (including foot);Right lower leg (including foot) Bathing: 6: Assistive device (Comment)  FIM - Upper Body Dressing/Undressing Upper body dressing/undressing steps patient completed: Thread/unthread right bra strap;Thread/unthread left bra strap;Hook/unhook bra;Thread/unthread right sleeve of pullover shirt/dresss;Thread/unthread left sleeve of pullover shirt/dress;Pull shirt over trunk;Put head through opening of pull over shirt/dress Upper body dressing/undressing: 7: Complete Independence: No helper FIM - Lower Body Dressing/Undressing Lower body dressing/undressing steps patient completed: Thread/unthread right underwear leg;Thread/unthread left underwear leg;Pull underwear up/down;Thread/unthread right pants leg;Thread/unthread left pants leg;Pull pants up/down;Don/Doff right sock;Don/Doff left sock;Don/Doff right shoe;Don/Doff left shoe;Fasten/unfasten right shoe;Fasten/unfasten left shoe Lower body dressing/undressing: 6: Assistive device (Comment)  FIM - Toileting Toileting steps completed by patient: Adjust clothing prior to toileting;Performs perineal hygiene;Adjust clothing after toileting Toileting Assistive Devices: Grab bar or rail for support Toileting: 6: Assistive device: No helper  FIM - Diplomatic Services operational officer Devices: Elevated toilet seat;Grab bars;Walker Pensions consultant Transfers: 6-More than reasonable amt of time  FIM - Banker Devices: Education officer, museum: 6:  Assistive device: no helper;6: Supine > Sit: No assist;6: Sit > Supine: No assist;6: Bed > Chair or W/C: No assist;6: Chair or W/C > Bed: No assist  FIM - Locomotion: Wheelchair Locomotion: Wheelchair: 0: Activity did not occur  FIM - Locomotion: Ambulation Locomotion: Ambulation Assistive Devices: Designer, industrial/product Ambulation/Gait Assistance: 6: Modified independent (Device/Increase time) Locomotion: Ambulation: 6: Travels 150 ft or more with assistive device/no helper  Comprehension Comprehension Mode: Auditory Comprehension: 7-Follows complex conversation/direction: With no assist  Expression Expression Mode: Verbal Expression: 7-Expresses complex ideas: With no assist  Social Interaction Social Interaction: 7-Interacts appropriately with others - No medications needed.  Problem Solving Problem Solving: 7-Solves complex problems: Recognizes & self-corrects  Memory Memory: 7-Complete Independence: No helper  Medical Problem List and Plan:  1. Lumbar stenosis/spondylosis/radiculopathy. Status post decompressive lumbar laminectomy L3-4-5 with pedicle screw fixation 04/17/2012  2. DVT Prophylaxis/Anticoagulation: SCDs. Monitor for any signs of DVT  3. Pain Management: Oxycodone and Robaxin as needed. Mobic 15 mg daily. Monitor with increased mobility  4. Mood: Paxil 20 mg daily. Provide emotional support and positive reinforcement  5. Neuropsych: This patient is capable of making decisions on his/her own behalf.  6. Hypothyroid. Synthroid  7. Constipation. Adjust bowel program as needed  8. History of overactive bladder.   9. GERD. Protonix  LOS (Days) 5 A FACE TO FACE EVALUATION WAS PERFORMED  SWARTZ,ZACHARY T 04/25/2012, 7:43 AM

## 2012-04-25 NOTE — Progress Notes (Signed)
Social Work Discharge Note Discharge Note  The overall goal for the admission was met for:   Discharge location: Yes-HOME WITH FAMILY CHECKING IN/OUT ON  Length of Stay: Yes-5 DAYS  Discharge activity level: Yes-MOD/I LEVEL  Home/community participation: Yes  Services provided included: MD, RD, PT, OT, RN, TR, Pharmacy and SW  Financial Services: Private Insurance: [employer]  Follow-up services arranged: Outpatient: [facility]-10/25 1:15-2;30 PM, DME: PROGRESSIVE [employer]-ROLLING WALKER, BSC, TUB SEAT and Patient/Family has no preference for HH/DME agencies [place] [employer] FOR DME Comments (or additional information):PT DID VERY WELL.   Patient/Family verbalized understanding of follow-up arrangements: Yes  Individual responsible for coordination of the follow-up plan: SELF AND AMBER-DAUGHTER  Confirmed correct DME delivered: Lucy Chris 04/25/2012    Lucy Chris

## 2012-04-25 NOTE — Progress Notes (Signed)
Pt discharged home with family at 10:45. Discharge instructions provided by Harvel Ricks, PA. All questions answered. Pt escorted off unit with personal belonging in w/c by Lelon Mast, NT.

## 2012-04-25 NOTE — Patient Care Conference (Signed)
Inpatient RehabilitationTeam Conference Note Date: 04/25/2012   Time: 2:00 PM    Patient Name: Shannon Gutierrez      Medical Record Number: [id number]  Date of Birth: 19-Apr-1946 Sex: Female         Room/Bed: 4009/4009-01 Payor Info: Payor: [employer]  Plan: [employer]  Product Type: *No Product type*     Admitting Diagnosis: PLIF  Admit Date/Time:  04/20/2012  3:01 PM Admission Comments: No comment available   Primary Diagnosis:  Radiculopathy of lumbar region Principal Problem: Radiculopathy of lumbar region  Patient Active Problem List   Diagnosis Date Noted  . Radiculopathy of lumbar region 04/20/2012    Expected Discharge Date: Expected Discharge Date: 04/25/12  Team Members Present: Physician: Dr. Faith Rogue Social Worker Present: Dossie Der, LCSW Nurse Present: Daryll Brod, RN PT Present: Karolee Stamps, PT OT Present: Edwin Cap, OT SLP Present: Feliberto Gottron, SLP Other (Discipline and Name): Charolette Child Coordinator     Current Status/Progress Goal Weekly Team Focus  Medical   pain controlled ready to go home. wound intact  independence with functional mobility and self-care, pain control  see above   Bowel/Bladder   Continent of bowel and bladder. Last bowel movement 04/24/12  Pt to remain continent of bowel and bladder  Monitor   Swallow/Nutrition/ Hydration     n/a   n/a     ADL's     mod/i with tub seat & bsc   mod/i with tub seat & bsc   all goals met at discharge  Mobility   mod I with RW  mod I with RW  all goals met. D/c   Communication             Safety/Cognition/ Behavioral Observations            Pain   Oxy IR 5-10mg  q hrs prn  <3  Monitor for effectiveness   Skin   Lumbar incision with steristrips, cdi  No additional skin breakdown  Remind pt to turn q 2hrs      *See Interdisciplinary Assessment and Plan and progress notes for long and short-term goals  Barriers to Discharge: none    Possible  Resolutions to Barriers:  none    Discharge Planning/Teaching Needs:  Home with family in and out-did very well and is ready to go home.      Team Discussion:  Pt made good progress and is ready for discharge.  Reached Mod/i level goals and family to check on intermittently.  OPPT follow up  Revisions to Treatment Plan:  None   Continued Need for Acute Rehabilitation Level of Care: The patient requires daily medical management by a physician with specialized training in physical medicine and rehabilitation for the following conditions: Daily direction of a multidisciplinary physical rehabilitation program to ensure safe treatment while eliciting the highest outcome that is of practical value to the patient.: Yes Daily medical management of patient stability for increased activity during participation in an intensive rehabilitation regime.: Yes Daily analysis of laboratory values and/or radiology reports with any subsequent need for medication adjustment of medical intervention for : Neurological problems;Post surgical problems  Brezlyn Manrique, Lemar Livings 04/25/2012, 1:25 PM

## 2012-04-28 ENCOUNTER — Ambulatory Visit: Payer: Worker's Compensation | Admitting: Physical Therapy

## 2012-05-03 NOTE — Discharge Summary (Signed)
  Physician Discharge Summary  Patient ID: Shannon Gutierrez MRN: [id number] DOB/AGE: Sep 15, 1945 66 y.o.  Admit date: 04/17/2012 Discharge date: 05/03/2012  Admission Diagnoses: Degenerative disc disease lumbar spinal stenosis L3-4 L4-5  Discharge Diagnoses: Same Active Problems:  * No active hospital problems. *    Discharged Condition: good  Hospital Course: Patient was admitted hospital underwent an L3-4 and L4-5 posterior lumbar interbody fusion postoperatively patient did very well with recovered in the floor on the floor patient convalesced well had a lot of back pain initially but had significant improvement preoperative leg pain outpatient slowly with saline mobilize her Foley was a be taken out of 10 was DC'd she was transitioned to oral analgesics however her persistent pain in her social situation home ultimately culminated patient need to be placed at rehabilitation patient had sustained initially a work-related injury so what we have rehabilitation evaluate her they thought she was appropriate for transfer and accepted to rehabilitation on postop day 4 at the time of discharge patient was angling and voiding spontaneously tolerating regular diet and pain was controlled on oral analgesics  Consults: Significant Diagnostic Studies: Treatments: L3-4 L4-5 posterior lumbar interbody fusion Discharge Exam: Blood pressure 117/45, pulse 95, temperature 98.9 F (37.2 C), temperature source Oral, resp. rate 18, height 5\' 3"  (1.6 m), weight 72.576 kg (160 lb), SpO2 97.00%. Strength out of 5 wound clean and dry  Disposition: Rehabilitation     Medication List     As of 05/03/2012  3:57 PM    STOP taking these medications         meloxicam 15 MG tablet   Commonly known as: MOBIC      TAKE these medications         acyclovir 400 MG tablet   Commonly known as: ZOVIRAX   Take 400 mg by mouth 2 (two) times daily as needed. For cold sore flare ups      estradiol 0.5 MG tablet     Commonly known as: ESTRACE   Take 0.5 mg by mouth daily.      levothyroxine 88 MCG tablet   Commonly known as: SYNTHROID, LEVOTHROID   Take 88 mcg by mouth daily.      multivitamin with minerals Tabs   Take 1 tablet by mouth daily.      omeprazole 20 MG capsule   Commonly known as: PRILOSEC   Take 20 mg by mouth daily.      PARoxetine 20 MG tablet   Commonly known as: PAXIL   Take 20 mg by mouth every morning.         Signed: Gitty Osterlund P 05/03/2012, 3:57 PM

## 2013-09-06 ENCOUNTER — Other Ambulatory Visit: Payer: Self-pay | Admitting: Neurosurgery

## 2013-09-20 ENCOUNTER — Encounter (HOSPITAL_COMMUNITY): Payer: Self-pay | Admitting: Pharmacy Technician

## 2013-09-24 ENCOUNTER — Encounter (HOSPITAL_COMMUNITY): Payer: Self-pay

## 2013-09-24 ENCOUNTER — Encounter (HOSPITAL_COMMUNITY)
Admission: RE | Admit: 2013-09-24 | Discharge: 2013-09-24 | Disposition: A | Discharge status: Home / Self Care | Payer: Medicare Other | Source: Ambulatory Visit | Attending: Neurosurgery | Admitting: Neurosurgery

## 2013-09-24 DIAGNOSIS — Z01812 Encounter for preprocedural laboratory examination: Secondary | ICD-10-CM | POA: Insufficient documentation

## 2013-09-24 HISTORY — DX: Effusion, unspecified joint: M25.40

## 2013-09-24 HISTORY — DX: Other chronic pain: G89.29

## 2013-09-24 HISTORY — DX: Headache: R51

## 2013-09-24 HISTORY — DX: Other specified postprocedural states: Z98.890

## 2013-09-24 HISTORY — DX: Personal history of other diseases of the respiratory system: Z87.09

## 2013-09-24 HISTORY — DX: Gastro-esophageal reflux disease without esophagitis: K21.9

## 2013-09-24 HISTORY — DX: Weakness: R53.1

## 2013-09-24 HISTORY — DX: Diverticulosis of intestine, part unspecified, without perforation or abscess without bleeding: K57.90

## 2013-09-24 HISTORY — DX: Urgency of urination: R39.15

## 2013-09-24 HISTORY — DX: Personal history of urinary calculi: Z87.442

## 2013-09-24 HISTORY — DX: Hyperlipidemia, unspecified: E78.5

## 2013-09-24 HISTORY — DX: Nausea with vomiting, unspecified: R11.2

## 2013-09-24 HISTORY — DX: Pain in unspecified joint: M25.50

## 2013-09-24 HISTORY — DX: Dorsalgia, unspecified: M54.9

## 2013-09-24 LAB — CBC
HEMATOCRIT: 42.8 % (ref 36.0–46.0)
HEMOGLOBIN: 14.8 g/dL (ref 12.0–15.0)
MCH: 30.1 pg (ref 26.0–34.0)
MCHC: 34.6 g/dL (ref 30.0–36.0)
MCV: 87.2 fL (ref 78.0–100.0)
Platelets: 293 10*3/uL (ref 150–400)
RBC: 4.91 MIL/uL (ref 3.87–5.11)
RDW: 13.1 % (ref 11.5–15.5)
WBC: 10.9 10*3/uL — ABNORMAL HIGH (ref 4.0–10.5)

## 2013-09-24 LAB — TYPE AND SCREEN
ABO/RH(D): O NEG
Antibody Screen: NEGATIVE

## 2013-09-24 LAB — SURGICAL PCR SCREEN
MRSA, PCR: NEGATIVE
Staphylococcus aureus: NEGATIVE

## 2013-09-24 LAB — BASIC METABOLIC PANEL
BUN: 15 mg/dL (ref 6–23)
CHLORIDE: 105 meq/L (ref 96–112)
CO2: 25 meq/L (ref 19–32)
Calcium: 9.1 mg/dL (ref 8.4–10.5)
Creatinine, Ser: 0.71 mg/dL (ref 0.50–1.10)
GFR calc Af Amer: >90 mL/min (ref >90)
GFR, EST NON AFRICAN AMERICAN: 87 mL/min — AB (ref >90)
GLUCOSE: 107 mg/dL — AB (ref 70–99)
POTASSIUM: 4.5 meq/L (ref 3.7–5.3)
Sodium: 143 mEq/L (ref 137–147)

## 2013-09-24 NOTE — Pre-Procedure Instructions (Signed)
Shannon BarbaraBarbara T Gutierrez  09/24/2013   Your procedure is scheduled on:  Wed, April 1 @ 9:30 AM  [facility] 7:30 AM.  Call this number if you have problems the morning of surgery: [phone]   Remember:   Do not eat food or drink liquids after midnight.   Take these medicines the morning of surgery with A SIP OF WATER: Acyclovir(Zovirax-if needed),Pain Pill(if needed),Synthroid(Levothyroxine),and Paxil(Paroxetine)                No Goody's,BC's,Aleve,Aspirin,Ibuprofen,Fish Oil,or any Herbal Medications   Do not wear jewelry, make-up or nail polish.  Do not wear lotions, powders, or perfumes. You may wear deodorant.  Do not shave 48 hours prior to surgery.   Do not bring valuables to the hospital.  [facility]                  for any belongings or valuables.               Contacts, dentures or bridgework may not be worn into surgery.  Leave suitcase in the car. After surgery it may be brought to your room.  For patients admitted to the hospital, discharge time is determined by your                treatment team.               Special Instructions:  [place] - Preparing for Surgery  Before surgery, you can play an important role.  Because skin is not sterile, your skin needs to be as free of germs as possible.  You can reduce the number of germs on you skin by washing with CHG (chlorahexidine gluconate) soap before surgery.  CHG is an antiseptic cleaner which kills germs and bonds with the skin to continue killing germs even after washing.  Please DO NOT use if you have an allergy to CHG or antibacterial soaps.  If your skin becomes reddened/irritated stop using the CHG and inform your nurse when you arrive at Short Stay.  Do not shave (including legs and underarms) for at least 48 hours prior to the first CHG shower.  You may shave your face.  Please follow these instructions carefully:   1.  Shower with CHG Soap the night before surgery and  the                                morning of Surgery.  2.  If you choose to wash your hair, wash your hair first as usual with your       normal shampoo.  3.  After you shampoo, rinse your hair and body thoroughly to remove the                      Shampoo.  4.  Use CHG as you would any other liquid soap.  You can apply chg directly       to the skin and wash gently with scrungie or a clean washcloth.  5.  Apply the CHG Soap to your body ONLY FROM THE NECK DOWN.        Do not use on open wounds or open sores.  Avoid contact with your eyes,       ears, mouth and genitals (private parts).  Wash genitals (private parts)       with your  normal soap.  6.  Wash thoroughly, paying special attention to the area where your surgery        will be performed.  7.  Thoroughly rinse your body with warm water from the neck down.  8.  DO NOT shower/wash with your normal soap after using and rinsing off       the CHG Soap.  9.  Pat yourself dry with a clean towel.            10.  Wear clean pajamas.            11.  Place clean sheets on your bed the night of your first shower and do not        sleep with pets.  Day of Surgery  Do not apply any lotions/deoderants the morning of surgery.  Please wear clean clothes to the hospital/surgery center.     Please read over the following fact sheets that you were given: Pain Booklet, Coughing and Deep Breathing, Blood Transfusion Information, MRSA Information and Surgical Site Infection Prevention

## 2013-09-24 NOTE — Progress Notes (Signed)
Pt doesn't have a cardiologist  Denies ever having an echo/stress test/heart cath  Medical Md is Dr.William Cho with [facility] in [place]  Denies EKG or CXR in past yr

## 2013-10-02 MED ORDER — VANCOMYCIN HCL IN DEXTROSE 1-5 GM/200ML-% IV SOLN
1000.0000 mg | INTRAVENOUS | Status: AC
  Administered 2013-10-03: 1000 mg via INTRAVENOUS
  Filled 2013-10-02: qty 200

## 2013-10-02 NOTE — Progress Notes (Signed)
Notified patient of time change, instructed patient to arrive at 630 am on 10/03/13.

## 2013-10-03 ENCOUNTER — Inpatient Hospital Stay (HOSPITAL_COMMUNITY): Payer: PRIVATE HEALTH INSURANCE | Admitting: Anesthesiology

## 2013-10-03 ENCOUNTER — Inpatient Hospital Stay (HOSPITAL_COMMUNITY): Payer: PRIVATE HEALTH INSURANCE

## 2013-10-03 ENCOUNTER — Encounter (HOSPITAL_COMMUNITY): Payer: Self-pay | Admitting: *Deleted

## 2013-10-03 ENCOUNTER — Inpatient Hospital Stay (HOSPITAL_COMMUNITY)
Admission: RE | Admit: 2013-10-03 | Discharge: 2013-10-06 | DRG: 460 | Disposition: A | Discharge status: Home / Self Care | Payer: PRIVATE HEALTH INSURANCE | Source: Ambulatory Visit | Attending: Neurosurgery | Admitting: Neurosurgery

## 2013-10-03 ENCOUNTER — Encounter (HOSPITAL_COMMUNITY)
Admission: RE | Disposition: A | Discharge status: Home / Self Care | Payer: Medicare Other | Source: Ambulatory Visit | Attending: Neurosurgery

## 2013-10-03 ENCOUNTER — Encounter (HOSPITAL_COMMUNITY): Payer: PRIVATE HEALTH INSURANCE | Admitting: Anesthesiology

## 2013-10-03 DIAGNOSIS — M47817 Spondylosis without myelopathy or radiculopathy, lumbosacral region: Secondary | ICD-10-CM | POA: Diagnosis present

## 2013-10-03 DIAGNOSIS — Z79899 Other long term (current) drug therapy: Secondary | ICD-10-CM

## 2013-10-03 DIAGNOSIS — G8929 Other chronic pain: Secondary | ICD-10-CM | POA: Diagnosis present

## 2013-10-03 DIAGNOSIS — F411 Generalized anxiety disorder: Secondary | ICD-10-CM | POA: Diagnosis present

## 2013-10-03 DIAGNOSIS — E039 Hypothyroidism, unspecified: Secondary | ICD-10-CM | POA: Diagnosis present

## 2013-10-03 DIAGNOSIS — M48061 Spinal stenosis, lumbar region without neurogenic claudication: Secondary | ICD-10-CM | POA: Diagnosis present

## 2013-10-03 DIAGNOSIS — K219 Gastro-esophageal reflux disease without esophagitis: Secondary | ICD-10-CM | POA: Diagnosis present

## 2013-10-03 DIAGNOSIS — M51379 Other intervertebral disc degeneration, lumbosacral region without mention of lumbar back pain or lower extremity pain: Principal | ICD-10-CM | POA: Diagnosis present

## 2013-10-03 DIAGNOSIS — M5137 Other intervertebral disc degeneration, lumbosacral region: Principal | ICD-10-CM | POA: Diagnosis present

## 2013-10-03 DIAGNOSIS — E785 Hyperlipidemia, unspecified: Secondary | ICD-10-CM | POA: Diagnosis present

## 2013-10-03 DIAGNOSIS — M1710 Unilateral primary osteoarthritis, unspecified knee: Secondary | ICD-10-CM | POA: Diagnosis present

## 2013-10-03 DIAGNOSIS — Z87891 Personal history of nicotine dependence: Secondary | ICD-10-CM

## 2013-10-03 SURGERY — POSTERIOR LUMBAR FUSION 1 LEVEL
Anesthesia: General | Site: Back

## 2013-10-03 MED ORDER — ARTIFICIAL TEARS OP OINT
TOPICAL_OINTMENT | OPHTHALMIC | Status: AC
  Filled 2013-10-03: qty 3.5

## 2013-10-03 MED ORDER — SODIUM CHLORIDE 0.9 % IJ SOLN
INTRAMUSCULAR | Status: AC
  Filled 2013-10-03: qty 10

## 2013-10-03 MED ORDER — LIDOCAINE-EPINEPHRINE 1 %-1:100000 IJ SOLN
INTRAMUSCULAR | Status: DC | PRN
  Administered 2013-10-03 (×2): 5 mL

## 2013-10-03 MED ORDER — ACETAMINOPHEN 325 MG PO TABS: 650.0000 mg | ORAL_TABLET | ORAL | Status: DC | PRN

## 2013-10-03 MED ORDER — FENTANYL CITRATE 0.05 MG/ML IJ SOLN: 25.0000 ug | INTRAMUSCULAR | Status: DC | PRN

## 2013-10-03 MED ORDER — FENTANYL CITRATE 0.05 MG/ML IJ SOLN
INTRAMUSCULAR | Status: AC
  Filled 2013-10-03: qty 2

## 2013-10-03 MED ORDER — OXYCODONE-ACETAMINOPHEN 5-325 MG PO TABS
1.0000 | ORAL_TABLET | ORAL | Status: DC | PRN
  Administered 2013-10-05: 2 via ORAL
  Administered 2013-10-06: 1 via ORAL
  Filled 2013-10-03: qty 1
  Filled 2013-10-03: qty 2

## 2013-10-03 MED ORDER — SODIUM CHLORIDE 0.9 % IJ SOLN
3.0000 mL | Freq: Two times a day (BID) | INTRAMUSCULAR | Status: DC
  Administered 2013-10-03: 3 mL via INTRAVENOUS

## 2013-10-03 MED ORDER — NEOSTIGMINE METHYLSULFATE 1 MG/ML IJ SOLN
INTRAMUSCULAR | Status: AC
  Filled 2013-10-03: qty 10

## 2013-10-03 MED ORDER — PAROXETINE HCL 20 MG PO TABS
20.0000 mg | ORAL_TABLET | ORAL | Status: DC
  Administered 2013-10-04 – 2013-10-06 (×3): 20 mg via ORAL
  Filled 2013-10-03 (×6): qty 1

## 2013-10-03 MED ORDER — SUFENTANIL CITRATE 50 MCG/ML IV SOLN
INTRAVENOUS | Status: AC
  Filled 2013-10-03: qty 1

## 2013-10-03 MED ORDER — MIDAZOLAM HCL 5 MG/5ML IJ SOLN
INTRAMUSCULAR | Status: DC | PRN
  Administered 2013-10-03: 2 mg via INTRAVENOUS

## 2013-10-03 MED ORDER — THROMBIN 20000 UNITS EX SOLR
CUTANEOUS | Status: DC | PRN
  Administered 2013-10-03: 09:00:00 via TOPICAL

## 2013-10-03 MED ORDER — SUFENTANIL CITRATE 50 MCG/ML IV SOLN
INTRAVENOUS | Status: DC | PRN
  Administered 2013-10-03 (×2): 5 ug via INTRAVENOUS
  Administered 2013-10-03: 15 ug via INTRAVENOUS
  Administered 2013-10-03 (×2): 10 ug via INTRAVENOUS
  Administered 2013-10-03 (×2): 5 ug via INTRAVENOUS

## 2013-10-03 MED ORDER — MIDAZOLAM HCL 2 MG/2ML IJ SOLN
INTRAMUSCULAR | Status: AC
  Filled 2013-10-03: qty 2

## 2013-10-03 MED ORDER — CYCLOBENZAPRINE HCL 10 MG PO TABS
10.0000 mg | ORAL_TABLET | Freq: Three times a day (TID) | ORAL | Status: DC | PRN
  Administered 2013-10-03 – 2013-10-04 (×2): 10 mg via ORAL
  Filled 2013-10-03 (×2): qty 1

## 2013-10-03 MED ORDER — PROPOFOL 10 MG/ML IV BOLUS
INTRAVENOUS | Status: DC | PRN
  Administered 2013-10-03: 170 mg via INTRAVENOUS

## 2013-10-03 MED ORDER — PROPOFOL 10 MG/ML IV BOLUS
INTRAVENOUS | Status: AC
  Filled 2013-10-03: qty 20

## 2013-10-03 MED ORDER — HYDROCODONE-ACETAMINOPHEN 5-325 MG PO TABS
1.0000 | ORAL_TABLET | Freq: Four times a day (QID) | ORAL | Status: DC | PRN
  Administered 2013-10-03 – 2013-10-05 (×6): 1 via ORAL
  Filled 2013-10-03 (×6): qty 1

## 2013-10-03 MED ORDER — ROCURONIUM BROMIDE 50 MG/5ML IV SOLN
INTRAVENOUS | Status: AC
  Filled 2013-10-03: qty 2

## 2013-10-03 MED ORDER — VANCOMYCIN HCL IN DEXTROSE 750-5 MG/150ML-% IV SOLN
750.0000 mg | Freq: Two times a day (BID) | INTRAVENOUS | Status: DC
  Administered 2013-10-03 – 2013-10-04 (×3): 750 mg via INTRAVENOUS
  Filled 2013-10-03 (×6): qty 150

## 2013-10-03 MED ORDER — ACETAMINOPHEN 10 MG/ML IV SOLN
INTRAVENOUS | Status: AC
Start: 2013-10-03 — End: 2013-10-03
  Administered 2013-10-03: 1000 mg via INTRAVENOUS
  Filled 2013-10-03: qty 100

## 2013-10-03 MED ORDER — 0.9 % SODIUM CHLORIDE (POUR BTL) OPTIME
TOPICAL | Status: DC | PRN
  Administered 2013-10-03: 1000 mL

## 2013-10-03 MED ORDER — ADULT MULTIVITAMIN W/MINERALS CH
1.0000 | ORAL_TABLET | Freq: Every day | ORAL | Status: DC
  Administered 2013-10-03 – 2013-10-06 (×4): 1 via ORAL
  Filled 2013-10-03 (×4): qty 1

## 2013-10-03 MED ORDER — ACETAMINOPHEN 650 MG RE SUPP: 650.0000 mg | RECTAL | Status: DC | PRN

## 2013-10-03 MED ORDER — SIMVASTATIN 5 MG PO TABS
5.0000 mg | ORAL_TABLET | Freq: Every day | ORAL | Status: DC
  Administered 2013-10-03 – 2013-10-05 (×3): 5 mg via ORAL
  Filled 2013-10-03 (×4): qty 1

## 2013-10-03 MED ORDER — ONDANSETRON HCL 4 MG/2ML IJ SOLN
INTRAMUSCULAR | Status: DC | PRN
  Administered 2013-10-03: 4 mg via INTRAVENOUS

## 2013-10-03 MED ORDER — GLYCOPYRROLATE 0.2 MG/ML IJ SOLN
INTRAMUSCULAR | Status: DC | PRN
  Administered 2013-10-03: .5 mg via INTRAVENOUS

## 2013-10-03 MED ORDER — LEVOTHYROXINE SODIUM 88 MCG PO TABS
88.0000 ug | ORAL_TABLET | Freq: Every day | ORAL | Status: DC
  Administered 2013-10-04 – 2013-10-06 (×3): 88 ug via ORAL
  Filled 2013-10-03 (×4): qty 1

## 2013-10-03 MED ORDER — CELECOXIB 200 MG PO CAPS
200.0000 mg | ORAL_CAPSULE | Freq: Every day | ORAL | Status: DC | PRN
  Filled 2013-10-03: qty 2

## 2013-10-03 MED ORDER — NEOSTIGMINE METHYLSULFATE 1 MG/ML IJ SOLN
INTRAMUSCULAR | Status: DC | PRN
  Administered 2013-10-03: 3 mg via INTRAVENOUS

## 2013-10-03 MED ORDER — HYDROMORPHONE HCL PF 1 MG/ML IJ SOLN
INTRAMUSCULAR | Status: AC
  Filled 2013-10-03: qty 1

## 2013-10-03 MED ORDER — SODIUM CHLORIDE 0.9 % IR SOLN
Status: DC | PRN
  Administered 2013-10-03: 09:00:00

## 2013-10-03 MED ORDER — FENTANYL CITRATE 0.05 MG/ML IJ SOLN
25.0000 ug | INTRAMUSCULAR | Status: DC | PRN
  Administered 2013-10-03 (×2): 25 ug via INTRAVENOUS

## 2013-10-03 MED ORDER — SODIUM CHLORIDE 0.9 % IJ SOLN: 3.0000 mL | INTRAMUSCULAR | Status: DC | PRN

## 2013-10-03 MED ORDER — GLYCOPYRROLATE 0.2 MG/ML IJ SOLN
INTRAMUSCULAR | Status: AC
  Filled 2013-10-03: qty 2

## 2013-10-03 MED ORDER — LIDOCAINE HCL (CARDIAC) 20 MG/ML IV SOLN
INTRAVENOUS | Status: AC
  Filled 2013-10-03: qty 5

## 2013-10-03 MED ORDER — BUPIVACAINE HCL (PF) 0.25 % IJ SOLN
INTRAMUSCULAR | Status: DC | PRN
  Administered 2013-10-03 (×2): 5 mL

## 2013-10-03 MED ORDER — MENTHOL 3 MG MT LOZG: 1.0000 | LOZENGE | OROMUCOSAL | Status: DC | PRN

## 2013-10-03 MED ORDER — POLYVINYL ALCOHOL 1.4 % OP SOLN
1.0000 [drp] | OPHTHALMIC | Status: DC | PRN
  Administered 2013-10-03: 1 [drp] via OPHTHALMIC
  Filled 2013-10-03 (×2): qty 15

## 2013-10-03 MED ORDER — ONDANSETRON HCL 4 MG/2ML IJ SOLN: 4.0000 mg | INTRAMUSCULAR | Status: DC | PRN

## 2013-10-03 MED ORDER — ONDANSETRON HCL 4 MG/2ML IJ SOLN
INTRAMUSCULAR | Status: AC
  Filled 2013-10-03: qty 2

## 2013-10-03 MED ORDER — GLYCOPYRROLATE 0.2 MG/ML IJ SOLN
INTRAMUSCULAR | Status: AC
  Filled 2013-10-03: qty 1

## 2013-10-03 MED ORDER — LACTATED RINGERS IV SOLN
INTRAVENOUS | Status: DC | PRN
  Administered 2013-10-03 (×3): via INTRAVENOUS

## 2013-10-03 MED ORDER — PHENOL 1.4 % MT LIQD
1.0000 | OROMUCOSAL | Status: DC | PRN
  Administered 2013-10-03: 1 via OROMUCOSAL
  Filled 2013-10-03: qty 177

## 2013-10-03 MED ORDER — SODIUM CHLORIDE 0.9 % IV SOLN: 250.0000 mL | INTRAVENOUS | Status: DC

## 2013-10-03 MED ORDER — LIDOCAINE HCL (CARDIAC) 20 MG/ML IV SOLN
INTRAVENOUS | Status: DC | PRN
  Administered 2013-10-03: 60 mg via INTRAVENOUS

## 2013-10-03 MED ORDER — HYDROMORPHONE HCL PF 1 MG/ML IJ SOLN: 0.5000 mg | INTRAMUSCULAR | Status: DC | PRN

## 2013-10-03 MED ORDER — PANTOPRAZOLE SODIUM 40 MG PO TBEC
40.0000 mg | DELAYED_RELEASE_TABLET | Freq: Every day | ORAL | Status: DC
  Administered 2013-10-03 – 2013-10-06 (×4): 40 mg via ORAL
  Filled 2013-10-03 (×4): qty 1

## 2013-10-03 MED ORDER — ROCURONIUM BROMIDE 100 MG/10ML IV SOLN
INTRAVENOUS | Status: DC | PRN
  Administered 2013-10-03 (×3): 10 mg via INTRAVENOUS
  Administered 2013-10-03: 50 mg via INTRAVENOUS

## 2013-10-03 MED ORDER — DOCUSATE SODIUM 100 MG PO CAPS
100.0000 mg | ORAL_CAPSULE | Freq: Two times a day (BID) | ORAL | Status: DC
  Administered 2013-10-04 – 2013-10-06 (×5): 100 mg via ORAL
  Filled 2013-10-03 (×5): qty 1

## 2013-10-03 SURGICAL SUPPLY — 86 items
BAG DECANTER FOR FLEXI CONT (MISCELLANEOUS) ×3 IMPLANT
BENZOIN TINCTURE PRP APPL 2/3 (GAUZE/BANDAGES/DRESSINGS) ×3 IMPLANT
BLADE SURG 11 STRL SS (BLADE) ×3 IMPLANT
BLADE SURG ROTATE 9660 (MISCELLANEOUS) IMPLANT
BRUSH SCRUB EZ PLAIN DRY (MISCELLANEOUS) ×3 IMPLANT
BUR MATCHSTICK NEURO 3.0 LAGG (BURR) ×3 IMPLANT
BUR PRECISION FLUTE 6.0 (BURR) ×3 IMPLANT
CANISTER SUCT 3000ML (MISCELLANEOUS) ×3 IMPLANT
CAP LOCKING REVERE (Cap) ×15 IMPLANT
CAP LOCKING THREADED (Cap) ×6 IMPLANT
CLOSURE WOUND 1/2 X4 (GAUZE/BANDAGES/DRESSINGS) ×1
CONT SPEC 4OZ CLIKSEAL STRL BL (MISCELLANEOUS) ×9 IMPLANT
COVER BACK TABLE 24X17X13 BIG (DRAPES) IMPLANT
COVER TABLE BACK 60X90 (DRAPES) ×3 IMPLANT
CROSSLINK SPINAL FUSION (Cage) ×3 IMPLANT
DECANTER SPIKE VIAL GLASS SM (MISCELLANEOUS) ×3 IMPLANT
DERMABOND ADHESIVE PROPEN (GAUZE/BANDAGES/DRESSINGS) ×2
DERMABOND ADVANCED (GAUZE/BANDAGES/DRESSINGS) ×2
DERMABOND ADVANCED .7 DNX12 (GAUZE/BANDAGES/DRESSINGS) ×1 IMPLANT
DERMABOND ADVANCED .7 DNX6 (GAUZE/BANDAGES/DRESSINGS) ×1 IMPLANT
DRAPE C-ARM 42X72 X-RAY (DRAPES) ×6 IMPLANT
DRAPE LAPAROTOMY 100X72X124 (DRAPES) ×3 IMPLANT
DRAPE POUCH INSTRU U-SHP 10X18 (DRAPES) ×3 IMPLANT
DRAPE PROXIMA HALF (DRAPES) ×3 IMPLANT
DRAPE SURG 17X23 STRL (DRAPES) ×3 IMPLANT
DRSG OPSITE 4X5.5 SM (GAUZE/BANDAGES/DRESSINGS) ×3 IMPLANT
DRSG OPSITE POSTOP 4X8 (GAUZE/BANDAGES/DRESSINGS) ×3 IMPLANT
DURAPREP 26ML APPLICATOR (WOUND CARE) ×3 IMPLANT
ELECT REM PT RETURN 9FT ADLT (ELECTROSURGICAL) ×3
ELECTRODE REM PT RTRN 9FT ADLT (ELECTROSURGICAL) ×1 IMPLANT
EVACUATOR 3/16  PVC DRAIN (DRAIN) ×2
EVACUATOR 3/16 PVC DRAIN (DRAIN) ×1 IMPLANT
GAUZE SPONGE 4X4 16PLY XRAY LF (GAUZE/BANDAGES/DRESSINGS) IMPLANT
GLOVE BIO SURGEON STRL SZ8 (GLOVE) ×6 IMPLANT
GLOVE BIO SURGEON STRL SZ8.5 (GLOVE) ×3 IMPLANT
GLOVE BIOGEL PI IND STRL 7.0 (GLOVE) ×2 IMPLANT
GLOVE BIOGEL PI IND STRL 7.5 (GLOVE) ×4 IMPLANT
GLOVE BIOGEL PI IND STRL 8.5 (GLOVE) ×1 IMPLANT
GLOVE BIOGEL PI INDICATOR 7.0 (GLOVE) ×4
GLOVE BIOGEL PI INDICATOR 7.5 (GLOVE) ×8
GLOVE BIOGEL PI INDICATOR 8.5 (GLOVE) ×2
GLOVE ECLIPSE 7.0 STRL STRAW (GLOVE) ×6 IMPLANT
GLOVE ECLIPSE 7.5 STRL STRAW (GLOVE) IMPLANT
GLOVE EXAM NITRILE LRG STRL (GLOVE) IMPLANT
GLOVE EXAM NITRILE MD LF STRL (GLOVE) IMPLANT
GLOVE EXAM NITRILE XL STR (GLOVE) IMPLANT
GLOVE EXAM NITRILE XS STR PU (GLOVE) IMPLANT
GLOVE INDICATOR 8.5 STRL (GLOVE) ×6 IMPLANT
GLOVE SS BIOGEL STRL SZ 6.5 (GLOVE) ×3 IMPLANT
GLOVE SS BIOGEL STRL SZ 8 (GLOVE) ×1 IMPLANT
GLOVE SUPERSENSE BIOGEL SZ 6.5 (GLOVE) ×6
GLOVE SUPERSENSE BIOGEL SZ 8 (GLOVE) ×2
GOWN BRE IMP SLV AUR LG STRL (GOWN DISPOSABLE) IMPLANT
GOWN BRE IMP SLV AUR XL STRL (GOWN DISPOSABLE) ×6 IMPLANT
GOWN STRL REIN 2XL LVL4 (GOWN DISPOSABLE) IMPLANT
GOWN STRL REUS W/ TWL LRG LVL3 (GOWN DISPOSABLE) ×1 IMPLANT
GOWN STRL REUS W/ TWL XL LVL3 (GOWN DISPOSABLE) ×3 IMPLANT
GOWN STRL REUS W/TWL 2XL LVL3 (GOWN DISPOSABLE) ×6 IMPLANT
GOWN STRL REUS W/TWL LRG LVL3 (GOWN DISPOSABLE) ×2
GOWN STRL REUS W/TWL XL LVL3 (GOWN DISPOSABLE) ×6
KIT BASIN OR (CUSTOM PROCEDURE TRAY) ×3 IMPLANT
KIT INFUSE XX SMALL 0.7CC (Orthopedic Implant) ×3 IMPLANT
KIT ROOM TURNOVER OR (KITS) ×3 IMPLANT
MILL MEDIUM DISP (BLADE) ×3 IMPLANT
NEEDLE HYPO 25X1 1.5 SAFETY (NEEDLE) ×3 IMPLANT
NS IRRIG 1000ML POUR BTL (IV SOLUTION) ×3 IMPLANT
PACK LAMINECTOMY NEURO (CUSTOM PROCEDURE TRAY) ×3 IMPLANT
PAD ARMBOARD 7.5X6 YLW CONV (MISCELLANEOUS) ×9 IMPLANT
ROD 85MM SPINAL (Rod) ×3 IMPLANT
ROD SPINAL CREO 95MM (Rod) ×3 IMPLANT
SCREW CREO 6.5X40 (Screw) ×6 IMPLANT
SCREW PA THRD CREO TULIP 5.5X4 (Head) ×6 IMPLANT
SPONGE GAUZE 4X4 12PLY (GAUZE/BANDAGES/DRESSINGS) ×3 IMPLANT
SPONGE LAP 4X18 X RAY DECT (DISPOSABLE) IMPLANT
SPONGE SURGIFOAM ABS GEL 100 (HEMOSTASIS) ×3 IMPLANT
STRIP BIOACTIVE 5CC 25X50X4MM (Miscellaneous) ×3 IMPLANT
STRIP CLOSURE SKIN 1/2X4 (GAUZE/BANDAGES/DRESSINGS) ×2 IMPLANT
SUT VIC AB 0 CT1 18XCR BRD8 (SUTURE) ×2 IMPLANT
SUT VIC AB 0 CT1 8-18 (SUTURE) ×4
SUT VIC AB 2-0 CT1 18 (SUTURE) ×3 IMPLANT
SUT VICRYL 4-0 PS2 18IN ABS (SUTURE) ×3 IMPLANT
SYR 20ML ECCENTRIC (SYRINGE) ×3 IMPLANT
TOWEL OR 17X24 6PK STRL BLUE (TOWEL DISPOSABLE) ×3 IMPLANT
TOWEL OR 17X26 10 PK STRL BLUE (TOWEL DISPOSABLE) ×3 IMPLANT
TRAY FOLEY CATH 14FRSI W/METER (CATHETERS) ×3 IMPLANT
WATER STERILE IRR 1000ML POUR (IV SOLUTION) ×3 IMPLANT

## 2013-10-03 NOTE — H&P (Signed)
Shannon Gutierrez is an 68 y.o. female.   Chief Complaint: Back and right leg pain HPI: Patient is a very pleasant 67 year old woman sustained a work-related injury resulting in disc herniations at L3-4 and L4-5 patient underwent an L3-L5 fusion and did very well however after surgery latent developed progressive worsening low back and right leg pain rate in the back of her leg to the back of her calf occasionally the outside of Monsel consistent with an S1 and occasionally some L5 top or foot numbness tingling and pain. Physical therapy and time. Workup has revealed significant degenerative disc disease and collapse at L5-S1 with severe stenosis of both the L5 and S1 foramen the fusion appeared to be solid at L3-4 and L4-5 however did appear that the L5 screw on the right was slightly piercing the anterior cortical margin. Because of all this and her failure of conservative treatment I recommended a reexploration of fusion removal of hardware retaining the 3 and the 4 screws removal of the right L5 screw either redirected or placed with a shorter screw and then an interbody fusion L5-S1 centered over the risks and benefits of the operation with her as well as perioperative course expectations of outcome and alternatives surgery she understands and agrees to proceed forward.  Past Medical History  Diagnosis Date  . Overactive bladder   . Arthritis     osteoarthritis in knees and back  . Degeneration of lumbar intervertebral disc   . Hyperlipidemia     takes Pravastatin nightly  . PONV (postoperative nausea and vomiting)   . History of bronchitis     last time 38yrs ago  . Headache(784.0)     occasionally  . Weakness     and tingling in right foot  . Joint pain   . Joint swelling   . Chronic back pain     scoliosis and stenosis and herniated disc  . GERD (gastroesophageal reflux disease)     takes Nexium daily  . Diverticulosis   . Urinary urgency   . History of kidney stones   .  Hypothyroidism     takes SYnthroid daily  . Anxiety     takes Paxil daily    Past Surgical History  Procedure Laterality Date  . Carpal tunnel release Left     2010 and then right in 2012  . Abdominal hysterectomy    . Eye surgery    . Breast surgery  1980    augmentation  . Hand surgery      CMP joint  . Total thyroidectomy  1981  . Back surgery  2013    fusion  . Tonsillectomy  1980  . Joint replacement      both hands  . Colonoscopy    . Esophagogastroduodenoscopy      History reviewed. No pertinent family history. Social History:  reports that she has quit smoking. Her smoking use included Cigarettes. She has a 10 pack-year smoking history. She has never used smokeless tobacco. She reports that she does not drink alcohol or use illicit drugs.  Allergies:  Allergies  Allergen Reactions  . Keflex [Cephalexin] Other (See Comments)    Facial swelling and rash  . Morphine And Related Nausea And Vomiting    Medications Prior to Admission  Medication Sig Dispense Refill  . Biotin 10 MG TABS Take 10 mg by mouth daily.      . celecoxib (CELEBREX) 200 MG capsule Take 200-400 mg by mouth daily as needed (for pain).      Marland Kitchen  esomeprazole (NEXIUM) 20 MG capsule Take 20 mg by mouth daily at 12 noon.      Marland Kitchen. estradiol (ESTRACE) 0.5 MG tablet Take 0.5 mg by mouth daily.      Marland Kitchen. HYDROcodone-acetaminophen (NORCO/VICODIN) 5-325 MG per tablet Take 1 tablet by mouth every 6 (six) hours as needed for moderate pain.      Marland Kitchen. levothyroxine (SYNTHROID, LEVOTHROID) 88 MCG tablet Take 88 mcg by mouth daily.      . Multiple Vitamin (MULTIVITAMIN WITH MINERALS) TABS Take 1 tablet by mouth daily.      Marland Kitchen. PARoxetine (PAXIL) 20 MG tablet Take 20 mg by mouth every morning.      . pravastatin (PRAVACHOL) 20 MG tablet Take 20 mg by mouth daily.      Marland Kitchen. acyclovir (ZOVIRAX) 400 MG tablet Take 400 mg by mouth 2 (two) times daily as needed. For cold sore flare ups        No results found for this or any  previous visit (from the past 48 hour(s)). No results found.  Review of Systems  Constitutional: Negative.   HENT: Negative.   Eyes: Negative.   Respiratory: Negative.   Cardiovascular: Negative.   Gastrointestinal: Negative.   Genitourinary: Negative.   Musculoskeletal: Positive for back pain, joint pain and myalgias.  Skin: Negative.   Neurological: Positive for tingling.  Endo/Heme/Allergies: Negative.     Blood pressure 160/78, pulse 74, temperature 97.9 F (36.6 C), temperature source Oral, resp. rate 16, SpO2 98.00%. Physical Exam  Constitutional: She is oriented to person, place, and time. She appears well-developed and well-nourished.  HENT:  Head: Normocephalic.  Eyes: Pupils are equal, round, and reactive to light.  Neck: Normal range of motion.  Respiratory: Effort normal.  GI: Soft.  Neurological: She is alert and oriented to person, place, and time. GCS eye subscore is 4. GCS verbal subscore is 5. GCS motor subscore is 6.  Strength is 5 out of 5 in her iliopsoas, quads, hip she's, gastrocs, it tibialis, and EHL.  Skin: Skin is warm and dry.     Assessment/Plan 68 year old female presents for an interbody fusion L5-S1 with removal of hardware and revision of hardware L3-S1  Shannon Gutierrez P 10/03/2013, 7:51 AM

## 2013-10-03 NOTE — Progress Notes (Signed)
ANTIBIOTIC CONSULT NOTE - INITIAL  Pharmacy Consult for vancomycin Indication: post-op prophylaxis  Allergies  Allergen Reactions  . Keflex [Cephalexin] Other (See Comments)    Facial swelling and rash  . Morphine And Related Nausea And Vomiting    Patient Measurements: Height: 5\' 3"  (160 cm) Weight: 171 lb 1.6 oz (77.61 kg) IBW/kg (Calculated) : 52.4  Vital Signs: Temp: 97 F (36.1 C) (04/01 1530) Temp src: Oral (04/01 0632) BP: 136/48 mmHg (04/01 1445) Pulse Rate: 88 (04/01 1530) Intake/Output from previous day:   Intake/Output from this shift: Total I/O In: 2000 [I.V.:2000] Out: 685 [Urine:335; Drains:100; Blood:250]  Labs: No results found for this basename: WBC, HGB, PLT, LABCREA, CREATININE,  in the last 72 hours Estimated Creatinine Clearance: 66.4 ml/min (by C-G formula based on Cr of 0.71). No results found for this basename: VANCOTROUGH, Leodis Binet, VANCORANDOM, GENTTROUGH, GENTPEAK, GENTRANDOM, TOBRATROUGH, TOBRAPEAK, TOBRARND, AMIKACINPEAK, AMIKACINTROU, AMIKACIN,  in the last 72 hours   Microbiology: Recent Results (from the past 720 hour(s))  SURGICAL PCR SCREEN     Status: None   Collection Time    09/24/13 10:20 AM      Result Value Ref Range Status   MRSA, PCR NEGATIVE  NEGATIVE Final   Staphylococcus aureus NEGATIVE  NEGATIVE Final   Comment:            The Xpert SA Assay (FDA     approved for NASAL specimens     in patients over 61 years of age),     is one component of     a comprehensive surveillance     program.  Test performance has     been validated by [employer] for patients greater     than or equal to 88 year old.     It is not intended     to diagnose infection nor to     guide or monitor treatment.    Medical History: Past Medical History  Diagnosis Date  . Overactive bladder   . Arthritis     osteoarthritis in knees and back  . Degeneration of lumbar intervertebral disc   . Hyperlipidemia     takes Pravastatin  nightly  . PONV (postoperative nausea and vomiting)   . History of bronchitis     last time 46yrs ago  . Headache(784.0)     occasionally  . Weakness     and tingling in right foot  . Joint pain   . Joint swelling   . Chronic back pain     scoliosis and stenosis and herniated disc  . GERD (gastroesophageal reflux disease)     takes Nexium daily  . Diverticulosis   . Urinary urgency   . History of kidney stones   . Hypothyroidism     takes SYnthroid daily  . Anxiety     takes Paxil daily    Medications:  Prescriptions prior to admission  Medication Sig Dispense Refill  . Biotin 10 MG TABS Take 10 mg by mouth daily.      . celecoxib (CELEBREX) 200 MG capsule Take 200-400 mg by mouth daily as needed (for pain).      Marland Kitchen esomeprazole (NEXIUM) 20 MG capsule Take 20 mg by mouth daily at 12 noon.      Marland Kitchen estradiol (ESTRACE) 0.5 MG tablet Take 0.5 mg by mouth daily.      Marland Kitchen HYDROcodone-acetaminophen (NORCO/VICODIN) 5-325 MG per tablet Take 1 tablet by mouth every 6 (six) hours as  needed for moderate pain.      Marland Kitchen. levothyroxine (SYNTHROID, LEVOTHROID) 88 MCG tablet Take 88 mcg by mouth daily.      . Multiple Vitamin (MULTIVITAMIN WITH MINERALS) TABS Take 1 tablet by mouth daily.      Marland Kitchen. PARoxetine (PAXIL) 20 MG tablet Take 20 mg by mouth every morning.      . pravastatin (PRAVACHOL) 20 MG tablet Take 20 mg by mouth daily.      Marland Kitchen. acyclovir (ZOVIRAX) 400 MG tablet Take 400 mg by mouth 2 (two) times daily as needed. For cold sore flare ups       Assessment: 68 y/o female to begin vancomycin for post-op prophylaxis s/p lumbar fusion with drain placement. Pre-op vancomycin 1000 mg IV given at 08:20. Renal function is normal.   Goal of Therapy:  Vancomycin trough level 10-15 mcg/ml  Plan:  - Vancomycin 750 mg IV q12h - Monitor renal function, clinical course, and LOT  Raulerson HospitalJennifer Ty Ty, 17[address] Rainbow BoulevardPharm.D., BCPS Clinical Pharmacist Pager: [phone] 10/03/2013 4:36 PM

## 2013-10-03 NOTE — Anesthesia Postprocedure Evaluation (Signed)
  Anesthesia Post-op Note  Patient: Shannon Gutierrez  Procedure(s) Performed: Procedure(s) with comments: Lumbar Five to Sacral One Posterior Lumbar Interbody Fusion with interbody prothesis posterior lateral arthrodesis and posterior nonsegmental instrumentation (N/A) - POSTERIOR LUMBAR FUSION 1 LEVEL  Patient Location: PACU  Anesthesia Type:General  Level of Consciousness: awake  Airway and Oxygen Therapy: Patient Spontanous Breathing  Post-op Pain: mild  Post-op Assessment: Post-op Vital signs reviewed  Post-op Vital Signs: Reviewed  Complications: No apparent anesthesia complications

## 2013-10-03 NOTE — Anesthesia Preprocedure Evaluation (Addendum)
Anesthesia Evaluation  Patient identified by MRN, date of birth, ID band Patient awake    Reviewed: Allergy & Precautions, H&P , NPO status , Patient's Chart, lab work & pertinent test results  History of Anesthesia Complications (+) PONVNegative for: history of anesthetic complications  Airway Mallampati: I  Neck ROM: Full    Dental  (+) Caps, Dental Advisory Given   Pulmonary former smoker,  Quit smoking 35 years ago breath sounds clear to auscultation  Pulmonary exam normal       Cardiovascular negative cardio ROS  Rhythm:Regular     Neuro/Psych  Headaches, Occasional headaches not migraines.  Pain behind Rt leg without numbness but has some weakness    GI/Hepatic Neg liver ROS, GERD-  Medicated and Controlled,  Endo/Other  Hypothyroidism   Renal/GU negative Renal ROS     Musculoskeletal   Abdominal Normal abdominal exam  (+)   Peds  Hematology   Anesthesia Other Findings   Reproductive/Obstetrics                        Anesthesia Physical Anesthesia Plan  ASA: II  Anesthesia Plan: General   Post-op Pain Management:    Induction: Intravenous  Airway Management Planned: Oral ETT and Mask  Additional Equipment:   Intra-op Plan:   Post-operative Plan: Extubation in OR  Informed Consent: I have reviewed the patients History and Physical, chart, labs and discussed the procedure including the risks, benefits and alternatives for the proposed anesthesia with the patient or authorized representative who has indicated his/her understanding and acceptance.   Dental advisory given  Plan Discussed with: CRNA, Anesthesiologist and Surgeon  Anesthesia Plan Comments:         Anesthesia Quick Evaluation

## 2013-10-03 NOTE — Progress Notes (Signed)
pts eye hurting, Dr Wynetta Emeryram in to see pt and eye drops ordered and given

## 2013-10-03 NOTE — Anesthesia Procedure Notes (Signed)
Procedure Name: Intubation Date/Time: 10/03/2013 8:31 AM Performed by: Coralee RudFLORES, Julisa Flippo Pre-anesthesia Checklist: Patient identified, Emergency Drugs available, Suction available and Patient being monitored Patient Re-evaluated:Patient Re-evaluated prior to inductionOxygen Delivery Method: Circle system utilized Preoxygenation: Pre-oxygenation with 100% oxygen Intubation Type: IV induction Ventilation: Mask ventilation without difficulty Laryngoscope Size: Miller and 3 Grade View: Grade II Tube type: Oral Tube size: 7.5 mm Number of attempts: 1 Airway Equipment and Method: Stylet Placement Confirmation: ETT inserted through vocal cords under direct vision,  positive ETCO2 and breath sounds checked- equal and bilateral Secured at: 21 cm Tube secured with: Tape Dental Injury: Teeth and Oropharynx as per pre-operative assessment

## 2013-10-03 NOTE — Op Note (Signed)
Preoperative diagnosis: Degenerative disc disease lumbar spinal stenosis and severe foraminal stenosis L5-S1 with right-sided L5 and S1 radiculopathies  Postoperative diagnosis: Same  Procedure: Decompressive lumbar laminectomy and access and requiring more work than would be needed with a standard interbody fusion L5-S1 with complete facetectomies radical foraminotomies  #2 reexploration of fusion removal of hardware L3-L5 with removal of right L5 pedicle screw  #3 posterior lumbar interbody fusion L5-S1 using the globus arise expandable peek cages packed with local autograft mixed with Kinex and BMP  #4 pedicle screw fixation L3-S1 using the globus Revere pedicle screw system from L3-L5 and a Creo pedicle screws at S1 all 5.5  #5 posterior lateral arthrodesis L4-S1 using locally harvested autograft mixed with Kinex and BMP  #6 placement of a large Hemovac drain  Surgeon: Jillyn HiddenGary Eisley Barber  Assistant: Tressie StalkerJeffrey Jenkins  Anesthesia: Gen.  EBL: Minimal  History of present illness: Patient is a very pleasant 68 year old female who previously undergone an L3-L5 fusion following a work-related accident. Patient initially did very well however start splinting progressive worsening back and right leg pain. Workup revealed severe degenerative collapse the L5-S1 disc space with severe foraminal stenosis the L5-S1 nerve roots. CT scan showed solid fusions at L3-L5 however the right L5 pedicle screw didn't pierce the anterior cortex and therefore I felt it needed to be removed and or replaced. I extensively the risks and benefits of a posterior lumbar interbody fusion L5-S1 and removal replacement of her right L5 pedicle screw with patient she understood and agreed to proceed forward.  Operative procedure: Patient brought into the or was induced under general anesthesia positioned prone the Wilson frame her back was prepped and draped in routine sterile fashion. Her old incision was opened up and extended  caudally and subperiosteal dissections care lamina of L5 and S1 the hardware was identified and explored up to L3 cross-link was then identified and the cross-link was removed the nuts remove the rods removed and the fusion was inspected. The fusion was felt to be solid from L3-L5 at this point the right L5 pedicle screw was removed the right CT was dissected out the spinous process and laminar complexes L5 was removed it did facet joints were drilled down aggressive sent to decompress limits was carried out a radical medial facetectomies and radical foraminotomies of both the L5 and S1 nerve roots were carried out. Aggressive abutting the superior tickling facet a ladder axis the lateral margins disc space. There was marked stenosis in the L5 nerve root due to a marked facet arthropathy was causing severe foraminal stenosis as well as hourglass compression of thecal sac. After adequate exposure been achieved the S1 pedicle screws were placed using a high-speed drill and fluoroscopy pilot holes were drilled cannulated probed O55 tap probed and a 6 5 x 35 screw inserted bilaterally using the Creo-5.5 modular pedicle screw set. I can't replace the right L5 screw however I could not obtain a lateral enough and 3 point without breaking off the TPN door breaking into the old pedicle hole so therefore I elected to leave the pedicle screw out. Tensions and taken to the interbody work (cut the disc was incised bilaterally cleanout pituitary rongeurs using sequential distraction from a 6-7 up to a 9 distracted the disc space opened which helped open the L5-S1 foramina. With the distractor in place to clean up the space and the patient's left side the endplates were scraped using rotating cutters Epstein curettes. A 8 mm expandable arise peek cages packed  with local autograft mixed with Kinex and placed on the left side expanded to open up the vertebral bodies. Then with the distractor removed the spaces further cleaned down  a lot of central disc was removed as was lateral disc decompress the lateral foramina. After adequate endplate been achieved on the contralateral side local autograft and BMP was packed into the interspace and against the contralateral cage. Then the his lateral cages in place expanded a similar fashion. After only a by [employer] posterior fluoroscopy confirmed good position of all the implants of the foraminal reinspected confirm patency. Then the wound scope was irrigated meticulous hemostasis was maintained aggressive decortication was care MTPs from L4 down to S1 and along the sacral ala. Local autograft BMP and Kinex was packed MTPs at L4 down the sacral ala. Rods were then fashioned and inserted after the head were assembled on the preoperative screws and the screws were danced more. All the knots were applied top tightening screws were tightened down there was a mild amount of compression I carried out at the L5-S1 level. After all interbody work been done and after all the rod assembly been assembled fluoroscopy confirmed good position of the implants then the foraminal reinspected confirm patency Gelfoam was laid up the dura large abductor was placed across it was applied and the wounds closed with after Vicryl the skin was closed running 4 subcuticular benzo and Steri-Strips were applied patient recovered in stable condition. At the end of case on it counts sponge counts were correct.

## 2013-10-03 NOTE — Transfer of Care (Signed)
Immediate Anesthesia Transfer of Care Note  Patient: Shannon Gutierrez  Procedure(s) Performed: Procedure(s) with comments: Lumbar Five to Sacral One Posterior Lumbar Interbody Fusion with interbody prothesis posterior lateral arthrodesis and posterior nonsegmental instrumentation (N/A) - POSTERIOR LUMBAR FUSION 1 LEVEL  Patient Location: PACU  Anesthesia Type:General  Level of Consciousness: awake and sedated  Airway & Oxygen Therapy: Patient Spontanous Breathing and Patient connected to face mask oxygen  Post-op Assessment: Report given to PACU RN, Post -op Vital signs reviewed and stable and Patient moving all extremities  Post vital signs: Reviewed and stable  Complications: No apparent anesthesia complications

## 2013-10-04 NOTE — Evaluation (Signed)
Physical Therapy Evaluation Patient Details Name: Shannon BarbaraBarbara T Ethington MRN: [id number] DOB: 02/17/1946 Today's Date: 10/04/2013   History of Present Illness  68 yo female s/p fusion L5-S1, revision of hardware L3-S1.   Clinical Impression  On eval, pt required Min assist for mobility-able to ambulate ~150 feet with walker. Reviewed back precautions and logroll technique. Pt tolerated session well.     Follow Up Recommendations Home health PT;Supervision - Intermittent (to ensure safe mobility and maximize independence in home environment)    Equipment Recommendations  None recommended by PT    Recommendations for Other Services OT consult     Precautions / Restrictions Precautions Precautions: Back Precaution Comments: Reviewed back precautions and logroll technique Required Braces or Orthoses: Spinal Brace Spinal Brace: Lumbar corset;Applied in sitting position. (No order noted for corset in chart. Pt remembered towards end of session that she is to wear brace when mobilizing. Pt able to don brace at end of session before working with OT) Restrictions Weight Bearing Restrictions: No      Mobility  Bed Mobility Overal bed mobility: Needs Assistance Bed Mobility: Rolling;Sidelying to Sit Rolling: Supervision Sidelying to sit: Min assist       General bed mobility comments: Small amount of assit for trunk to upright. VCs safety, technique.   Transfers Overall transfer level: Needs assistance Equipment used: Rolling walker (2 wheeled) Transfers: Sit to/from Stand Sit to Stand: Min guard         General transfer comment: close guard for safety  Ambulation/Gait Ambulation/Gait assistance: Min guard Ambulation Distance (Feet): 150 Feet Assistive device: Rolling walker (2 wheeled) Gait Pattern/deviations: Step-through pattern     General Gait Details: slow gait speed. Pt did well maintaining back precautions while mobilizing. Reported pain as 4/10.   Stairs             Wheelchair Mobility    Modified Rankin (Stroke Patients Only)       Balance                                             Pertinent Vitals/Pain 4/10 back at rest and with activity    Home Living Family/patient expects to be discharged to:: Private residence Living Arrangements: Alone   Type of Home: Other(Comment) (townhome) Home Access: Level entry     Home Layout: One level Home Equipment: Cane - quad;Adaptive equipment;Shower seat - built in;Shower seat      Prior Function                 Hand Dominance        Extremity/Trunk Assessment   Upper Extremity Assessment: Defer to OT evaluation           Lower Extremity Assessment: Overall WFL for tasks assessed      Cervical / Trunk Assessment: Normal  Communication   Communication: No difficulties  Cognition Arousal/Alertness: Awake/alert Behavior During Therapy: WFL for tasks assessed/performed Overall Cognitive Status: Within Functional Limits for tasks assessed                      General Comments      Exercises        Assessment/Plan    PT Assessment Patient needs continued PT services  PT Diagnosis Difficulty walking;Acute pain   PT Problem List Decreased mobility;Pain;Decreased knowledge of precautions;Decreased activity tolerance  PT Treatment Interventions DME  instruction;Gait training;Functional mobility training;Therapeutic activities;Therapeutic exercise;Patient/family education   PT Goals (Current goals can be found in the Care Plan section) Acute Rehab PT Goals Patient Stated Goal: home PT Goal Formulation: With patient/family Time For Goal Achievement: 10/18/13 Potential to Achieve Goals: Good    Frequency Min 5X/week   Barriers to discharge        Co-evaluation               End of Session   Activity Tolerance: Patient tolerated treatment well Patient left: in bed;with family/visitor present (sitting EOB with OT to take  over session)           Time: 0950-1006 PT Time Calculation (min): 16 min   Charges:   PT Evaluation $Initial PT Evaluation Tier I: 1 Procedure PT Treatments $Gait Training: 8-22 mins   PT G Codes:          Rebeca Alert, MPT Pager: [phone]

## 2013-10-04 NOTE — Progress Notes (Signed)
Subjective: Patient reports she's doing well no leg pain back is very sore to manage well medications.  Objective: Vital signs in last 24 hours: Temp:  [97 F (36.1 C)-98.2 F (36.8 C)] 97.9 F (36.6 C) (04/02 0537) Pulse Rate:  [74-113] 85 (04/02 0537) Resp:  [12-21] 16 (04/02 0537) BP: (119-160)/(40-78) 142/60 mmHg (04/02 0537) SpO2:  [90 %-100 %] 91 % (04/02 0537) Weight:  [77.61 kg (171 lb 1.6 oz)] 77.61 kg (171 lb 1.6 oz) (04/01 1530)  Intake/Output from previous day: 04/01 0701 - 04/02 0700 In: 2120 [P.O.:120; I.V.:2000] Out: 785 [Urine:335; Drains:200; Blood:250] Intake/Output this shift: Total I/O In: 120 [P.O.:120] Out: 100 [Drains:100]  awake alert oriented strength out of 5 wound clean and dry  Lab Results: No results found for this basename: WBC, HGB, HCT, PLT,  in the last 72 hours BMET No results found for this basename: NA, K, CL, CO2, GLUCOSE, BUN, CREATININE, CALCIUM,  in the last 72 hours  Studies/Results: Dg Lumbar Spine 2-3 Views  10/03/2013   CLINICAL DATA:  Lumbar stenosis.  Lumbar herniated disc.  Scoliosis.  EXAM: DG C-ARM 1-60 MIN; LUMBAR SPINE - 2-3 VIEW  COMPARISON:  DG OUTSIDE FILMS SPINE dated 10/02/2012; DG C-ARM 61-120 MIN dated 04/17/2012  FINDINGS: One lateral and 3 frontal intraoperative spot fluoroscopic images of the lower lumbar spine are provided. Prior L3-L5 posterior lumbar interbody fusion is again demonstrated. There has been interval performance of L5-S1 posterior and interbody fusion.  IMPRESSION: Intraoperative images during L5-S1 fusion.   Electronically Signed   By: Sebastian AcheAllen  Grady   On: 10/03/2013 13:37   Dg C-arm 1-60 Min  10/03/2013   CLINICAL DATA:  Lumbar stenosis.  Lumbar herniated disc.  Scoliosis.  EXAM: DG C-ARM 1-60 MIN; LUMBAR SPINE - 2-3 VIEW  COMPARISON:  DG OUTSIDE FILMS SPINE dated 10/02/2012; DG C-ARM 61-120 MIN dated 04/17/2012  FINDINGS: One lateral and 3 frontal intraoperative spot fluoroscopic images of the lower lumbar  spine are provided. Prior L3-L5 posterior lumbar interbody fusion is again demonstrated. There has been interval performance of L5-S1 posterior and interbody fusion.  IMPRESSION: Intraoperative images during L5-S1 fusion.   Electronically Signed   By: Sebastian AcheAllen  Grady   On: 10/03/2013 13:37    Assessment/Plan: Progressive mobilization with physical and occupational therapy provide an incentive spirometry. Discharge when ambulating and voiding and pain management managed on oral medications   LOS: 1 day     Devin Ganaway P 10/04/2013, 6:30 AM

## 2013-10-04 NOTE — Evaluation (Signed)
Occupational Therapy Evaluation Patient Details Name: SEMONE ORLOV MRN: [id number] DOB: 1945/08/30 Today's Date: 10/04/2013    History of Present Illness 68 yo female s/p fusion L5-S1, revision of hardware L3-S1.    Clinical Impression   Patient pleasant and cooperative, seen immediately after PT evaluation.  Patient able to ambulate with RW, needing assistance only to manage IV pole.  Patient independent with basic self care skills prior to surgery.  PAtient very aware of back precautions from previous surgery in 2013.  Patient has all necessary adaptive equipment, and has seat in walk in shower.  Patient demonstrated safe performance of self care tasks (bathing, dressing, toileting shower transfer) during this OT session, adhering to back precautions.  Son present for OT session this am. Patient in agreement that no further OT is warranted at this time.  Patient knows she will return to more strict back precautions as she did after back surgery before.      Follow Up Recommendations  No OT follow up    Equipment Recommendations  None recommended by OT    Recommendations for Other Services       Precautions / Restrictions Precautions Precautions: Back Precaution Comments: reviewed back precautions with ADL Required Braces or Orthoses: Spinal Brace Spinal Brace: Lumbar corset;Applied in sitting position Restrictions Weight Bearing Restrictions: No      Mobility Bed Mobility Overal bed mobility: Needs Assistance Bed Mobility: Rolling;Sidelying to Sit Rolling: Supervision Sidelying to sit: Min assist       General bed mobility comments: Small amount of assit for trunk to upright. VCs safety, technique.   Transfers Overall transfer level: Needs assistance (to manage IV pole) Equipment used: Rolling walker (2 wheeled) Transfers: Sit to/from [employer] Sit to Stand: Min guard Stand pivot transfers: Modified independent (Device/Increase time) (to manage IV  pole)       General transfer comment: wears lumbar corset when up    Balance Overall balance assessment: Modified Independent                                          ADL Overall ADL's : Modified independent                                       General ADL Comments: Very familiar with adaptive equipment and DME from previous surgery, CIR stay.  Safely performs basic self care tasks, and functional mobility adhering to back precautions     Vision                     Perception Perception Perception Tested?: No   Praxis Praxis Praxis tested?: Within functional limits    Pertinent Vitals/Pain VSS, Pain - back 4/10.  Patient pleased with this level.  Patient assisted to chair with pillow behind back for support, and legs elevated.       Hand Dominance Right   Extremity/Trunk Assessment Upper Extremity Assessment Upper Extremity Assessment: Overall WFL for tasks assessed   Lower Extremity Assessment Lower Extremity Assessment: Defer to PT evaluation   Cervical / Trunk Assessment Cervical / Trunk Assessment: Normal (considering back precautions)   Communication Communication Communication: No difficulties   Cognition Arousal/Alertness: Awake/alert Behavior During Therapy: WFL for tasks assessed/performed Overall Cognitive Status: Within Functional Limits for tasks assessed  General Comments       Exercises       Shoulder Instructions      Home Living Family/patient expects to be discharged to:: Private residence Living Arrangements: Alone   Type of Home: House (town house) Home Access: Level entry     Home Layout: One level     Bathroom Shower/Tub: Walk-in Pensions consultantshower;Curtain   Bathroom Toilet: Standard Bathroom Accessibility: Yes How Accessible: Accessible via walker Home Equipment: Shower seat - built Designer, fashion/clothingin;Adaptive equipment Adaptive Equipment: Reacher;Sock aid;Long-handled shoe  horn;Long-handled sponge        Prior Functioning/Environment Level of Independence: Independent with assistive device(s)             OT Diagnosis:     OT Problem List:     OT Treatment/Interventions:      OT Goals(Current goals can be found in the care plan section) Acute Rehab OT Goals Patient Stated Goal: home  OT Frequency:     Barriers to D/C:            Co-evaluation              End of Session Equipment Utilized During Treatment: Rolling walker;Back brace Nurse Communication: Mobility status  Activity Tolerance: Patient tolerated treatment well Patient left: in chair;with call bell/phone within reach;with family/visitor present   Time: 1005-1029 OT Time Calculation (min): 24 min Charges:    G-Codes:    Collier SalinaGellert, Elliett Guarisco M 10/04/2013, 10:39 AM

## 2013-10-04 NOTE — Progress Notes (Signed)
UR complete.  Anaily Ashbaugh RN, MSN 

## 2013-10-05 MED FILL — Sodium Chloride IV Soln 0.9%: INTRAVENOUS | Qty: 1000 | Status: AC

## 2013-10-05 MED FILL — Heparin Sodium (Porcine) Inj 1000 Unit/ML: INTRAMUSCULAR | Qty: 30 | Status: AC

## 2013-10-05 NOTE — Progress Notes (Signed)
Physical Therapy Treatment Patient Details Name: Shannon Gutierrez MRN: [id number] DOB: 03-Apr-1946 Today's Date: 10/05/2013    History of Present Illness 68 yo female s/p fusion L5-S1, revision of hardware L3-S1.     PT Comments    Patient motivated for therapy today. Continue to recommend HHPT with supervision for safety.    Follow Up Recommendations  Home health PT;Supervision - Intermittent     Equipment Recommendations  None recommended by PT    Recommendations for Other Services OT consult     Precautions / Restrictions Precautions Precautions: Back Required Braces or Orthoses: Spinal Brace Spinal Brace: Lumbar corset;Applied in sitting position Restrictions Weight Bearing Restrictions: No    Mobility  Bed Mobility Overal bed mobility: Modified Independent Bed Mobility: Rolling;Sidelying to Sit Rolling: Supervision Sidelying to sit: Supervision       General bed mobility comments: Patient able to perform bed mobility with limited cues for safety with hemovac  Transfers Overall transfer level: Needs assistance Equipment used: Rolling walker (2 wheeled) Transfers: Sit to/from Stand Sit to Stand: Min guard         General transfer comment: wears lumbar corset when up. Pateint required cues for hand placement. A needed with hemovac  Ambulation/Gait Ambulation/Gait assistance: Min guard Ambulation Distance (Feet): 300 Feet Assistive device: Rolling walker (2 wheeled) Gait Pattern/deviations: Step-through pattern;Decreased stride length     General Gait Details: slow gait speed. Pt did well maintaining back precautions while mobilizing. Patient able to manuever RW in hall safetly while communicating and observing external distractions   Stairs            Wheelchair Mobility    Modified Rankin (Stroke Patients Only)       Balance Overall balance assessment: Modified Independent                                  Cognition  Arousal/Alertness: Awake/alert Behavior During Therapy: WFL for tasks assessed/performed Overall Cognitive Status: Within Functional Limits for tasks assessed                      Exercises      General Comments General comments (skin integrity, edema, etc.): Patient able to perform ADLs at counter. Required cues for upright posture.      Pertinent Vitals/Pain Patient reported 4/10 pain pre treatment and 5/10 post treatment in LB.     Home Living                      Prior Function            PT Goals (current goals can now be found in the care plan section) Acute Rehab PT Goals Patient Stated Goal: home PT Goal Formulation: With patient/family Time For Goal Achievement: 10/18/13 Potential to Achieve Goals: Good Progress towards PT goals: Progressing toward goals    Frequency  Min 5X/week    PT Plan Current plan remains appropriate    Co-evaluation             End of Session Equipment Utilized During Treatment: Gait belt Activity Tolerance: Patient tolerated treatment well Patient left: in chair;with call bell/phone within reach     Time: 0805-0836 PT Time Calculation (min): 31 min  Charges:                       G Codes:  Nasir Bright, SPTA 10/05/2013, 8:43 AM

## 2013-10-05 NOTE — Plan of Care (Signed)
Pt iv site was swollen and reddened. Iv removed. Pt does not want new iv.

## 2013-10-05 NOTE — Plan of Care (Signed)
Dr cabbell/ dr Ollen Bowlharkins paged in regards to no iv site and if vanco iv can be d/c and switched to new medication. Pt does not want new iv to be restarted.

## 2013-10-05 NOTE — Progress Notes (Signed)
Seen and agreed 10/05/2013 Suleyma Wafer Elizabeth PTA [phone] pager [phone] office    

## 2013-10-05 NOTE — Progress Notes (Signed)
Patient ID: Shannon Gutierrez, female   DOB: June 06, 1946, 68 y.o.   MRN: [id number] BP 134/54  Pulse 94  Temp(Src) 99 F (37.2 C) (Oral)  Resp 20  Ht 5\' 3"  (1.6 m)  Wt 77.61 kg (171 lb 1.6 oz)  BMI 30.32 kg/m2  SpO2 98% Alert and oriented x 4 Wound is clean, dry, without signs of infection Drain removed today. Still with significant pain, not ready for discharge today.  Continue physical therapy.

## 2013-10-06 LAB — BASIC METABOLIC PANEL
BUN: 7 mg/dL (ref 6–23)
CALCIUM: 8.6 mg/dL (ref 8.4–10.5)
CO2: 22 meq/L (ref 19–32)
Chloride: 97 mEq/L (ref 96–112)
Creatinine, Ser: 0.56 mg/dL (ref 0.50–1.10)
GFR calc Af Amer: >90 mL/min (ref >90)
GFR calc non Af Amer: >90 mL/min (ref >90)
GLUCOSE: 96 mg/dL (ref 70–99)
Potassium: 3.7 mEq/L (ref 3.7–5.3)
Sodium: 135 mEq/L — ABNORMAL LOW (ref 137–147)

## 2013-10-06 MED ORDER — OXYCODONE-ACETAMINOPHEN 5-325 MG PO TABS: 1.0000 | ORAL_TABLET | ORAL | Status: DC | PRN

## 2013-10-06 MED ORDER — CYCLOBENZAPRINE HCL 10 MG PO TABS: 10.0000 mg | ORAL_TABLET | Freq: Three times a day (TID) | ORAL | Status: DC | PRN

## 2013-10-06 NOTE — Progress Notes (Signed)
Seen and agreed 10/06/2013 Robinette, Julia Elizabeth PTA [phone] pager [phone] office    

## 2013-10-06 NOTE — Discharge Summary (Signed)
Physician Discharge Summary  Patient ID: Shannon Gutierrez MRN: [id number] DOB/AGE: 69-Jul-1947 69 y.o.  Admit date: 10/03/2013 Discharge date: 10/06/2013  Admission Diagnoses: Lumbar spondylosis and stenosis L3-4 L4-5 L5-S1  Discharge Diagnoses: Lumbar spondylosis and stenosis L3-4 L4-5 L5-S1 with neurogenic claudication Active Problems:   Spinal stenosis of lumbar region   Discharged Condition: good  Hospital Course: Patient was admitted to undergo surgical decompression and arthrodesis from L3-4 to L5-S1. She tolerated the procedure well. She is ambulatory. Her incision is clean and dry.  Consults: None  Significant Diagnostic Studies: None  Treatments: surgery: Decompression L3-4 L4-5 L5-S1 with posterior interbody arthrodesis. Segmental fixation L3-S1.  Discharge Exam: Blood pressure 124/50, pulse 96, temperature 99.6 F (37.6 C), temperature source Oral, resp. rate 18, height 5\' 3"  (1.6 m), weight 77.61 kg (171 lb 1.6 oz), SpO2 97.00%. Motor function is intact. Incision is clean and dry.  Disposition: 01-Home or Self Care  Discharge Orders   Future Orders Complete By Expires   Call MD for:  redness, tenderness, or signs of infection (pain, swelling, redness, odor or green/yellow discharge around incision site)  As directed    Call MD for:  severe uncontrolled pain  As directed    Call MD for:  temperature >100.4  As directed    Diet - low sodium heart healthy  As directed    Increase activity slowly  As directed        Medication List         acyclovir 400 MG tablet  Commonly known as:  ZOVIRAX  Take 400 mg by mouth 2 (two) times daily as needed. For cold sore flare ups     Biotin 10 MG Tabs  Take 10 mg by mouth daily.     celecoxib 200 MG capsule  Commonly known as:  CELEBREX  Take 200-400 mg by mouth daily as needed (for pain).     cyclobenzaprine 10 MG tablet  Commonly known as:  FLEXERIL  Take 1 tablet (10 mg total) by mouth 3 (three) times daily as needed  for muscle spasms.     esomeprazole 20 MG capsule  Commonly known as:  NEXIUM  Take 20 mg by mouth daily at 12 noon.     estradiol 0.5 MG tablet  Commonly known as:  ESTRACE  Take 0.5 mg by mouth daily.     HYDROcodone-acetaminophen 5-325 MG per tablet  Commonly known as:  NORCO/VICODIN  Take 1 tablet by mouth every 6 (six) hours as needed for moderate pain.     levothyroxine 88 MCG tablet  Commonly known as:  SYNTHROID, LEVOTHROID  Take 88 mcg by mouth daily.     multivitamin with minerals Tabs tablet  Take 1 tablet by mouth daily.     oxyCODONE-acetaminophen 5-325 MG per tablet  Commonly known as:  PERCOCET/ROXICET  Take 1-2 tablets by mouth every 4 (four) hours as needed for moderate pain.     PARoxetine 20 MG tablet  Commonly known as:  PAXIL  Take 20 mg by mouth every morning.     pravastatin 20 MG tablet  Commonly known as:  PRAVACHOL  Take 20 mg by mouth daily.         SignedStefani Dama: Shannon Gutierrez 10/06/2013, 11:59 AM

## 2013-10-06 NOTE — Progress Notes (Signed)
Physical Therapy Treatment Patient Details Name: Shannon Gutierrez MRN: [id number] DOB: 1946/05/24 Today's Date: 10/06/2013    History of Present Illness 68 yo female s/p fusion L5-S1, revision of hardware L3-S1.     PT Comments    Patient tolerated treatment well. Patient is motivated to return home. Anticipate D/C soon according to MD orders. Continue to encourage ambulation with staff.   Follow Up Recommendations  Home health PT;Supervision - Intermittent     Equipment Recommendations  None recommended by PT    Recommendations for Other Services OT consult     Precautions / Restrictions Precautions Precautions: Back Precaution Comments: reviewed back precautions with ADL Required Braces or Orthoses: Spinal Brace Spinal Brace: Lumbar corset;Applied in sitting position Restrictions Weight Bearing Restrictions: No    Mobility  Bed Mobility Overal bed mobility: Modified Independent Bed Mobility: Rolling;Sidelying to Sit              Transfers Overall transfer level: Needs assistance Equipment used: Rolling walker (2 wheeled) Transfers: Sit to/from Stand Sit to Stand: Min guard         General transfer comment: wears lumbar corset when up. Pateint required cues for hand placement. Min guard with standing as patient fatigues   Ambulation/Gait Ambulation/Gait assistance: Supervision Ambulation Distance (Feet): 500 Feet Assistive device: Rolling walker (2 wheeled) Gait Pattern/deviations: Step-through pattern;Decreased stride length     General Gait Details: Patient able to manuever hall with obstacles without cues   Stairs            Wheelchair Mobility    Modified Rankin (Stroke Patients Only)       Balance Overall balance assessment: Modified Independent                                  Cognition Arousal/Alertness: Awake/alert Behavior During Therapy: WFL for tasks assessed/performed Overall Cognitive Status: Within  Functional Limits for tasks assessed                      Exercises      General Comments        Pertinent Vitals/Pain Patient c/o R posterior thigh pain 7/10 pretreatment and reports 4/10 post gait training.     Home Living                      Prior Function            PT Goals (current goals can now be found in the care plan section) Acute Rehab PT Goals Patient Stated Goal: home PT Goal Formulation: With patient/family Time For Goal Achievement: 10/18/13 Potential to Achieve Goals: Good Progress towards PT goals: Progressing toward goals    Frequency  Min 5X/week    PT Plan Current plan remains appropriate    Co-evaluation             End of Session Equipment Utilized During Treatment: Gait belt Activity Tolerance: Patient tolerated treatment well Patient left: in bed;with call bell/phone within reach;with bed alarm set     Time: 0802-0825 PT Time Calculation (min): 23 min  Charges:                       G Codes:      Otilia Kareem, SPTA 10/06/2013, 8:32 AM

## 2013-11-02 DEATH — deceased

## 2014-06-10 ENCOUNTER — Other Ambulatory Visit (HOSPITAL_BASED_OUTPATIENT_CLINIC_OR_DEPARTMENT_OTHER): Payer: Self-pay | Admitting: Neurosurgery

## 2014-06-14 ENCOUNTER — Other Ambulatory Visit (HOSPITAL_BASED_OUTPATIENT_CLINIC_OR_DEPARTMENT_OTHER): Payer: Self-pay | Admitting: Neurosurgery

## 2014-06-14 DIAGNOSIS — M5136 Other intervertebral disc degeneration, lumbar region: Secondary | ICD-10-CM

## 2014-08-30 ENCOUNTER — Ambulatory Visit (HOSPITAL_BASED_OUTPATIENT_CLINIC_OR_DEPARTMENT_OTHER)
Admission: RE | Admit: 2014-08-30 | Discharge: 2014-08-30 | Disposition: A | Discharge status: Home / Self Care | Payer: Medicare Other | Source: Ambulatory Visit | Attending: Neurosurgery | Admitting: Neurosurgery

## 2014-08-30 DIAGNOSIS — M4806 Spinal stenosis, lumbar region: Secondary | ICD-10-CM | POA: Diagnosis not present

## 2014-08-30 DIAGNOSIS — Y838 Other surgical procedures as the cause of abnormal reaction of the patient, or of later complication, without mention of misadventure at the time of the procedure: Secondary | ICD-10-CM | POA: Insufficient documentation

## 2014-08-30 DIAGNOSIS — M5136 Other intervertebral disc degeneration, lumbar region: Secondary | ICD-10-CM | POA: Diagnosis present

## 2014-08-30 DIAGNOSIS — M960 Pseudarthrosis after fusion or arthrodesis: Secondary | ICD-10-CM | POA: Insufficient documentation

## 2014-09-03 ENCOUNTER — Ambulatory Visit (HOSPITAL_BASED_OUTPATIENT_CLINIC_OR_DEPARTMENT_OTHER): Payer: Worker's Compensation

## 2015-04-19 IMAGING — RF DG LUMBAR SPINE 2-3V
1 series · 4 of 4 positions shown · non-contrast
Comparison: DG OUTSIDE FILMS SPINE dated 10/02/2012; DG C-ARM 61-120
MIN dated 04/17/2012

CLINICAL DATA: Lumbar stenosis.  Lumbar herniated disc.  Scoliosis.

EXAM:
DG C-ARM 1-60 MIN; LUMBAR SPINE - 2-3 VIEW

[Series 1: run · 4 of 4 slices shown]
[im 1/4]
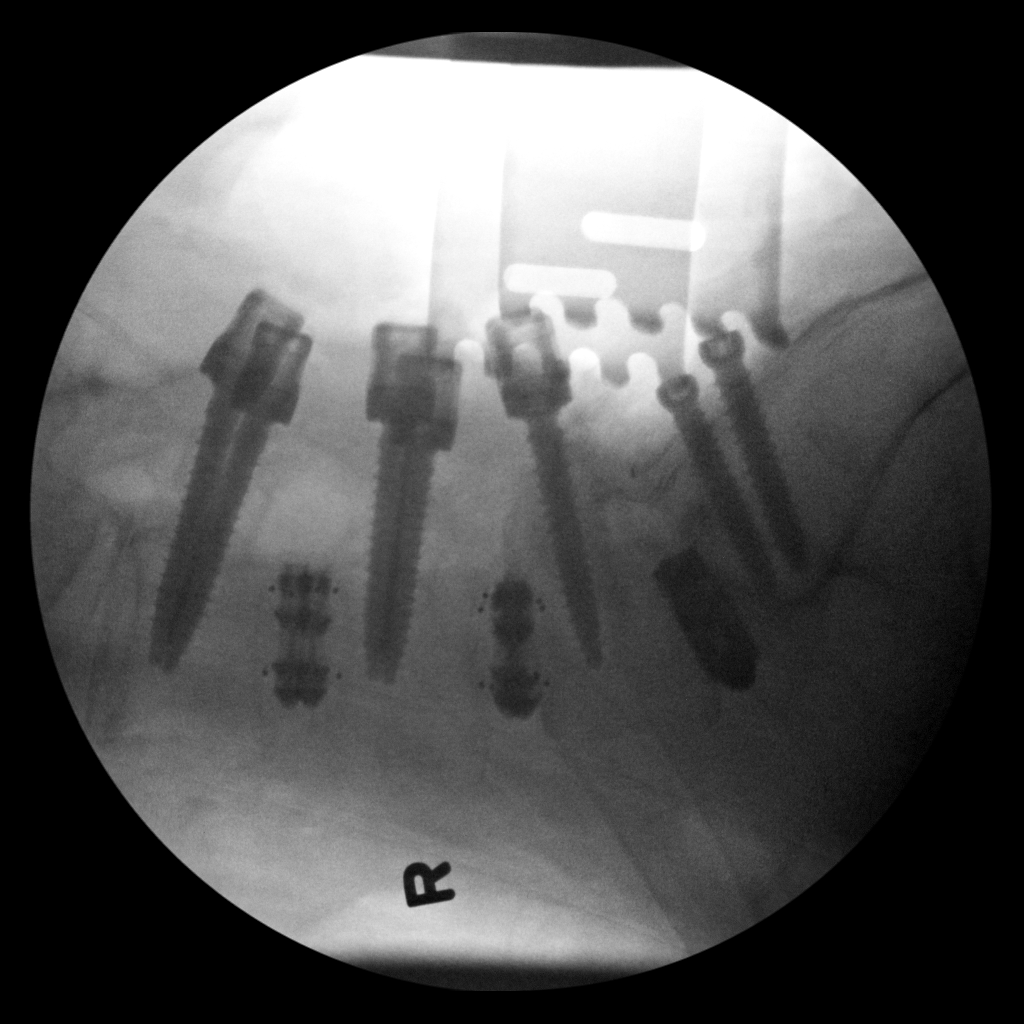
[im 2/4]
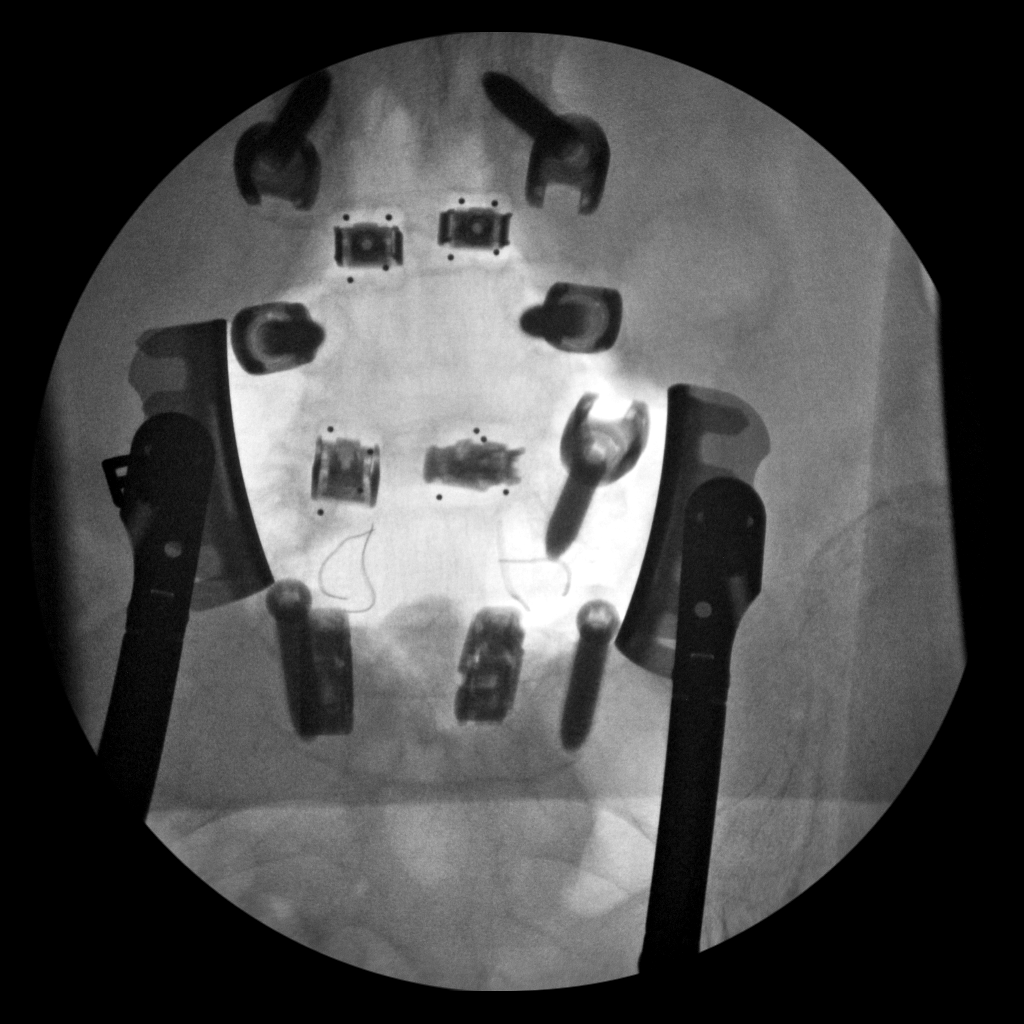
[im 3/4]
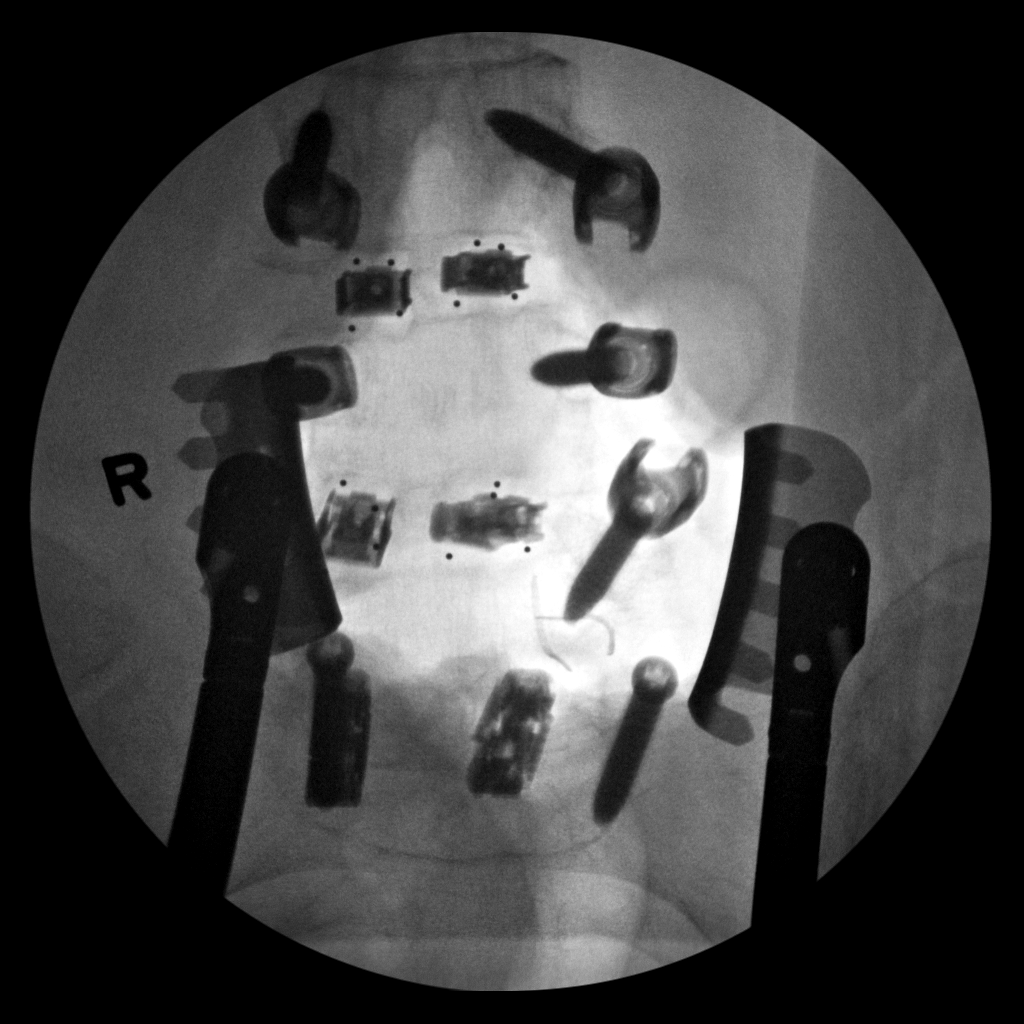
[im 4/4]
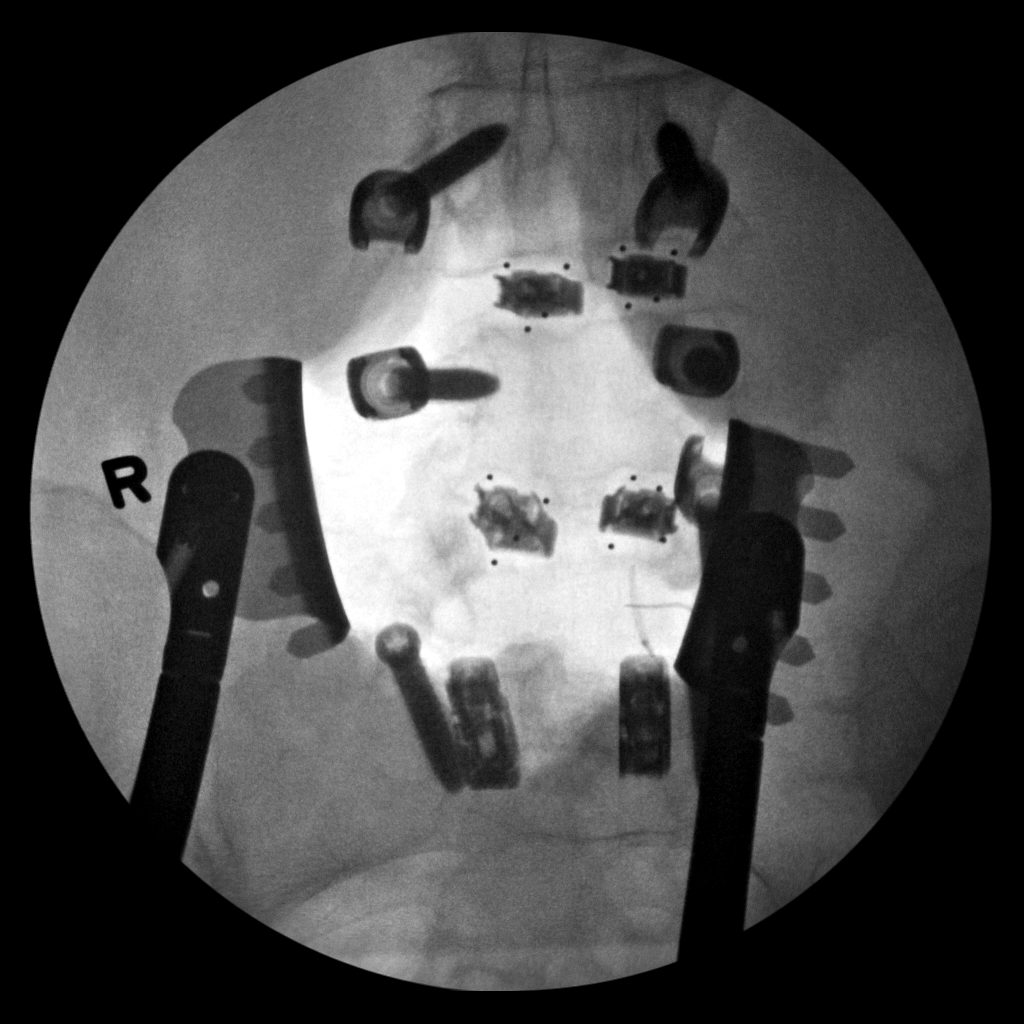

[4 of 4 positions shown; findings below may reference images not displayed]

FINDINGS: One lateral and 3 frontal intraoperative spot fluoroscopic images of
the lower lumbar spine are provided. Prior L3-L5 posterior lumbar
interbody fusion is again demonstrated. There has been interval
performance of L5-S1 posterior and interbody fusion.
IMPRESSION: Intraoperative images during L5-S1 fusion.

## 2015-07-30 DIAGNOSIS — M170 Bilateral primary osteoarthritis of knee: Secondary | ICD-10-CM | POA: Diagnosis not present

## 2015-08-11 DIAGNOSIS — M25561 Pain in right knee: Secondary | ICD-10-CM | POA: Diagnosis not present

## 2015-08-11 DIAGNOSIS — M6281 Muscle weakness (generalized): Secondary | ICD-10-CM | POA: Diagnosis not present

## 2015-08-11 DIAGNOSIS — M25562 Pain in left knee: Secondary | ICD-10-CM | POA: Diagnosis not present

## 2015-08-11 DIAGNOSIS — M25669 Stiffness of unspecified knee, not elsewhere classified: Secondary | ICD-10-CM | POA: Diagnosis not present

## 2015-08-14 DIAGNOSIS — M1712 Unilateral primary osteoarthritis, left knee: Secondary | ICD-10-CM | POA: Diagnosis not present

## 2015-09-04 DIAGNOSIS — M1712 Unilateral primary osteoarthritis, left knee: Secondary | ICD-10-CM | POA: Diagnosis not present

## 2015-09-04 DIAGNOSIS — Z6829 Body mass index (BMI) 29.0-29.9, adult: Secondary | ICD-10-CM | POA: Diagnosis not present

## 2015-10-06 DIAGNOSIS — Z01810 Encounter for preprocedural cardiovascular examination: Secondary | ICD-10-CM | POA: Diagnosis not present

## 2015-10-06 DIAGNOSIS — M1712 Unilateral primary osteoarthritis, left knee: Secondary | ICD-10-CM | POA: Diagnosis not present

## 2015-10-06 DIAGNOSIS — Z13820 Encounter for screening for osteoporosis: Secondary | ICD-10-CM | POA: Diagnosis not present

## 2015-10-31 DIAGNOSIS — M1712 Unilateral primary osteoarthritis, left knee: Secondary | ICD-10-CM | POA: Diagnosis not present

## 2015-11-12 DIAGNOSIS — M1712 Unilateral primary osteoarthritis, left knee: Secondary | ICD-10-CM | POA: Diagnosis not present

## 2015-11-12 DIAGNOSIS — Z90710 Acquired absence of both cervix and uterus: Secondary | ICD-10-CM | POA: Diagnosis not present

## 2015-11-12 DIAGNOSIS — E039 Hypothyroidism, unspecified: Secondary | ICD-10-CM | POA: Diagnosis not present

## 2015-11-12 DIAGNOSIS — Z809 Family history of malignant neoplasm, unspecified: Secondary | ICD-10-CM | POA: Diagnosis not present

## 2015-11-12 DIAGNOSIS — F329 Major depressive disorder, single episode, unspecified: Secondary | ICD-10-CM | POA: Diagnosis not present

## 2015-11-12 DIAGNOSIS — G8918 Other acute postprocedural pain: Secondary | ICD-10-CM | POA: Diagnosis not present

## 2015-11-12 DIAGNOSIS — K219 Gastro-esophageal reflux disease without esophagitis: Secondary | ICD-10-CM | POA: Diagnosis not present

## 2015-11-12 DIAGNOSIS — Z79899 Other long term (current) drug therapy: Secondary | ICD-10-CM | POA: Diagnosis not present

## 2015-11-12 DIAGNOSIS — Z833 Family history of diabetes mellitus: Secondary | ICD-10-CM | POA: Diagnosis not present

## 2015-11-12 DIAGNOSIS — E785 Hyperlipidemia, unspecified: Secondary | ICD-10-CM | POA: Diagnosis not present

## 2015-11-12 DIAGNOSIS — F419 Anxiety disorder, unspecified: Secondary | ICD-10-CM | POA: Diagnosis not present

## 2015-11-13 DIAGNOSIS — G8918 Other acute postprocedural pain: Secondary | ICD-10-CM | POA: Diagnosis not present

## 2015-11-14 DIAGNOSIS — G8918 Other acute postprocedural pain: Secondary | ICD-10-CM | POA: Diagnosis not present

## 2015-11-14 DIAGNOSIS — Z96652 Presence of left artificial knee joint: Secondary | ICD-10-CM | POA: Diagnosis not present

## 2015-11-15 DIAGNOSIS — Z471 Aftercare following joint replacement surgery: Secondary | ICD-10-CM | POA: Diagnosis not present

## 2015-11-15 DIAGNOSIS — Z96652 Presence of left artificial knee joint: Secondary | ICD-10-CM | POA: Diagnosis not present

## 2015-11-19 DIAGNOSIS — Z96652 Presence of left artificial knee joint: Secondary | ICD-10-CM | POA: Diagnosis not present

## 2015-11-19 DIAGNOSIS — Z471 Aftercare following joint replacement surgery: Secondary | ICD-10-CM | POA: Diagnosis not present

## 2015-11-24 DIAGNOSIS — Z96652 Presence of left artificial knee joint: Secondary | ICD-10-CM | POA: Diagnosis not present

## 2015-11-24 DIAGNOSIS — Z471 Aftercare following joint replacement surgery: Secondary | ICD-10-CM | POA: Diagnosis not present

## 2015-11-27 DIAGNOSIS — Z471 Aftercare following joint replacement surgery: Secondary | ICD-10-CM | POA: Diagnosis not present

## 2015-11-27 DIAGNOSIS — M6281 Muscle weakness (generalized): Secondary | ICD-10-CM | POA: Diagnosis not present

## 2015-11-27 DIAGNOSIS — Z96652 Presence of left artificial knee joint: Secondary | ICD-10-CM | POA: Diagnosis not present

## 2015-11-27 DIAGNOSIS — M25662 Stiffness of left knee, not elsewhere classified: Secondary | ICD-10-CM | POA: Diagnosis not present

## 2015-11-27 DIAGNOSIS — M25562 Pain in left knee: Secondary | ICD-10-CM | POA: Diagnosis not present

## 2015-12-16 DIAGNOSIS — Z471 Aftercare following joint replacement surgery: Secondary | ICD-10-CM | POA: Diagnosis not present

## 2015-12-16 DIAGNOSIS — Z96652 Presence of left artificial knee joint: Secondary | ICD-10-CM | POA: Diagnosis not present

## 2015-12-29 DIAGNOSIS — M6281 Muscle weakness (generalized): Secondary | ICD-10-CM | POA: Diagnosis not present

## 2015-12-29 DIAGNOSIS — Z96652 Presence of left artificial knee joint: Secondary | ICD-10-CM | POA: Diagnosis not present

## 2015-12-29 DIAGNOSIS — M25662 Stiffness of left knee, not elsewhere classified: Secondary | ICD-10-CM | POA: Diagnosis not present

## 2015-12-29 DIAGNOSIS — Z471 Aftercare following joint replacement surgery: Secondary | ICD-10-CM | POA: Diagnosis not present

## 2015-12-29 DIAGNOSIS — M25562 Pain in left knee: Secondary | ICD-10-CM | POA: Diagnosis not present

## 2016-01-13 DIAGNOSIS — Z79890 Hormone replacement therapy (postmenopausal): Secondary | ICD-10-CM | POA: Diagnosis not present

## 2016-01-13 DIAGNOSIS — E039 Hypothyroidism, unspecified: Secondary | ICD-10-CM | POA: Diagnosis not present

## 2016-01-13 DIAGNOSIS — Z6830 Body mass index (BMI) 30.0-30.9, adult: Secondary | ICD-10-CM | POA: Diagnosis not present

## 2016-01-13 DIAGNOSIS — E785 Hyperlipidemia, unspecified: Secondary | ICD-10-CM | POA: Diagnosis not present

## 2016-01-13 DIAGNOSIS — R300 Dysuria: Secondary | ICD-10-CM | POA: Diagnosis not present

## 2016-01-26 DIAGNOSIS — Z96652 Presence of left artificial knee joint: Secondary | ICD-10-CM | POA: Diagnosis not present

## 2016-01-26 DIAGNOSIS — M25562 Pain in left knee: Secondary | ICD-10-CM | POA: Diagnosis not present

## 2016-01-26 DIAGNOSIS — M6281 Muscle weakness (generalized): Secondary | ICD-10-CM | POA: Diagnosis not present

## 2016-01-26 DIAGNOSIS — M25662 Stiffness of left knee, not elsewhere classified: Secondary | ICD-10-CM | POA: Diagnosis not present

## 2016-01-26 DIAGNOSIS — Z471 Aftercare following joint replacement surgery: Secondary | ICD-10-CM | POA: Diagnosis not present

## 2016-02-20 DIAGNOSIS — Z139 Encounter for screening, unspecified: Secondary | ICD-10-CM | POA: Diagnosis not present

## 2016-02-20 DIAGNOSIS — Z1231 Encounter for screening mammogram for malignant neoplasm of breast: Secondary | ICD-10-CM | POA: Diagnosis not present

## 2016-06-11 DIAGNOSIS — M5137 Other intervertebral disc degeneration, lumbosacral region: Secondary | ICD-10-CM | POA: Diagnosis not present

## 2016-06-18 ENCOUNTER — Other Ambulatory Visit (HOSPITAL_BASED_OUTPATIENT_CLINIC_OR_DEPARTMENT_OTHER): Payer: Self-pay | Admitting: Neurosurgery

## 2016-06-18 DIAGNOSIS — M5137 Other intervertebral disc degeneration, lumbosacral region: Secondary | ICD-10-CM

## 2016-06-19 ENCOUNTER — Ambulatory Visit (HOSPITAL_BASED_OUTPATIENT_CLINIC_OR_DEPARTMENT_OTHER)
Admission: RE | Admit: 2016-06-19 | Discharge: 2016-06-19 | Disposition: A | Discharge status: Home / Self Care | Payer: PPO | Source: Ambulatory Visit | Attending: Neurosurgery | Admitting: Neurosurgery

## 2016-06-19 DIAGNOSIS — M2578 Osteophyte, vertebrae: Secondary | ICD-10-CM | POA: Diagnosis not present

## 2016-06-19 DIAGNOSIS — M5137 Other intervertebral disc degeneration, lumbosacral region: Secondary | ICD-10-CM | POA: Insufficient documentation

## 2016-06-19 DIAGNOSIS — M5136 Other intervertebral disc degeneration, lumbar region: Secondary | ICD-10-CM | POA: Insufficient documentation

## 2016-06-22 DIAGNOSIS — M5137 Other intervertebral disc degeneration, lumbosacral region: Secondary | ICD-10-CM | POA: Diagnosis not present

## 2016-06-30 ENCOUNTER — Other Ambulatory Visit: Payer: Self-pay | Admitting: Neurosurgery

## 2016-06-30 DIAGNOSIS — M5137 Other intervertebral disc degeneration, lumbosacral region: Secondary | ICD-10-CM

## 2016-07-15 DIAGNOSIS — Z6829 Body mass index (BMI) 29.0-29.9, adult: Secondary | ICD-10-CM | POA: Diagnosis not present

## 2016-07-15 DIAGNOSIS — M461 Sacroiliitis, not elsewhere classified: Secondary | ICD-10-CM | POA: Diagnosis not present

## 2016-07-15 DIAGNOSIS — M5416 Radiculopathy, lumbar region: Secondary | ICD-10-CM | POA: Diagnosis not present

## 2016-07-15 DIAGNOSIS — M48061 Spinal stenosis, lumbar region without neurogenic claudication: Secondary | ICD-10-CM | POA: Diagnosis not present

## 2016-08-10 DIAGNOSIS — M461 Sacroiliitis, not elsewhere classified: Secondary | ICD-10-CM | POA: Diagnosis not present

## 2016-08-10 DIAGNOSIS — Z6829 Body mass index (BMI) 29.0-29.9, adult: Secondary | ICD-10-CM | POA: Diagnosis not present

## 2016-08-10 DIAGNOSIS — M7061 Trochanteric bursitis, right hip: Secondary | ICD-10-CM | POA: Diagnosis not present

## 2016-08-20 DIAGNOSIS — M5137 Other intervertebral disc degeneration, lumbosacral region: Secondary | ICD-10-CM | POA: Diagnosis not present

## 2016-09-02 ENCOUNTER — Other Ambulatory Visit (HOSPITAL_BASED_OUTPATIENT_CLINIC_OR_DEPARTMENT_OTHER): Payer: Self-pay | Admitting: Neurosurgery

## 2016-09-02 DIAGNOSIS — M5137 Other intervertebral disc degeneration, lumbosacral region: Secondary | ICD-10-CM

## 2016-09-03 ENCOUNTER — Ambulatory Visit (HOSPITAL_BASED_OUTPATIENT_CLINIC_OR_DEPARTMENT_OTHER)
Admission: RE | Admit: 2016-09-03 | Discharge: 2016-09-03 | Disposition: A | Discharge status: Home / Self Care | Payer: PPO | Source: Ambulatory Visit | Attending: Neurosurgery | Admitting: Neurosurgery

## 2016-09-03 DIAGNOSIS — M545 Low back pain: Secondary | ICD-10-CM | POA: Diagnosis not present

## 2016-09-03 DIAGNOSIS — M4327 Fusion of spine, lumbosacral region: Secondary | ICD-10-CM | POA: Diagnosis not present

## 2016-09-03 DIAGNOSIS — M5137 Other intervertebral disc degeneration, lumbosacral region: Secondary | ICD-10-CM | POA: Insufficient documentation

## 2016-09-03 DIAGNOSIS — S22089A Unspecified fracture of T11-T12 vertebra, initial encounter for closed fracture: Secondary | ICD-10-CM | POA: Diagnosis not present

## 2016-09-03 DIAGNOSIS — X58XXXA Exposure to other specified factors, initial encounter: Secondary | ICD-10-CM | POA: Diagnosis not present

## 2016-09-07 DIAGNOSIS — S22000A Wedge compression fracture of unspecified thoracic vertebra, initial encounter for closed fracture: Secondary | ICD-10-CM | POA: Diagnosis not present

## 2016-09-07 DIAGNOSIS — M5137 Other intervertebral disc degeneration, lumbosacral region: Secondary | ICD-10-CM | POA: Diagnosis not present

## 2016-09-28 DIAGNOSIS — S22000A Wedge compression fracture of unspecified thoracic vertebra, initial encounter for closed fracture: Secondary | ICD-10-CM | POA: Diagnosis not present

## 2016-09-28 DIAGNOSIS — M5136 Other intervertebral disc degeneration, lumbar region: Secondary | ICD-10-CM | POA: Diagnosis not present

## 2016-11-16 DIAGNOSIS — H04123 Dry eye syndrome of bilateral lacrimal glands: Secondary | ICD-10-CM | POA: Diagnosis not present

## 2016-11-16 DIAGNOSIS — H40053 Ocular hypertension, bilateral: Secondary | ICD-10-CM | POA: Diagnosis not present

## 2016-11-22 DIAGNOSIS — S22000A Wedge compression fracture of unspecified thoracic vertebra, initial encounter for closed fracture: Secondary | ICD-10-CM | POA: Diagnosis not present

## 2016-12-03 DIAGNOSIS — H04123 Dry eye syndrome of bilateral lacrimal glands: Secondary | ICD-10-CM | POA: Diagnosis not present

## 2016-12-03 DIAGNOSIS — H401224 Low-tension glaucoma, left eye, indeterminate stage: Secondary | ICD-10-CM | POA: Diagnosis not present

## 2016-12-03 DIAGNOSIS — H40053 Ocular hypertension, bilateral: Secondary | ICD-10-CM | POA: Diagnosis not present

## 2016-12-20 DIAGNOSIS — E785 Hyperlipidemia, unspecified: Secondary | ICD-10-CM | POA: Diagnosis not present

## 2016-12-20 DIAGNOSIS — K210 Gastro-esophageal reflux disease with esophagitis: Secondary | ICD-10-CM | POA: Diagnosis not present

## 2016-12-20 DIAGNOSIS — E039 Hypothyroidism, unspecified: Secondary | ICD-10-CM | POA: Diagnosis not present

## 2016-12-20 DIAGNOSIS — F3342 Major depressive disorder, recurrent, in full remission: Secondary | ICD-10-CM | POA: Diagnosis not present

## 2016-12-20 DIAGNOSIS — Z6830 Body mass index (BMI) 30.0-30.9, adult: Secondary | ICD-10-CM | POA: Diagnosis not present

## 2016-12-28 DIAGNOSIS — H401224 Low-tension glaucoma, left eye, indeterminate stage: Secondary | ICD-10-CM | POA: Diagnosis not present

## 2016-12-28 DIAGNOSIS — H40053 Ocular hypertension, bilateral: Secondary | ICD-10-CM | POA: Diagnosis not present

## 2016-12-28 DIAGNOSIS — H04123 Dry eye syndrome of bilateral lacrimal glands: Secondary | ICD-10-CM | POA: Diagnosis not present

## 2017-03-30 DIAGNOSIS — H401224 Low-tension glaucoma, left eye, indeterminate stage: Secondary | ICD-10-CM | POA: Diagnosis not present

## 2017-03-30 DIAGNOSIS — H40053 Ocular hypertension, bilateral: Secondary | ICD-10-CM | POA: Diagnosis not present

## 2017-03-30 DIAGNOSIS — H04123 Dry eye syndrome of bilateral lacrimal glands: Secondary | ICD-10-CM | POA: Diagnosis not present

## 2017-04-19 DIAGNOSIS — K210 Gastro-esophageal reflux disease with esophagitis: Secondary | ICD-10-CM | POA: Diagnosis not present

## 2017-04-19 DIAGNOSIS — Z79890 Hormone replacement therapy (postmenopausal): Secondary | ICD-10-CM | POA: Diagnosis not present

## 2017-04-19 DIAGNOSIS — Z23 Encounter for immunization: Secondary | ICD-10-CM | POA: Diagnosis not present

## 2017-04-19 DIAGNOSIS — E039 Hypothyroidism, unspecified: Secondary | ICD-10-CM | POA: Diagnosis not present

## 2017-04-19 DIAGNOSIS — E785 Hyperlipidemia, unspecified: Secondary | ICD-10-CM | POA: Diagnosis not present

## 2017-07-20 DIAGNOSIS — H40053 Ocular hypertension, bilateral: Secondary | ICD-10-CM | POA: Diagnosis not present

## 2017-07-20 DIAGNOSIS — H04123 Dry eye syndrome of bilateral lacrimal glands: Secondary | ICD-10-CM | POA: Diagnosis not present

## 2017-07-20 DIAGNOSIS — H401224 Low-tension glaucoma, left eye, indeterminate stage: Secondary | ICD-10-CM | POA: Diagnosis not present

## 2017-08-03 DIAGNOSIS — E785 Hyperlipidemia, unspecified: Secondary | ICD-10-CM | POA: Diagnosis not present

## 2017-08-03 DIAGNOSIS — Z79890 Hormone replacement therapy (postmenopausal): Secondary | ICD-10-CM | POA: Diagnosis not present

## 2017-08-03 DIAGNOSIS — E039 Hypothyroidism, unspecified: Secondary | ICD-10-CM | POA: Diagnosis not present

## 2017-08-03 DIAGNOSIS — F3342 Major depressive disorder, recurrent, in full remission: Secondary | ICD-10-CM | POA: Diagnosis not present

## 2017-08-22 DIAGNOSIS — H40053 Ocular hypertension, bilateral: Secondary | ICD-10-CM | POA: Diagnosis not present

## 2017-08-22 DIAGNOSIS — H04123 Dry eye syndrome of bilateral lacrimal glands: Secondary | ICD-10-CM | POA: Diagnosis not present

## 2017-09-13 DIAGNOSIS — H401224 Low-tension glaucoma, left eye, indeterminate stage: Secondary | ICD-10-CM | POA: Diagnosis not present

## 2017-09-13 DIAGNOSIS — H04123 Dry eye syndrome of bilateral lacrimal glands: Secondary | ICD-10-CM | POA: Diagnosis not present

## 2017-09-13 DIAGNOSIS — H40053 Ocular hypertension, bilateral: Secondary | ICD-10-CM | POA: Diagnosis not present

## 2017-09-29 DIAGNOSIS — M1711 Unilateral primary osteoarthritis, right knee: Secondary | ICD-10-CM | POA: Diagnosis not present

## 2017-09-29 DIAGNOSIS — Z96652 Presence of left artificial knee joint: Secondary | ICD-10-CM | POA: Diagnosis not present

## 2017-10-12 DIAGNOSIS — H40053 Ocular hypertension, bilateral: Secondary | ICD-10-CM | POA: Diagnosis not present

## 2017-10-12 DIAGNOSIS — H04123 Dry eye syndrome of bilateral lacrimal glands: Secondary | ICD-10-CM | POA: Diagnosis not present

## 2017-10-12 DIAGNOSIS — H401224 Low-tension glaucoma, left eye, indeterminate stage: Secondary | ICD-10-CM | POA: Diagnosis not present

## 2017-10-19 DIAGNOSIS — G44309 Post-traumatic headache, unspecified, not intractable: Secondary | ICD-10-CM | POA: Diagnosis not present

## 2017-10-19 DIAGNOSIS — R197 Diarrhea, unspecified: Secondary | ICD-10-CM | POA: Diagnosis not present

## 2017-10-19 DIAGNOSIS — M1712 Unilateral primary osteoarthritis, left knee: Secondary | ICD-10-CM | POA: Diagnosis not present

## 2017-10-19 DIAGNOSIS — S0990XA Unspecified injury of head, initial encounter: Secondary | ICD-10-CM | POA: Diagnosis not present

## 2017-10-20 DIAGNOSIS — R51 Headache: Secondary | ICD-10-CM | POA: Diagnosis not present

## 2017-10-20 DIAGNOSIS — G44309 Post-traumatic headache, unspecified, not intractable: Secondary | ICD-10-CM | POA: Diagnosis not present

## 2017-10-20 DIAGNOSIS — S0990XA Unspecified injury of head, initial encounter: Secondary | ICD-10-CM | POA: Diagnosis not present

## 2017-10-20 DIAGNOSIS — S0990XD Unspecified injury of head, subsequent encounter: Secondary | ICD-10-CM | POA: Diagnosis not present

## 2017-10-20 DIAGNOSIS — R197 Diarrhea, unspecified: Secondary | ICD-10-CM | POA: Diagnosis not present

## 2018-02-09 DIAGNOSIS — E039 Hypothyroidism, unspecified: Secondary | ICD-10-CM | POA: Diagnosis not present

## 2018-02-09 DIAGNOSIS — Z79899 Other long term (current) drug therapy: Secondary | ICD-10-CM | POA: Diagnosis not present

## 2018-02-09 DIAGNOSIS — Z5181 Encounter for therapeutic drug level monitoring: Secondary | ICD-10-CM | POA: Diagnosis not present

## 2018-02-13 DIAGNOSIS — M1712 Unilateral primary osteoarthritis, left knee: Secondary | ICD-10-CM | POA: Diagnosis not present

## 2018-02-13 DIAGNOSIS — E039 Hypothyroidism, unspecified: Secondary | ICD-10-CM | POA: Diagnosis not present

## 2018-02-24 DIAGNOSIS — J209 Acute bronchitis, unspecified: Secondary | ICD-10-CM | POA: Diagnosis not present

## 2018-03-20 IMAGING — CT CT L SPINE W/O CM
3 series · 13 of 33 positions shown, 16 images · non-contrast
Comparison: 06/19/2016

CLINICAL DATA: Chronic low back pain.  Previous lumbar fusion.

EXAM:
CT LUMBAR SPINE WITHOUT CONTRAST
TECHNIQUE: Multidetector CT imaging of the lumbar spine was performed without
intravenous contrast administration. Multiplanar CT image
reconstructions were also generated.

[Series 4: l spine soft · axial · 0.38mm/px · z∈[-244,-70]mm · 5 of 127 slices shown, 7 images]
[im 20/127  soft-tissue]
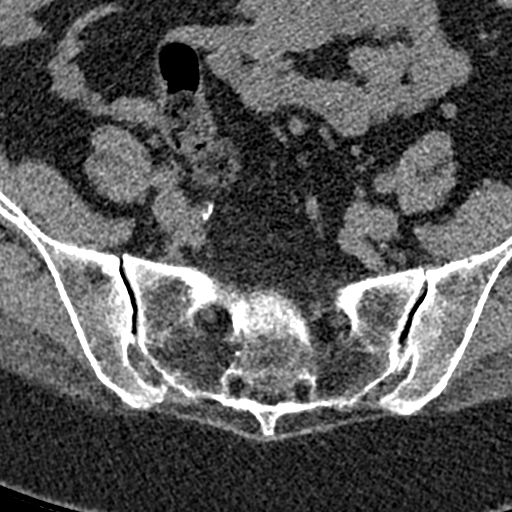
[im 20/127  bone]
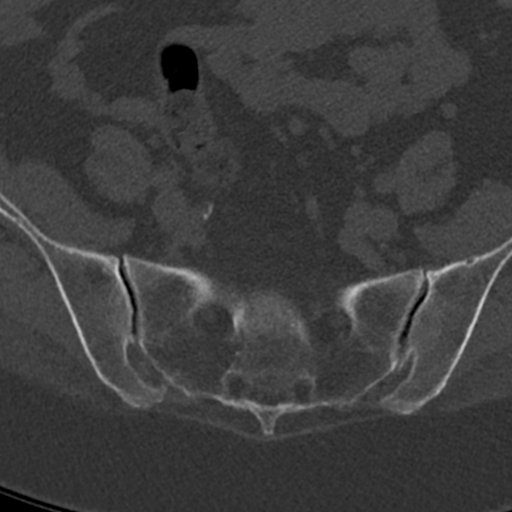
[im 39/127  bone]
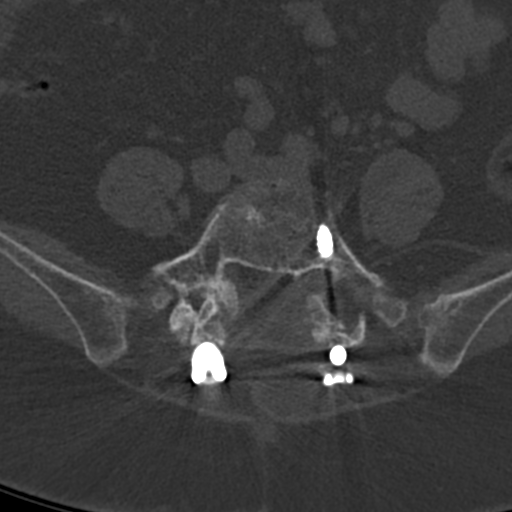
[im 68/127  bone]
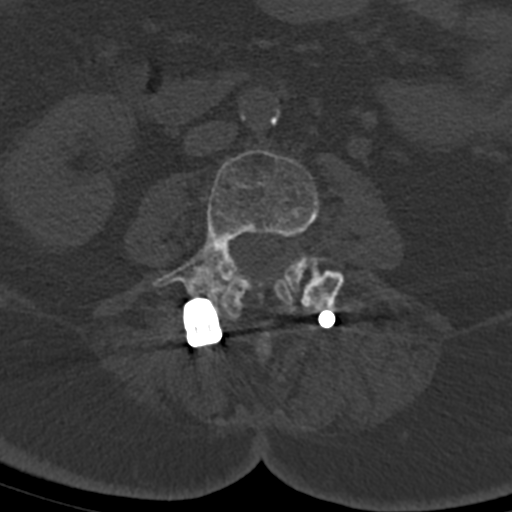
[im 88/127  bone]
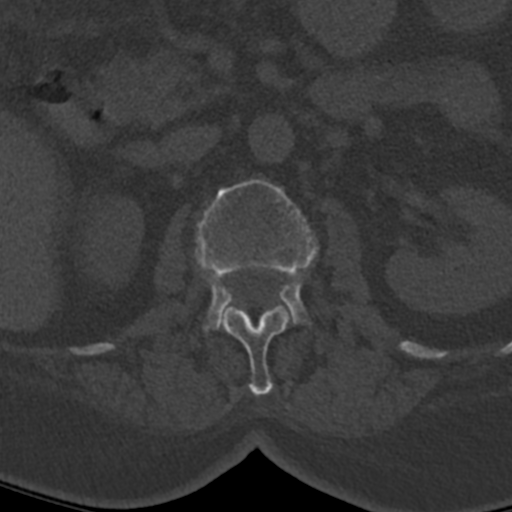
[im 107/127  soft-tissue]
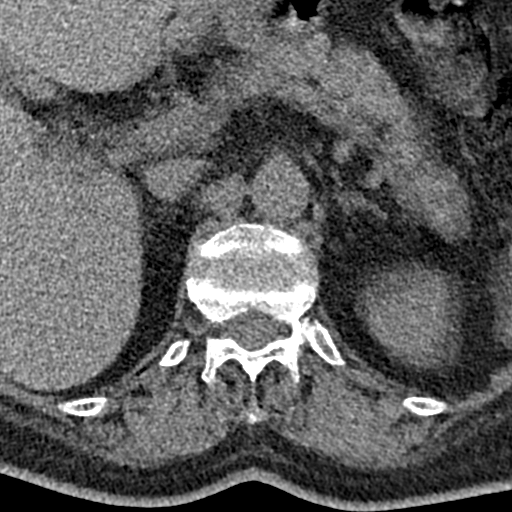
[im 107/127  bone]
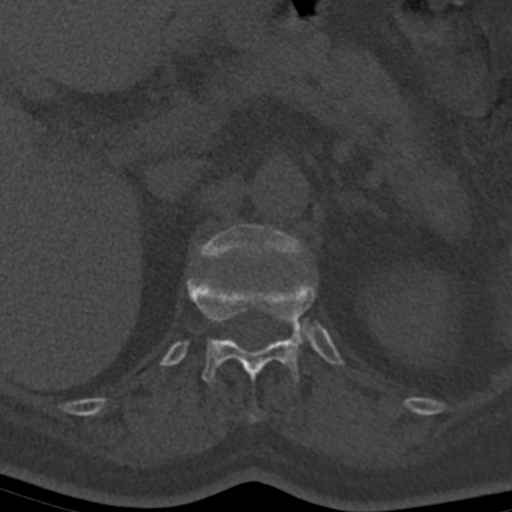

[Series 7: sagittal bone · sagittal · 0.37mm/px · 5 of 80 slices shown, 6 images]
[im 27/80  bone]
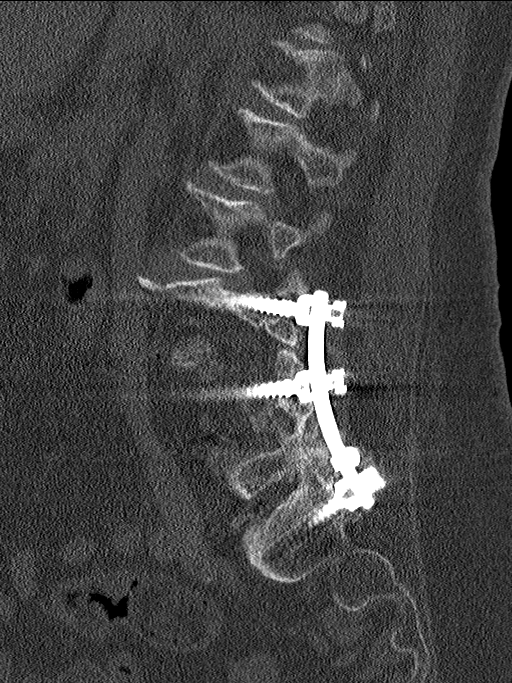
[im 33/80  bone]
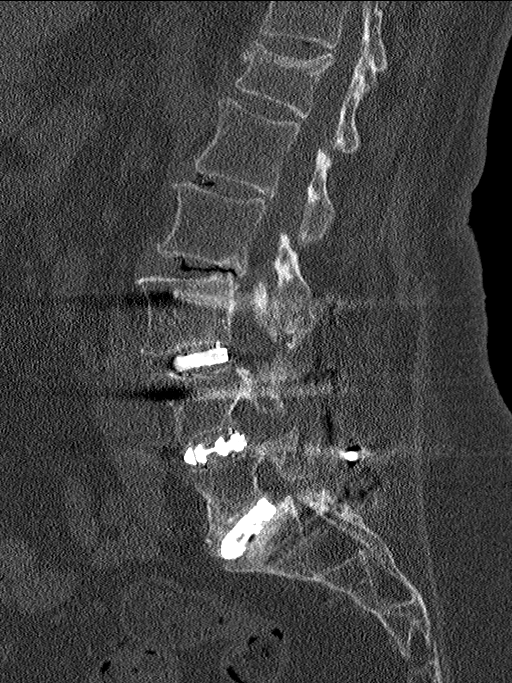
[im 40/80  soft-tissue]
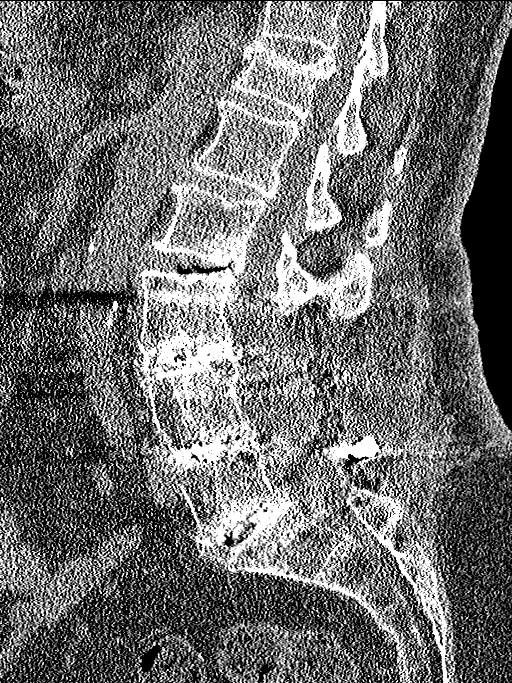
[im 40/80  bone]
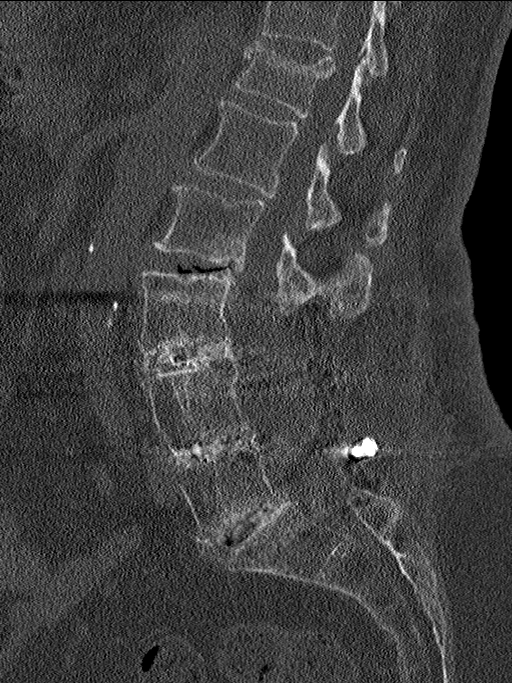
[im 47/80  bone]
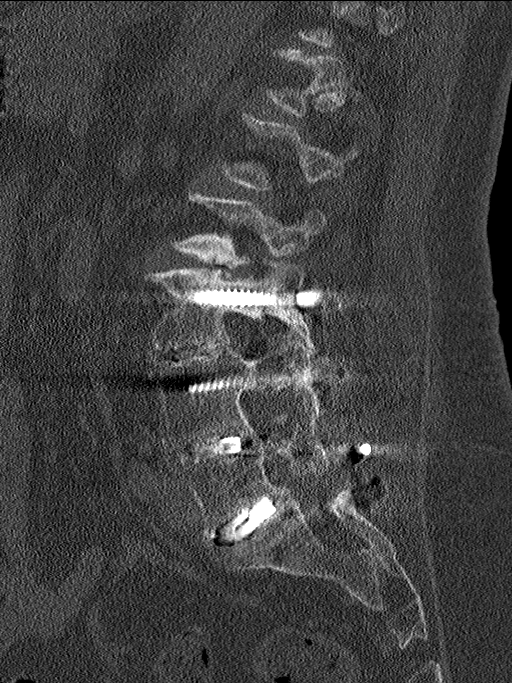
[im 53/80  bone]
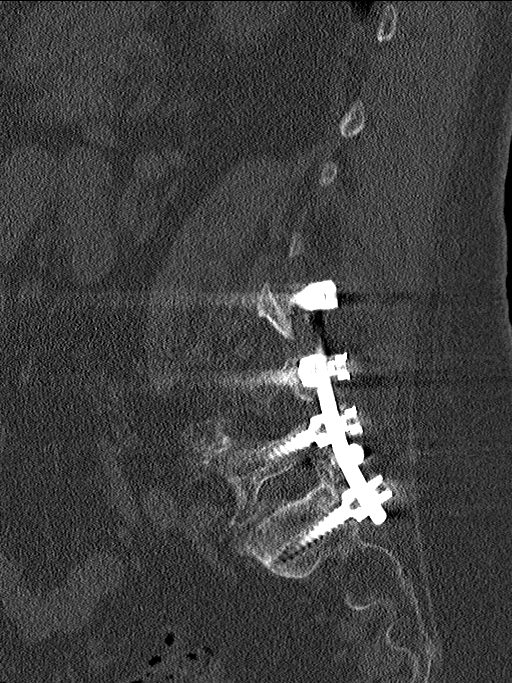

[Series 8: coronal bone · coronal · 0.37mm/px · 3 of 79 slices shown]
[im 16/79  bone]
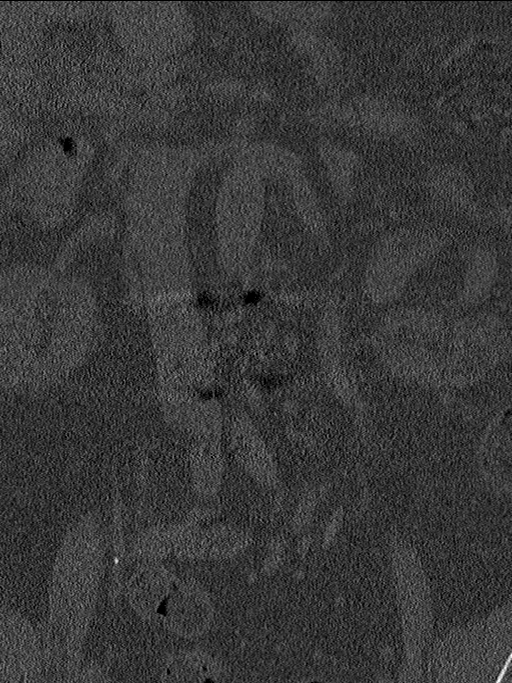
[im 32/79  bone]
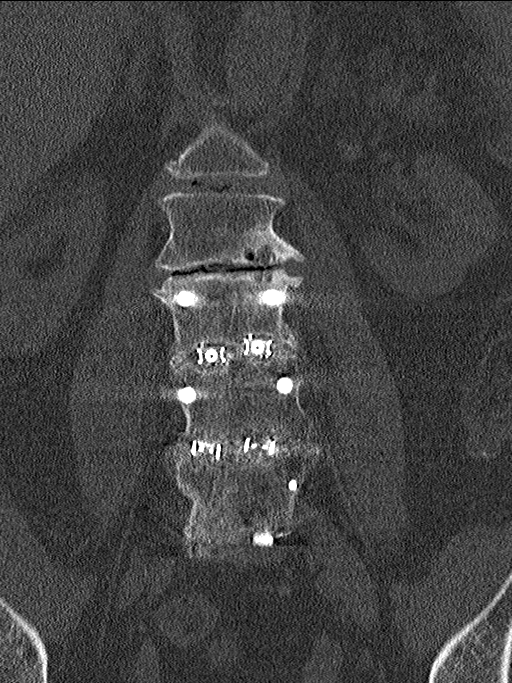
[im 47/79  bone]
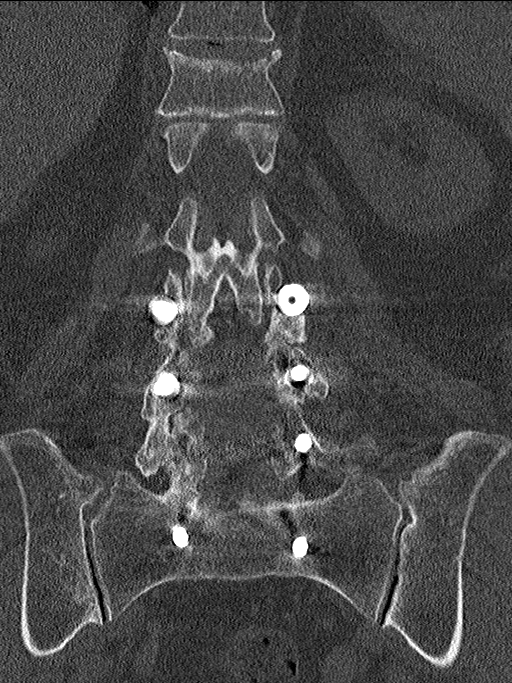

[13 of 33 positions shown; findings below may reference images not displayed]

FINDINGS: Segmentation: 5 lumbar type vertebral bodies.

Alignment: 3 mm retrolisthesis at L2-3.

Vertebrae: Superior endplate fracture at T12 not present on the
previous study. Loss of height of 20%. Posterior bowing of the
posterosuperior margin of the vertebral body by 4 mm.

Paraspinal and other soft tissues: Negative

Disc levels: T12-L1:  Unremarkable.

L1-2: Mild disc degeneration.  No bulge or herniation.

L2-3: Worsening of degenerative changes. Retrolisthesis of 3 mm.
Worsened disc degeneration with more vacuum phenomenon. Endplate
osteophytes and bulging of the disc. Bilateral facet hypertrophy.
Canal, lateral recess and foraminal narrowing could be symptomatic.

L3 to sacrum: Previous discectomy and fusion procedure. Fusion
appears solid. Components appear well positioned. Wide patency of
the canal and foramina.

Mild sacroiliac osteoarthritis.
IMPRESSION: Acute or subacute superior endplate fracture at T12 with loss of
height of 20%. Posterior bowing of the posterosuperior margin of the
vertebral body by 4 mm without likely significant canal
encroachment.

Worsened adjacent segment degenerative disease at L2-3.
Retrolisthesis of 3 mm. Worsened disc degeneration with more vacuum
phenomenon. Facet arthropathy. Narrowing of the lateral recesses,
central canal and neural foramina could cause neural compression.

Satisfactory appearance in the fusion segment from L3 to the sacrum.

## 2018-04-04 DIAGNOSIS — Z23 Encounter for immunization: Secondary | ICD-10-CM | POA: Diagnosis not present

## 2018-04-04 DIAGNOSIS — Z1239 Encounter for other screening for malignant neoplasm of breast: Secondary | ICD-10-CM | POA: Diagnosis not present

## 2018-04-04 DIAGNOSIS — R300 Dysuria: Secondary | ICD-10-CM | POA: Diagnosis not present

## 2018-04-04 DIAGNOSIS — N3000 Acute cystitis without hematuria: Secondary | ICD-10-CM | POA: Diagnosis not present

## 2018-04-10 DIAGNOSIS — E039 Hypothyroidism, unspecified: Secondary | ICD-10-CM | POA: Diagnosis not present

## 2018-05-26 DIAGNOSIS — R32 Unspecified urinary incontinence: Secondary | ICD-10-CM | POA: Diagnosis not present

## 2018-05-26 DIAGNOSIS — R159 Full incontinence of feces: Secondary | ICD-10-CM | POA: Diagnosis not present

## 2018-05-26 DIAGNOSIS — M48061 Spinal stenosis, lumbar region without neurogenic claudication: Secondary | ICD-10-CM | POA: Diagnosis not present

## 2018-06-02 DIAGNOSIS — M25552 Pain in left hip: Secondary | ICD-10-CM | POA: Diagnosis not present

## 2018-07-03 DIAGNOSIS — R32 Unspecified urinary incontinence: Secondary | ICD-10-CM | POA: Diagnosis not present

## 2018-07-03 DIAGNOSIS — N3941 Urge incontinence: Secondary | ICD-10-CM | POA: Diagnosis not present

## 2018-07-10 DIAGNOSIS — J018 Other acute sinusitis: Secondary | ICD-10-CM | POA: Diagnosis not present

## 2018-08-03 DIAGNOSIS — N3941 Urge incontinence: Secondary | ICD-10-CM | POA: Diagnosis not present

## 2018-08-16 DIAGNOSIS — E785 Hyperlipidemia, unspecified: Secondary | ICD-10-CM | POA: Diagnosis not present

## 2018-08-16 DIAGNOSIS — M1712 Unilateral primary osteoarthritis, left knee: Secondary | ICD-10-CM | POA: Diagnosis not present

## 2018-08-16 DIAGNOSIS — Z0000 Encounter for general adult medical examination without abnormal findings: Secondary | ICD-10-CM | POA: Diagnosis not present

## 2018-08-16 DIAGNOSIS — Z1159 Encounter for screening for other viral diseases: Secondary | ICD-10-CM | POA: Diagnosis not present

## 2018-08-16 DIAGNOSIS — M940 Chondrocostal junction syndrome [Tietze]: Secondary | ICD-10-CM | POA: Diagnosis not present

## 2018-08-16 DIAGNOSIS — Z23 Encounter for immunization: Secondary | ICD-10-CM | POA: Diagnosis not present

## 2018-08-16 DIAGNOSIS — M48061 Spinal stenosis, lumbar region without neurogenic claudication: Secondary | ICD-10-CM | POA: Diagnosis not present

## 2018-08-16 DIAGNOSIS — Z79890 Hormone replacement therapy (postmenopausal): Secondary | ICD-10-CM | POA: Diagnosis not present

## 2018-08-16 DIAGNOSIS — E039 Hypothyroidism, unspecified: Secondary | ICD-10-CM | POA: Diagnosis not present

## 2018-09-06 DIAGNOSIS — H04123 Dry eye syndrome of bilateral lacrimal glands: Secondary | ICD-10-CM | POA: Diagnosis not present

## 2018-09-06 DIAGNOSIS — H40053 Ocular hypertension, bilateral: Secondary | ICD-10-CM | POA: Diagnosis not present

## 2018-11-01 DIAGNOSIS — H40053 Ocular hypertension, bilateral: Secondary | ICD-10-CM | POA: Diagnosis not present

## 2018-11-01 DIAGNOSIS — H04123 Dry eye syndrome of bilateral lacrimal glands: Secondary | ICD-10-CM | POA: Diagnosis not present

## 2018-11-21 DIAGNOSIS — R030 Elevated blood-pressure reading, without diagnosis of hypertension: Secondary | ICD-10-CM | POA: Diagnosis not present

## 2018-11-21 DIAGNOSIS — M5416 Radiculopathy, lumbar region: Secondary | ICD-10-CM | POA: Diagnosis not present

## 2018-11-21 DIAGNOSIS — Z6829 Body mass index (BMI) 29.0-29.9, adult: Secondary | ICD-10-CM | POA: Diagnosis not present

## 2018-11-22 DIAGNOSIS — M5416 Radiculopathy, lumbar region: Secondary | ICD-10-CM | POA: Diagnosis not present

## 2018-11-23 DIAGNOSIS — M48061 Spinal stenosis, lumbar region without neurogenic claudication: Secondary | ICD-10-CM | POA: Diagnosis not present

## 2018-11-23 DIAGNOSIS — M5125 Other intervertebral disc displacement, thoracolumbar region: Secondary | ICD-10-CM | POA: Diagnosis not present

## 2018-11-23 DIAGNOSIS — M5416 Radiculopathy, lumbar region: Secondary | ICD-10-CM | POA: Diagnosis not present

## 2018-12-01 DIAGNOSIS — M5416 Radiculopathy, lumbar region: Secondary | ICD-10-CM | POA: Diagnosis not present

## 2018-12-01 DIAGNOSIS — M48062 Spinal stenosis, lumbar region with neurogenic claudication: Secondary | ICD-10-CM | POA: Diagnosis not present

## 2018-12-06 DIAGNOSIS — R030 Elevated blood-pressure reading, without diagnosis of hypertension: Secondary | ICD-10-CM | POA: Diagnosis not present

## 2018-12-06 DIAGNOSIS — M48062 Spinal stenosis, lumbar region with neurogenic claudication: Secondary | ICD-10-CM | POA: Diagnosis not present

## 2018-12-06 DIAGNOSIS — Z6829 Body mass index (BMI) 29.0-29.9, adult: Secondary | ICD-10-CM | POA: Diagnosis not present

## 2018-12-06 DIAGNOSIS — M5416 Radiculopathy, lumbar region: Secondary | ICD-10-CM | POA: Diagnosis not present

## 2018-12-27 DIAGNOSIS — R030 Elevated blood-pressure reading, without diagnosis of hypertension: Secondary | ICD-10-CM | POA: Diagnosis not present

## 2018-12-27 DIAGNOSIS — M48062 Spinal stenosis, lumbar region with neurogenic claudication: Secondary | ICD-10-CM | POA: Diagnosis not present

## 2018-12-27 DIAGNOSIS — M5416 Radiculopathy, lumbar region: Secondary | ICD-10-CM | POA: Diagnosis not present

## 2018-12-27 DIAGNOSIS — Z6830 Body mass index (BMI) 30.0-30.9, adult: Secondary | ICD-10-CM | POA: Diagnosis not present

## 2019-01-02 DIAGNOSIS — H40053 Ocular hypertension, bilateral: Secondary | ICD-10-CM | POA: Diagnosis not present

## 2019-01-02 DIAGNOSIS — H04123 Dry eye syndrome of bilateral lacrimal glands: Secondary | ICD-10-CM | POA: Diagnosis not present

## 2019-01-31 DIAGNOSIS — R030 Elevated blood-pressure reading, without diagnosis of hypertension: Secondary | ICD-10-CM | POA: Diagnosis not present

## 2019-01-31 DIAGNOSIS — M5416 Radiculopathy, lumbar region: Secondary | ICD-10-CM | POA: Diagnosis not present

## 2019-01-31 DIAGNOSIS — M48062 Spinal stenosis, lumbar region with neurogenic claudication: Secondary | ICD-10-CM | POA: Diagnosis not present

## 2019-02-05 DIAGNOSIS — E039 Hypothyroidism, unspecified: Secondary | ICD-10-CM | POA: Diagnosis not present

## 2019-02-05 DIAGNOSIS — R1012 Left upper quadrant pain: Secondary | ICD-10-CM | POA: Diagnosis not present

## 2019-02-05 DIAGNOSIS — K5900 Constipation, unspecified: Secondary | ICD-10-CM | POA: Diagnosis not present

## 2019-02-12 DIAGNOSIS — K8020 Calculus of gallbladder without cholecystitis without obstruction: Secondary | ICD-10-CM | POA: Diagnosis not present

## 2019-02-12 DIAGNOSIS — R1012 Left upper quadrant pain: Secondary | ICD-10-CM | POA: Diagnosis not present

## 2019-02-12 DIAGNOSIS — K760 Fatty (change of) liver, not elsewhere classified: Secondary | ICD-10-CM | POA: Diagnosis not present

## 2019-02-22 DIAGNOSIS — M4316 Spondylolisthesis, lumbar region: Secondary | ICD-10-CM | POA: Diagnosis not present

## 2019-02-22 DIAGNOSIS — M48062 Spinal stenosis, lumbar region with neurogenic claudication: Secondary | ICD-10-CM | POA: Diagnosis not present

## 2019-02-26 ENCOUNTER — Other Ambulatory Visit: Payer: Self-pay | Admitting: Neurosurgery

## 2019-02-26 DIAGNOSIS — M4316 Spondylolisthesis, lumbar region: Secondary | ICD-10-CM

## 2019-03-06 ENCOUNTER — Ambulatory Visit
Admission: RE | Admit: 2019-03-06 | Discharge: 2019-03-06 | Disposition: A | Discharge status: Home / Self Care | Payer: PPO | Source: Ambulatory Visit | Attending: Neurosurgery | Admitting: Neurosurgery

## 2019-03-06 ENCOUNTER — Other Ambulatory Visit: Payer: Self-pay

## 2019-03-06 DIAGNOSIS — M4316 Spondylolisthesis, lumbar region: Secondary | ICD-10-CM

## 2019-03-06 DIAGNOSIS — M48061 Spinal stenosis, lumbar region without neurogenic claudication: Secondary | ICD-10-CM | POA: Diagnosis not present

## 2019-03-06 DIAGNOSIS — M5136 Other intervertebral disc degeneration, lumbar region: Secondary | ICD-10-CM | POA: Diagnosis not present

## 2019-03-06 DIAGNOSIS — M5126 Other intervertebral disc displacement, lumbar region: Secondary | ICD-10-CM | POA: Diagnosis not present

## 2019-03-08 ENCOUNTER — Other Ambulatory Visit: Payer: Self-pay | Admitting: Neurosurgery

## 2019-03-08 DIAGNOSIS — M4316 Spondylolisthesis, lumbar region: Secondary | ICD-10-CM | POA: Diagnosis not present

## 2019-03-28 DIAGNOSIS — M25559 Pain in unspecified hip: Secondary | ICD-10-CM | POA: Diagnosis not present

## 2019-03-28 DIAGNOSIS — M4726 Other spondylosis with radiculopathy, lumbar region: Secondary | ICD-10-CM | POA: Diagnosis not present

## 2019-03-28 DIAGNOSIS — M5136 Other intervertebral disc degeneration, lumbar region: Secondary | ICD-10-CM | POA: Diagnosis not present

## 2019-03-28 DIAGNOSIS — M25551 Pain in right hip: Secondary | ICD-10-CM | POA: Diagnosis not present

## 2019-03-28 DIAGNOSIS — Z981 Arthrodesis status: Secondary | ICD-10-CM | POA: Diagnosis not present

## 2019-03-28 DIAGNOSIS — M25552 Pain in left hip: Secondary | ICD-10-CM | POA: Diagnosis not present

## 2019-03-28 DIAGNOSIS — M48062 Spinal stenosis, lumbar region with neurogenic claudication: Secondary | ICD-10-CM | POA: Diagnosis not present

## 2019-04-11 NOTE — Progress Notes (Signed)
[place], [place] - [address] [place] [address] Phone: [phone] Fax: [place], [place] - [address] [place] [address] Phone: [phone] Fax: [phone]      Your procedure is scheduled on Monday 04/16/2019.  Report to [facility] Main Entrance "A" at 05:30 A.M., and check in at the Admitting office.  Call this number if you have problems the morning of surgery:  [phone]  Call [phone] if you have any questions prior to your surgery date Monday-Friday 8am-4pm    Remember:  Do not eat or drink after midnight the night before your surgery    Take these medicines the morning of surgery with A SIP OF WATER: Acetaminophen (Tylenol) - if needed Acyclovir (Zovirax) - if needed Estradiol (Estrace) Levothyroxine (Synthroid) Omeprazole (Prilosec) Paroxetine (Paxil) Timolol (Timoptic)  7 days prior to surgery STOP taking any Aspirin (unless otherwise instructed by your surgeon), Aleve, Naproxen, Ibuprofen, Motrin, Advil, Goody's, BC's, all herbal medications, fish oil, and all vitamins.    The Morning of Surgery  Do not wear jewelry, make-up or nail polish.  Do not wear lotions, powders, perfumes, or deodorant  Do not shave 48 hours prior to surgery.    Do not bring valuables to the hospital.  [facility] is not responsible for any belongings or valuables.  If you are a smoker, DO NOT Smoke 24 hours prior to surgery  If you wear a CPAP at night please bring your mask, tubing, and machine the morning of surgery   Remember that you must have someone to transport you home after your surgery, and remain with you for 24 hours if you are discharged the same day.   Contacts, glasses, hearing aids, dentures or bridgework may not be worn into surgery.    Leave your suitcase in the car.  After surgery it may be brought to your  room.  For patients admitted to the hospital, discharge time will be determined by your treatment team.  Patients discharged the day of surgery will not be allowed to drive home.    Special instructions:   - Preparing For Surgery  Before surgery, you can play an important role. Because skin is not sterile, your skin needs to be as free of germs as possible. You can reduce the number of germs on your skin by washing with CHG (chlorahexidine gluconate) Soap before surgery.  CHG is an antiseptic cleaner which kills germs and bonds with the skin to continue killing germs even after washing.    Oral Hygiene is also important to reduce your risk of infection.  Remember - BRUSH YOUR TEETH THE MORNING OF SURGERY WITH YOUR REGULAR TOOTHPASTE  Please do not use if you have an allergy to CHG or antibacterial soaps. If your skin becomes reddened/irritated stop using the CHG.  Do not shave (including legs and underarms) for at least 48 hours prior to first CHG shower. It is OK to shave your face.  Please follow these instructions carefully.   1. Shower the NIGHT BEFORE SURGERY and the MORNING OF SURGERY with CHG Soap.   2. If you chose to wash your hair, wash your hair first as usual with your normal shampoo.  3. After you shampoo, rinse your hair and body thoroughly to remove the shampoo.  4. Use CHG as you would any other liquid soap. You can apply CHG directly to  the skin and wash gently with a scrungie or a clean washcloth.   5. Apply the CHG Soap to your body ONLY FROM THE NECK DOWN.  Do not use on open wounds or open sores. Avoid contact with your eyes, ears, mouth and genitals (private parts). Wash Face and genitals (private parts)  with your normal soap.   6. Wash thoroughly, paying special attention to the area where your surgery will be performed.  7. Thoroughly rinse your body with warm water from the neck down.  8. DO NOT shower/wash with your normal soap after using and  rinsing off the CHG Soap.  9. Pat yourself dry with a CLEAN TOWEL.  10. Wear CLEAN PAJAMAS to bed the night before surgery, wear comfortable clothes the morning of surgery  11. Place CLEAN SHEETS on your bed the night of your first shower and DO NOT SLEEP WITH PETS.    Day of Surgery:   Please shower the morning of surgery with the CHG soap Do not apply any deodorants/lotions.  Please wear clean clothes to the hospital/surgery center.   Remember to brush your teeth WITH YOUR REGULAR TOOTHPASTE.   Please read over the following fact sheets that you were given.

## 2019-04-12 ENCOUNTER — Encounter (HOSPITAL_COMMUNITY)
Admission: RE | Admit: 2019-04-12 | Discharge: 2019-04-12 | Disposition: A | Discharge status: Home / Self Care | Payer: PPO | Source: Ambulatory Visit | Attending: Neurosurgery | Admitting: Neurosurgery

## 2019-04-12 ENCOUNTER — Other Ambulatory Visit: Payer: Self-pay

## 2019-04-12 ENCOUNTER — Other Ambulatory Visit (HOSPITAL_COMMUNITY)
Admission: RE | Admit: 2019-04-12 | Discharge: 2019-04-12 | Disposition: A | Discharge status: Home / Self Care | Payer: PPO | Source: Ambulatory Visit | Attending: Neurosurgery | Admitting: Neurosurgery

## 2019-04-12 ENCOUNTER — Encounter (HOSPITAL_COMMUNITY): Payer: Self-pay | Admitting: *Deleted

## 2019-04-12 DIAGNOSIS — M4316 Spondylolisthesis, lumbar region: Secondary | ICD-10-CM | POA: Insufficient documentation

## 2019-04-12 DIAGNOSIS — Z01812 Encounter for preprocedural laboratory examination: Secondary | ICD-10-CM | POA: Insufficient documentation

## 2019-04-12 DIAGNOSIS — Z20828 Contact with and (suspected) exposure to other viral communicable diseases: Secondary | ICD-10-CM | POA: Diagnosis not present

## 2019-04-12 LAB — TYPE AND SCREEN
ABO/RH(D): O NEG
Antibody Screen: NEGATIVE

## 2019-04-12 LAB — SURGICAL PCR SCREEN
MRSA, PCR: NEGATIVE
Staphylococcus aureus: NEGATIVE

## 2019-04-12 LAB — CBC
HCT: 46 % (ref 36.0–46.0)
Hemoglobin: 14.9 g/dL (ref 12.0–15.0)
MCH: 29.4 pg (ref 26.0–34.0)
MCHC: 32.4 g/dL (ref 30.0–36.0)
MCV: 90.7 fL (ref 80.0–100.0)
Platelets: 332 10*3/uL (ref 150–400)
RBC: 5.07 MIL/uL (ref 3.87–5.11)
RDW: 12.4 % (ref 11.5–15.5)
WBC: 8.7 10*3/uL (ref 4.0–10.5)
nRBC: 0 % (ref 0.0–0.2)

## 2019-04-12 NOTE — Progress Notes (Signed)
PCP: Dr. Kathlee Nations Cardiologist: denies  EKG: n/a CXR: n/a ECHO: denies Stress Test: denies Cardiac Cath: denies  Going for Covid testing after appt today.  Patient denies shortness of breath, fever, cough, and chest pain at PAT appointment.  Patient verbalized understanding of instructions provided today at the PAT appointment.  Patient asked to review instructions at home and day of surgery.

## 2019-04-13 LAB — NOVEL CORONAVIRUS, NAA (HOSP ORDER, SEND-OUT TO REF LAB; TAT 18-24 HRS): SARS-CoV-2, NAA: NOT DETECTED

## 2019-04-15 NOTE — Anesthesia Preprocedure Evaluation (Addendum)
Anesthesia Evaluation  Patient identified by MRN, date of birth, ID band Patient awake    Reviewed: Allergy & Precautions, NPO status , Patient's Chart, lab work & pertinent test results  History of Anesthesia Complications (+) PONV and history of anesthetic complications  Airway Mallampati: II  TM Distance: >3 FB Neck ROM: Full    Dental  (+) Dental Advisory Given, Partial Upper, Partial Lower   Pulmonary former smoker,    Pulmonary exam normal breath sounds clear to auscultation       Cardiovascular negative cardio ROS Normal cardiovascular exam Rhythm:Regular Rate:Normal     Neuro/Psych  Headaches, PSYCHIATRIC DISORDERS Anxiety  Spondylolisthesis  Neuromuscular disease    GI/Hepatic Neg liver ROS, GERD  Medicated,  Endo/Other  Hypothyroidism   Renal/GU negative Renal ROS     Musculoskeletal  (+) Arthritis ,   Abdominal   Peds  Hematology negative hematology ROS (+)   Anesthesia Other Findings Day of surgery medications reviewed with the patient.  Reproductive/Obstetrics                           Anesthesia Physical Anesthesia Plan  ASA: II  Anesthesia Plan: General   Post-op Pain Management:    Induction: Intravenous  PONV Risk Score and Plan: 4 or greater and Dexamethasone, Ondansetron, Treatment may vary due to age or medical condition and TIVA  Airway Management Planned: Oral ETT  Additional Equipment:   Intra-op Plan:   Post-operative Plan: Extubation in OR  Informed Consent: I have reviewed the patients History and Physical, chart, labs and discussed the procedure including the risks, benefits and alternatives for the proposed anesthesia with the patient or authorized representative who has indicated his/her understanding and acceptance.     Dental advisory given  Plan Discussed with: CRNA  Anesthesia Plan Comments: (2nd PIV)     Anesthesia Quick  Evaluation

## 2019-04-16 ENCOUNTER — Inpatient Hospital Stay (HOSPITAL_COMMUNITY): Payer: PPO | Admitting: Anesthesiology

## 2019-04-16 ENCOUNTER — Inpatient Hospital Stay (HOSPITAL_COMMUNITY): Payer: PPO

## 2019-04-16 ENCOUNTER — Other Ambulatory Visit: Payer: Self-pay

## 2019-04-16 ENCOUNTER — Encounter (HOSPITAL_COMMUNITY): Payer: Self-pay | Admitting: *Deleted

## 2019-04-16 ENCOUNTER — Inpatient Hospital Stay (HOSPITAL_COMMUNITY)
Admission: RE | Admit: 2019-04-16 | Discharge: 2019-04-17 | DRG: 455 | Disposition: A | Discharge status: Home Health | Payer: PPO | Source: Ambulatory Visit | Attending: Neurosurgery | Admitting: Neurosurgery

## 2019-04-16 ENCOUNTER — Encounter (HOSPITAL_COMMUNITY)
Admission: RE | Disposition: A | Discharge status: Home Health | Payer: Self-pay | Source: Ambulatory Visit | Attending: Neurosurgery

## 2019-04-16 DIAGNOSIS — Z981 Arthrodesis status: Secondary | ICD-10-CM | POA: Diagnosis not present

## 2019-04-16 DIAGNOSIS — M48061 Spinal stenosis, lumbar region without neurogenic claudication: Secondary | ICD-10-CM | POA: Diagnosis present

## 2019-04-16 DIAGNOSIS — Z96698 Presence of other orthopedic joint implants: Secondary | ICD-10-CM | POA: Diagnosis present

## 2019-04-16 DIAGNOSIS — Z885 Allergy status to narcotic agent status: Secondary | ICD-10-CM

## 2019-04-16 DIAGNOSIS — M47816 Spondylosis without myelopathy or radiculopathy, lumbar region: Secondary | ICD-10-CM | POA: Diagnosis not present

## 2019-04-16 DIAGNOSIS — K219 Gastro-esophageal reflux disease without esophagitis: Secondary | ICD-10-CM | POA: Diagnosis not present

## 2019-04-16 DIAGNOSIS — F419 Anxiety disorder, unspecified: Secondary | ICD-10-CM | POA: Diagnosis present

## 2019-04-16 DIAGNOSIS — M549 Dorsalgia, unspecified: Secondary | ICD-10-CM | POA: Diagnosis present

## 2019-04-16 DIAGNOSIS — Z79890 Hormone replacement therapy (postmenopausal): Secondary | ICD-10-CM | POA: Diagnosis not present

## 2019-04-16 DIAGNOSIS — M170 Bilateral primary osteoarthritis of knee: Secondary | ICD-10-CM | POA: Diagnosis present

## 2019-04-16 DIAGNOSIS — Z87442 Personal history of urinary calculi: Secondary | ICD-10-CM | POA: Diagnosis not present

## 2019-04-16 DIAGNOSIS — Z87891 Personal history of nicotine dependence: Secondary | ICD-10-CM

## 2019-04-16 DIAGNOSIS — Z888 Allergy status to other drugs, medicaments and biological substances status: Secondary | ICD-10-CM | POA: Diagnosis not present

## 2019-04-16 DIAGNOSIS — M5116 Intervertebral disc disorders with radiculopathy, lumbar region: Secondary | ICD-10-CM | POA: Diagnosis not present

## 2019-04-16 DIAGNOSIS — R42 Dizziness and giddiness: Secondary | ICD-10-CM | POA: Diagnosis not present

## 2019-04-16 DIAGNOSIS — G8929 Other chronic pain: Secondary | ICD-10-CM | POA: Diagnosis not present

## 2019-04-16 DIAGNOSIS — M5136 Other intervertebral disc degeneration, lumbar region: Secondary | ICD-10-CM | POA: Diagnosis not present

## 2019-04-16 DIAGNOSIS — E890 Postprocedural hypothyroidism: Secondary | ICD-10-CM | POA: Diagnosis present

## 2019-04-16 DIAGNOSIS — M4726 Other spondylosis with radiculopathy, lumbar region: Secondary | ICD-10-CM | POA: Diagnosis not present

## 2019-04-16 DIAGNOSIS — Z79899 Other long term (current) drug therapy: Secondary | ICD-10-CM

## 2019-04-16 DIAGNOSIS — M532X6 Spinal instabilities, lumbar region: Secondary | ICD-10-CM | POA: Diagnosis not present

## 2019-04-16 DIAGNOSIS — M4326 Fusion of spine, lumbar region: Secondary | ICD-10-CM | POA: Diagnosis not present

## 2019-04-16 DIAGNOSIS — E039 Hypothyroidism, unspecified: Secondary | ICD-10-CM | POA: Diagnosis not present

## 2019-04-16 DIAGNOSIS — E785 Hyperlipidemia, unspecified: Secondary | ICD-10-CM | POA: Diagnosis present

## 2019-04-16 DIAGNOSIS — Z419 Encounter for procedure for purposes other than remedying health state, unspecified: Secondary | ICD-10-CM

## 2019-04-16 DIAGNOSIS — M79605 Pain in left leg: Secondary | ICD-10-CM | POA: Diagnosis present

## 2019-04-16 HISTORY — PX: ANTERIOR LAT LUMBAR FUSION: SHX1168

## 2019-04-16 HISTORY — PX: HARDWARE REMOVAL: SHX979

## 2019-04-16 LAB — CREATININE, SERUM
Creatinine, Ser: 0.84 mg/dL (ref 0.44–1.00)
GFR calc Af Amer: >60 mL/min (ref >60)
GFR calc non Af Amer: >60 mL/min (ref >60)

## 2019-04-16 SURGERY — ANTERIOR LATERAL LUMBAR FUSION 1 LEVEL
Anesthesia: General | Site: Spine Lumbar

## 2019-04-16 MED ORDER — VANCOMYCIN HCL IN DEXTROSE 1-5 GM/200ML-% IV SOLN
1000.0000 mg | INTRAVENOUS | Status: AC
  Administered 2019-04-16: 07:00:00 1000 mg via INTRAVENOUS
  Filled 2019-04-16: qty 200

## 2019-04-16 MED ORDER — ACETAMINOPHEN 500 MG PO TABS
1000.0000 mg | ORAL_TABLET | Freq: Once | ORAL | Status: AC
  Administered 2019-04-16: 1000 mg via ORAL

## 2019-04-16 MED ORDER — APPLE CIDER VINEGAR 500 MG PO TABS: ORAL_TABLET | Freq: Every day | ORAL | Status: DC

## 2019-04-16 MED ORDER — SUCCINYLCHOLINE CHLORIDE 200 MG/10ML IV SOSY
PREFILLED_SYRINGE | INTRAVENOUS | Status: AC
  Filled 2019-04-16: qty 10

## 2019-04-16 MED ORDER — CYCLOBENZAPRINE HCL 10 MG PO TABS
10.0000 mg | ORAL_TABLET | Freq: Three times a day (TID) | ORAL | Status: DC | PRN
  Administered 2019-04-16 – 2019-04-17 (×2): 10 mg via ORAL
  Filled 2019-04-16 (×2): qty 1

## 2019-04-16 MED ORDER — LIDOCAINE-EPINEPHRINE 1 %-1:100000 IJ SOLN
INTRAMUSCULAR | Status: DC | PRN
  Administered 2019-04-16: 10 mL

## 2019-04-16 MED ORDER — ONDANSETRON HCL 4 MG/2ML IJ SOLN: 4.0000 mg | Freq: Once | INTRAMUSCULAR | Status: DC | PRN

## 2019-04-16 MED ORDER — THROMBIN 5000 UNITS EX SOLR
CUTANEOUS | Status: DC | PRN
  Administered 2019-04-16 (×2): 5000 [IU] via TOPICAL

## 2019-04-16 MED ORDER — ALUM & MAG HYDROXIDE-SIMETH 200-200-20 MG/5ML PO SUSP: 30.0000 mL | Freq: Four times a day (QID) | ORAL | Status: DC | PRN

## 2019-04-16 MED ORDER — PHENYLEPHRINE 40 MCG/ML (10ML) SYRINGE FOR IV PUSH (FOR BLOOD PRESSURE SUPPORT)
PREFILLED_SYRINGE | INTRAVENOUS | Status: DC | PRN
  Administered 2019-04-16 (×2): 120 ug via INTRAVENOUS

## 2019-04-16 MED ORDER — SUGAMMADEX SODIUM 200 MG/2ML IV SOLN
INTRAVENOUS | Status: DC | PRN
  Administered 2019-04-16: 300 mg via INTRAVENOUS

## 2019-04-16 MED ORDER — THROMBIN 5000 UNITS EX SOLR
OROMUCOSAL | Status: DC | PRN
  Administered 2019-04-16: 09:00:00 via TOPICAL

## 2019-04-16 MED ORDER — CHLORHEXIDINE GLUCONATE CLOTH 2 % EX PADS: 6.0000 | MEDICATED_PAD | Freq: Once | CUTANEOUS | Status: DC

## 2019-04-16 MED ORDER — LEVOTHYROXINE SODIUM 75 MCG PO TABS: 75.0000 ug | ORAL_TABLET | ORAL | Status: DC

## 2019-04-16 MED ORDER — PROPOFOL 1000 MG/100ML IV EMUL
INTRAVENOUS | Status: AC
  Filled 2019-04-16: qty 200

## 2019-04-16 MED ORDER — ROCURONIUM BROMIDE 10 MG/ML (PF) SYRINGE
PREFILLED_SYRINGE | INTRAVENOUS | Status: AC
  Filled 2019-04-16: qty 10

## 2019-04-16 MED ORDER — PHENYLEPHRINE 40 MCG/ML (10ML) SYRINGE FOR IV PUSH (FOR BLOOD PRESSURE SUPPORT)
PREFILLED_SYRINGE | INTRAVENOUS | Status: AC
  Filled 2019-04-16: qty 10

## 2019-04-16 MED ORDER — ACYCLOVIR 400 MG PO TABS
400.0000 mg | ORAL_TABLET | Freq: Two times a day (BID) | ORAL | Status: DC | PRN
  Filled 2019-04-16: qty 1

## 2019-04-16 MED ORDER — PROPOFOL 10 MG/ML IV BOLUS
INTRAVENOUS | Status: AC
  Filled 2019-04-16: qty 20

## 2019-04-16 MED ORDER — CYCLOBENZAPRINE HCL 10 MG PO TABS
ORAL_TABLET | ORAL | Status: AC
  Filled 2019-04-16: qty 1

## 2019-04-16 MED ORDER — ONDANSETRON HCL 4 MG/2ML IJ SOLN
INTRAMUSCULAR | Status: DC | PRN
  Administered 2019-04-16: 4 mg via INTRAVENOUS

## 2019-04-16 MED ORDER — ONDANSETRON HCL 4 MG/2ML IJ SOLN
INTRAMUSCULAR | Status: AC
  Filled 2019-04-16: qty 2

## 2019-04-16 MED ORDER — ACETAMINOPHEN 325 MG PO TABS: 650.0000 mg | ORAL_TABLET | ORAL | Status: DC | PRN

## 2019-04-16 MED ORDER — SODIUM CHLORIDE 0.9 % IV SOLN
0.0125 ug/kg/min | INTRAVENOUS | Status: DC
  Administered 2019-04-16: .07 ug/kg/min via INTRAVENOUS
  Filled 2019-04-16: qty 2000

## 2019-04-16 MED ORDER — TIMOLOL MALEATE 0.5 % OP SOLN
1.0000 [drp] | Freq: Two times a day (BID) | OPHTHALMIC | Status: DC
  Administered 2019-04-16 – 2019-04-17 (×2): 1 [drp] via OPHTHALMIC
  Filled 2019-04-16: qty 5

## 2019-04-16 MED ORDER — PANTOPRAZOLE SODIUM 40 MG IV SOLR: 40.0000 mg | Freq: Every day | INTRAVENOUS | Status: DC

## 2019-04-16 MED ORDER — ONDANSETRON HCL 4 MG/2ML IJ SOLN: 4.0000 mg | Freq: Four times a day (QID) | INTRAMUSCULAR | Status: DC | PRN

## 2019-04-16 MED ORDER — PHENOL 1.4 % MT LIQD: 1.0000 | OROMUCOSAL | Status: DC | PRN

## 2019-04-16 MED ORDER — ACETAMINOPHEN 500 MG PO TABS
1000.0000 mg | ORAL_TABLET | Freq: Two times a day (BID) | ORAL | Status: DC
  Administered 2019-04-16 – 2019-04-17 (×2): 1000 mg via ORAL
  Filled 2019-04-16 (×2): qty 2

## 2019-04-16 MED ORDER — ACETAMINOPHEN 650 MG RE SUPP: 650.0000 mg | RECTAL | Status: DC | PRN

## 2019-04-16 MED ORDER — VANCOMYCIN HCL 10 G IV SOLR
1250.0000 mg | INTRAVENOUS | Status: DC
  Administered 2019-04-16: 1250 mg via INTRAVENOUS
  Filled 2019-04-16 (×2): qty 1250

## 2019-04-16 MED ORDER — MIDAZOLAM HCL 2 MG/2ML IJ SOLN
INTRAMUSCULAR | Status: AC
  Filled 2019-04-16: qty 2

## 2019-04-16 MED ORDER — 0.9 % SODIUM CHLORIDE (POUR BTL) OPTIME
TOPICAL | Status: DC | PRN
  Administered 2019-04-16: 1000 mL

## 2019-04-16 MED ORDER — SODIUM CHLORIDE 0.9 % IV SOLN
INTRAVENOUS | Status: DC | PRN
  Administered 2019-04-16: 09:00:00

## 2019-04-16 MED ORDER — MIDAZOLAM HCL 2 MG/2ML IJ SOLN
INTRAMUSCULAR | Status: DC | PRN
  Administered 2019-04-16: 2 mg via INTRAVENOUS

## 2019-04-16 MED ORDER — FENTANYL CITRATE (PF) 100 MCG/2ML IJ SOLN
25.0000 ug | INTRAMUSCULAR | Status: DC | PRN
  Administered 2019-04-16 (×2): 25 ug via INTRAVENOUS

## 2019-04-16 MED ORDER — MENTHOL 3 MG MT LOZG: 1.0000 | LOZENGE | OROMUCOSAL | Status: DC | PRN

## 2019-04-16 MED ORDER — FENTANYL CITRATE (PF) 100 MCG/2ML IJ SOLN
INTRAMUSCULAR | Status: AC
  Filled 2019-04-16: qty 2

## 2019-04-16 MED ORDER — BUPIVACAINE LIPOSOME 1.3 % IJ SUSP
INTRAMUSCULAR | Status: DC | PRN
  Administered 2019-04-16: 20 mL

## 2019-04-16 MED ORDER — PANTOPRAZOLE SODIUM 40 MG PO TBEC
40.0000 mg | DELAYED_RELEASE_TABLET | Freq: Every day | ORAL | Status: DC
  Administered 2019-04-16: 40 mg via ORAL
  Filled 2019-04-16: qty 1

## 2019-04-16 MED ORDER — HEMOSTATIC AGENTS (NO CHARGE) OPTIME
TOPICAL | Status: DC | PRN
  Administered 2019-04-16: 1 via TOPICAL

## 2019-04-16 MED ORDER — LACTATED RINGERS IV SOLN
INTRAVENOUS | Status: DC | PRN
  Administered 2019-04-16 (×2): via INTRAVENOUS

## 2019-04-16 MED ORDER — BUPIVACAINE LIPOSOME 1.3 % IJ SUSP
20.0000 mL | Freq: Once | INTRAMUSCULAR | Status: DC
  Filled 2019-04-16: qty 20

## 2019-04-16 MED ORDER — PROPOFOL 500 MG/50ML IV EMUL
INTRAVENOUS | Status: DC | PRN
  Administered 2019-04-16: 09:00:00 via INTRAVENOUS
  Administered 2019-04-16: 150 ug/kg/min via INTRAVENOUS

## 2019-04-16 MED ORDER — LACTATED RINGERS IV SOLN
INTRAVENOUS | Status: DC | PRN
  Administered 2019-04-16 (×2): via INTRAVENOUS

## 2019-04-16 MED ORDER — OXYBUTYNIN CHLORIDE ER 10 MG PO TB24
20.0000 mg | ORAL_TABLET | Freq: Two times a day (BID) | ORAL | Status: DC
  Administered 2019-04-16 – 2019-04-17 (×3): 20 mg via ORAL
  Filled 2019-04-16 (×4): qty 2

## 2019-04-16 MED ORDER — BUPIVACAINE HCL (PF) 0.25 % IJ SOLN
INTRAMUSCULAR | Status: AC
  Filled 2019-04-16: qty 30

## 2019-04-16 MED ORDER — SODIUM CHLORIDE 0.9% FLUSH: 3.0000 mL | INTRAVENOUS | Status: DC | PRN

## 2019-04-16 MED ORDER — PROPOFOL 10 MG/ML IV BOLUS
INTRAVENOUS | Status: DC | PRN
  Administered 2019-04-16: 50 mg via INTRAVENOUS

## 2019-04-16 MED ORDER — BIOTIN 10 MG PO TABS: 10.0000 mg | ORAL_TABLET | Freq: Every day | ORAL | Status: DC

## 2019-04-16 MED ORDER — LIDOCAINE 2% (20 MG/ML) 5 ML SYRINGE
INTRAMUSCULAR | Status: DC | PRN
  Administered 2019-04-16: 80 mg via INTRAVENOUS

## 2019-04-16 MED ORDER — OXYCODONE HCL 5 MG PO TABS
10.0000 mg | ORAL_TABLET | ORAL | Status: DC | PRN
  Administered 2019-04-16: 10 mg via ORAL
  Administered 2019-04-17: 5 mg via ORAL
  Filled 2019-04-16 (×2): qty 2

## 2019-04-16 MED ORDER — SODIUM CHLORIDE 0.9 % IV SOLN
INTRAVENOUS | Status: DC | PRN
  Administered 2019-04-16: 20 ug/min via INTRAVENOUS

## 2019-04-16 MED ORDER — PAROXETINE HCL 20 MG PO TABS
20.0000 mg | ORAL_TABLET | ORAL | Status: DC
  Filled 2019-04-16 (×2): qty 1

## 2019-04-16 MED ORDER — ONDANSETRON HCL 4 MG PO TABS: 4.0000 mg | ORAL_TABLET | Freq: Four times a day (QID) | ORAL | Status: DC | PRN

## 2019-04-16 MED ORDER — THROMBIN 5000 UNITS EX SOLR
CUTANEOUS | Status: AC
  Filled 2019-04-16: qty 15000

## 2019-04-16 MED ORDER — PANTOPRAZOLE SODIUM 40 MG PO TBEC: 40.0000 mg | DELAYED_RELEASE_TABLET | Freq: Every day | ORAL | Status: DC

## 2019-04-16 MED ORDER — HYDROMORPHONE HCL 1 MG/ML IJ SOLN: 0.5000 mg | INTRAMUSCULAR | Status: DC | PRN

## 2019-04-16 MED ORDER — FENTANYL CITRATE (PF) 250 MCG/5ML IJ SOLN
INTRAMUSCULAR | Status: AC
  Filled 2019-04-16: qty 5

## 2019-04-16 MED ORDER — SODIUM CHLORIDE 0.9% FLUSH
3.0000 mL | Freq: Two times a day (BID) | INTRAVENOUS | Status: DC
  Administered 2019-04-16 (×2): 3 mL via INTRAVENOUS

## 2019-04-16 MED ORDER — ACETAMINOPHEN 500 MG PO TABS
ORAL_TABLET | ORAL | Status: AC
  Filled 2019-04-16: qty 2

## 2019-04-16 MED ORDER — ROCURONIUM BROMIDE 50 MG/5ML IV SOSY
PREFILLED_SYRINGE | INTRAVENOUS | Status: DC | PRN
  Administered 2019-04-16: 50 mg via INTRAVENOUS
  Administered 2019-04-16 (×3): 10 mg via INTRAVENOUS

## 2019-04-16 MED ORDER — DEXAMETHASONE SODIUM PHOSPHATE 10 MG/ML IJ SOLN
10.0000 mg | Freq: Once | INTRAMUSCULAR | Status: AC
  Administered 2019-04-16: 08:00:00 10 mg via INTRAVENOUS
  Filled 2019-04-16: qty 1

## 2019-04-16 MED ORDER — SUCCINYLCHOLINE CHLORIDE 200 MG/10ML IV SOSY
PREFILLED_SYRINGE | INTRAVENOUS | Status: DC | PRN
  Administered 2019-04-16: 100 mg via INTRAVENOUS

## 2019-04-16 MED ORDER — PRAVASTATIN SODIUM 10 MG PO TABS
20.0000 mg | ORAL_TABLET | Freq: Every day | ORAL | Status: DC
  Administered 2019-04-16: 20 mg via ORAL
  Filled 2019-04-16: qty 2

## 2019-04-16 MED ORDER — LIDOCAINE 2% (20 MG/ML) 5 ML SYRINGE
INTRAMUSCULAR | Status: AC
  Filled 2019-04-16: qty 5

## 2019-04-16 MED ORDER — FENTANYL CITRATE (PF) 100 MCG/2ML IJ SOLN
INTRAMUSCULAR | Status: DC | PRN
  Administered 2019-04-16: 50 ug via INTRAVENOUS
  Administered 2019-04-16: 150 ug via INTRAVENOUS
  Administered 2019-04-16 (×2): 50 ug via INTRAVENOUS

## 2019-04-16 MED ORDER — SODIUM CHLORIDE 0.9 % IV SOLN: 250.0000 mL | INTRAVENOUS | Status: DC

## 2019-04-16 MED ORDER — ESTRADIOL 1 MG PO TABS
0.5000 mg | ORAL_TABLET | Freq: Every day | ORAL | Status: DC
  Filled 2019-04-16: qty 0.5

## 2019-04-16 MED ORDER — LEVOTHYROXINE SODIUM 100 MCG PO TABS
100.0000 ug | ORAL_TABLET | ORAL | Status: DC
  Administered 2019-04-17: 100 ug via ORAL
  Filled 2019-04-16: qty 1

## 2019-04-16 MED ORDER — LIDOCAINE-EPINEPHRINE 1 %-1:100000 IJ SOLN
INTRAMUSCULAR | Status: AC
  Filled 2019-04-16: qty 1

## 2019-04-16 SURGICAL SUPPLY — 76 items
BAG DECANTER FOR FLEXI CONT (MISCELLANEOUS) ×3 IMPLANT
BATTALION LLIF ITRADISCAL SHIM (MISCELLANEOUS) ×3
BENZOIN TINCTURE PRP APPL 2/3 (GAUZE/BANDAGES/DRESSINGS) ×6 IMPLANT
BIT DRILL 5.0/4.0 (BIT) ×1 IMPLANT
BONE MATRIX OSTEOCEL PRO MED (Bone Implant) ×6 IMPLANT
BUR MATCHSTICK NEURO 3.0 LAGG (BURR) ×3 IMPLANT
BUR PRECISION FLUTE 6.0 (BURR) IMPLANT
Bayonet Knurled Srandard ×3 IMPLANT
CANISTER SUCT 3000ML PPV (MISCELLANEOUS) ×6 IMPLANT
CAP LOCKING (Cap) ×4 IMPLANT
CAP LOCKING 5.5 CREO (Cap) ×2 IMPLANT
CAP LOCKING REVERE (Cap) ×6 IMPLANT
CARTRIDGE OIL MAESTRO DRILL (MISCELLANEOUS) ×1 IMPLANT
CLIP SPRING STIM LLIF SAFEOP (CLIP) ×3 IMPLANT
CLOSURE WOUND 1/2 X4 (GAUZE/BANDAGES/DRESSINGS) ×2
DERMABOND ADVANCED (GAUZE/BANDAGES/DRESSINGS) ×6
DERMABOND ADVANCED .7 DNX12 (GAUZE/BANDAGES/DRESSINGS) ×3 IMPLANT
DIFFUSER DRILL AIR PNEUMATIC (MISCELLANEOUS) ×3 IMPLANT
DILATOR INSULATED LLIF 8-13-18 (NEUROSURGERY SUPPLIES) ×3 IMPLANT
DRAPE C-ARM 42X72 X-RAY (DRAPES) ×3 IMPLANT
DRAPE C-ARMOR (DRAPES) ×6 IMPLANT
DRAPE HALF SHEET 40X57 (DRAPES) ×6 IMPLANT
DRAPE LAPAROTOMY 100X72X124 (DRAPES) ×6 IMPLANT
DRAPE SURG 17X23 STRL (DRAPES) ×9 IMPLANT
DRILL 5.0/4.0 (BIT) ×3
DRSG OPSITE 4X5.5 SM (GAUZE/BANDAGES/DRESSINGS) ×3 IMPLANT
DRSG OPSITE POSTOP 3X4 (GAUZE/BANDAGES/DRESSINGS) ×6 IMPLANT
DRSG OPSITE POSTOP 4X10 (GAUZE/BANDAGES/DRESSINGS) ×3 IMPLANT
ELECT KIT SAFEOP EMG/NMJ NDL (KITS) ×3
ELECT REM PT RETURN 9FT ADLT (ELECTROSURGICAL) ×6
ELECTRODE KT SAFOP EMG/NMJ NDL (KITS) ×1 IMPLANT
ELECTRODE REM PT RTRN 9FT ADLT (ELECTROSURGICAL) ×2 IMPLANT
EVACUATOR 1/8 PVC DRAIN (DRAIN) ×3 IMPLANT
GAUZE 4X4 16PLY RFD (DISPOSABLE) IMPLANT
GAUZE SPONGE 4X4 12PLY STRL (GAUZE/BANDAGES/DRESSINGS) ×3 IMPLANT
GLOVE BIO SURGEON STRL SZ7 (GLOVE) ×6 IMPLANT
GLOVE BIO SURGEON STRL SZ8 (GLOVE) ×6 IMPLANT
GLOVE BIOGEL PI IND STRL 7.0 (GLOVE) ×2 IMPLANT
GLOVE BIOGEL PI INDICATOR 7.0 (GLOVE) ×4
GLOVE INDICATOR 8.5 STRL (GLOVE) ×6 IMPLANT
GOWN STRL REUS W/ TWL LRG LVL3 (GOWN DISPOSABLE) ×3 IMPLANT
GOWN STRL REUS W/ TWL XL LVL3 (GOWN DISPOSABLE) ×3 IMPLANT
GOWN STRL REUS W/TWL 2XL LVL3 (GOWN DISPOSABLE) IMPLANT
GOWN STRL REUS W/TWL LRG LVL3 (GOWN DISPOSABLE) ×6
GOWN STRL REUS W/TWL XL LVL3 (GOWN DISPOSABLE) ×6
HEMOSTAT POWDER KIT SURGIFOAM (HEMOSTASIS) ×3 IMPLANT
K-WIRE TROCAR TIP 320 (WIRE) ×3
KIT BASIN OR (CUSTOM PROCEDURE TRAY) ×6 IMPLANT
KIT INFUSE SMALL (Orthopedic Implant) ×3 IMPLANT
KIT TURNOVER KIT B (KITS) ×6 IMPLANT
KNIFE ANNULOTOMY (BLADE) ×3 IMPLANT
KWIRE TROCAR TIP 320 (WIRE) ×1 IMPLANT
LIF ILLUMINATION SYSTEM STERIL (SYSTAGENIX WOUND MANAGEMENT) ×3
NEEDLE HYPO 25X1 1.5 SAFETY (NEEDLE) ×6 IMPLANT
NEEDLE SPNL 22GX3.5 QUINCKE BK (NEEDLE) ×3 IMPLANT
NS IRRIG 1000ML POUR BTL (IV SOLUTION) ×6 IMPLANT
OIL CARTRIDGE MAESTRO DRILL (MISCELLANEOUS) ×3
PACK LAMINECTOMY NEURO (CUSTOM PROCEDURE TRAY) ×6 IMPLANT
PROBE BALL TIP LLIF SAFEOP (NEUROSURGERY SUPPLIES) ×3 IMPLANT
ROD 40MM SPINAL (Rod) ×3 IMPLANT
ROD CREO 45MM SPINAL (Rod) ×3 IMPLANT
SHAFT CREO 30MM (Neuro Prosthesis/Implant) ×6 IMPLANT
SHIM ITRADISCAL BATTALION LLIF (MISCELLANEOUS) ×1 IMPLANT
SPACER IDENTITI 8X22X45 0D (Spacer) ×3 IMPLANT
SPONGE SURGIFOAM ABS GEL SZ50 (HEMOSTASIS) ×3 IMPLANT
STRIP CLOSURE SKIN 1/2X4 (GAUZE/BANDAGES/DRESSINGS) ×4 IMPLANT
SUT VIC AB 0 CT1 18XCR BRD8 (SUTURE) ×2 IMPLANT
SUT VIC AB 0 CT1 8-18 (SUTURE) ×4
SUT VIC AB 2-0 CT1 18 (SUTURE) ×6 IMPLANT
SUT VICRYL 4-0 PS2 18IN ABS (SUTURE) ×9 IMPLANT
SYSTEM ILLUMINATION LIF STERIL (SYSTAGENIX WOUND MANAGEMENT) ×1 IMPLANT
TOWEL GREEN STERILE (TOWEL DISPOSABLE) ×3 IMPLANT
TOWEL GREEN STERILE FF (TOWEL DISPOSABLE) ×3 IMPLANT
TRAY FOLEY MTR SLVR 16FR STAT (SET/KITS/TRAYS/PACK) ×3 IMPLANT
TULIP CREP AMP 5.5MM (Orthopedic Implant) ×6 IMPLANT
WATER STERILE IRR 1000ML POUR (IV SOLUTION) ×3 IMPLANT

## 2019-04-16 NOTE — Anesthesia Postprocedure Evaluation (Signed)
Anesthesia Post Note  Patient: ADDALEIGH NICHOLLS  Procedure(s) Performed: LUMBAR TWO-THREE ANTERIOR LATERAL INTERBDOY FUSION (N/A Spine Lumbar) EXTENSION OF FUSION TO LUMBAR TWO -THREE  WITH REMOVAL OF HARDWARE AT LUMBAR FIVE-SACRAL ONE (N/A Spine Lumbar)     Patient location during evaluation: PACU Anesthesia Type: General Level of consciousness: awake and alert Pain management: pain level controlled Vital Signs Assessment: post-procedure vital signs reviewed and stable Respiratory status: spontaneous breathing, nonlabored ventilation and respiratory function stable Cardiovascular status: blood pressure returned to baseline and stable Postop Assessment: no apparent nausea or vomiting Anesthetic complications: no    Last Vitals:  Vitals:   04/16/19 1430 04/16/19 1516  BP: (!) 149/80 (!) 139/93  Pulse: 89 87  Resp: 20 17  Temp: (!) 36.2 C 36.6 C  SpO2: 93%     Last Pain:  Vitals:   04/16/19 1516  TempSrc: Oral  PainSc:                  Catalina Gravel

## 2019-04-16 NOTE — H&P (Signed)
Shannon Gutierrez is an 73 y.o. female.   Chief Complaint: Back and right greater than left leg pain HPI: 73 year old female with longstanding back pain previously undergone L3-S1 fusion.  Presents now with progressive worsening back and right L3 radicular symptoms refractory to all forms of conservative treatment work.  Work-up reveals severe degenerative collapse at L2-3 with spondylosis retrolisthesis and spinal stenosis.  Due to patient's progression of clinical syndrome imaging findings and failed conservative treatment I recommended anterior lateral interbody fusion at L2-3 with posterior augmentation with cortical screws removal of hardware.  I have extensively gone over the risks and benefits of the procedure with her as well as perioperative course expectations of outcome and alternatives of surgery and she understood and agreed to proceed forward.  Past Medical History:  Diagnosis Date  . Anxiety    takes Paxil daily  . Arthritis    osteoarthritis in knees and back  . Chronic back pain    scoliosis and stenosis and herniated disc  . Degeneration of lumbar intervertebral disc   . Diverticulosis   . GERD (gastroesophageal reflux disease)    takes Nexium daily  . Headache(784.0)    occasionally  . History of bronchitis    last time 5yrs ago  . History of kidney stones   . Hyperlipidemia    takes Pravastatin nightly  . Hypothyroidism    takes SYnthroid daily  . Joint pain   . Joint swelling   . Overactive bladder   . PONV (postoperative nausea and vomiting)   . Urinary urgency   . Weakness    and tingling in right foot    Past Surgical History:  Procedure Laterality Date  . ABDOMINAL HYSTERECTOMY    . BACK SURGERY  2013   fusion  . BREAST SURGERY  1980   augmentation  . CARPAL TUNNEL RELEASE Left    2010 and then right in 2012  . COLONOSCOPY    . ESOPHAGOGASTRODUODENOSCOPY    . EYE SURGERY    . HAND SURGERY     CMP joint  . JOINT REPLACEMENT     both hands  .  TONSILLECTOMY  1980  . TOTAL THYROIDECTOMY  1981    History reviewed. No pertinent family history. Social History:  reports that she has quit smoking. Her smoking use included cigarettes. She has a 10.00 pack-year smoking history. She has never used smokeless tobacco. She reports that she does not drink alcohol or use drugs.  Allergies:  Allergies  Allergen Reactions  . Keflex [Cephalexin] Swelling and Rash    Facial swelling and rash  . Metaxalone Hives and Rash  . Morphine And Related Nausea And Vomiting    Medications Prior to Admission  Medication Sig Dispense Refill  . acetaminophen (TYLENOL) 500 MG tablet Take 1,000 mg by mouth 2 (two) times daily.    . APPLE CIDER VINEGAR PO Take 2 tablets by mouth daily.    . Biotin 10 MG TABS Take 10 mg by mouth daily.    Marland Kitchen estradiol (ESTRACE) 0.5 MG tablet Take 0.5 mg by mouth daily.    Marland Kitchen levothyroxine (SYNTHROID) 100 MCG tablet Take 100 mcg by mouth See admin instructions. Take 100 mcg by mouth before breakfast Monday - Friday    . levothyroxine (SYNTHROID) 75 MCG tablet Take 75 mcg by mouth See admin instructions. Take 75 mcg by mouth before breakfast on Saturday and Sunday    . omeprazole (PRILOSEC) 20 MG capsule Take 20 mg by mouth daily.    Marland Kitchen  oxybutynin (DITROPAN-XL) 10 MG 24 hr tablet Take 20 mg by mouth 2 (two) times daily. Taking differently: 20mg  BID x1 month per MD    . PARoxetine (PAXIL) 20 MG tablet Take 20 mg by mouth every morning.    . pravastatin (PRAVACHOL) 20 MG tablet Take 20 mg by mouth at bedtime.     . timolol (TIMOPTIC) 0.5 % ophthalmic solution Place 1 drop into both eyes 2 (two) times daily.    acyclovir (ZOVIRAX) 400 MG tablet Take 400 mg by mouth 2 (two) times daily as needed (cold sore).     . cyclobenzaprine (FLEXERIL) 10 MG tablet Take 1 tablet (10 mg total) by mouth 3 (three) times daily as needed for muscle spasms. (Patient not taking: Reported on 04/10/2019) 30 tablet 3  . oxyCODONE-acetaminophen  (PERCOCET/ROXICET) 5-325 MG per tablet Take 1-2 tablets by mouth every 4 (four) hours as needed for moderate pain. (Patient not taking: Reported on 04/10/2019) 60 tablet 0    No results found for this or any previous visit (from the past 48 hour(s)). No results found.  Review of Systems  Musculoskeletal: Positive for back pain.  Neurological: Positive for sensory change.    Blood pressure (!) 161/52, pulse 72, resp. rate 18, height 5' 3.5" (1.613 m), weight 76.2 kg, SpO2 99 %. Physical Exam  Constitutional: She is oriented to person, place, and time. She appears well-developed and well-nourished.  HENT:  Head: Normocephalic.  Eyes: Pupils are equal, round, and reactive to light.  Neck: Normal range of motion.  Respiratory: Effort normal.  GI: Soft.  Neurological: She is alert and oriented to person, place, and time. She has normal strength. GCS eye subscore is 4. GCS verbal subscore is 5. GCS motor subscore is 6.  Skin: Skin is warm and dry.     Assessment/Plan 73 year old presents for anterior lateral interbody fusion L2-3 with posterior augmentation with cortical screws.  Deidre Carino P, MD 04/16/2019, 7:22 AM

## 2019-04-16 NOTE — Transfer of Care (Signed)
Immediate Anesthesia Transfer of Care Note  Patient: Shannon Gutierrez  Procedure(s) Performed: LUMBAR TWO-THREE ANTERIOR LATERAL INTERBDOY FUSION (N/A Spine Lumbar) EXTENSION OF FUSION TO LUMBAR TWO -THREE  WITH REMOVAL OF HARDWARE AT LUMBAR FIVE-SACRAL ONE (N/A Spine Lumbar)  Patient Location: PACU  Anesthesia Type:General  Level of Consciousness: sedated  Airway & Oxygen Therapy: Patient Spontanous Breathing and Patient connected to nasal cannula oxygen  Post-op Assessment: Report given to RN and Post -op Vital signs reviewed and stable  Post vital signs: Reviewed and stable  Last Vitals:  Vitals Value Taken Time  BP 162/75 04/16/19 1200  Temp 36.1 C 04/16/19 1200  Pulse 73 04/16/19 1209  Resp 14 04/16/19 1209  SpO2 100 % 04/16/19 1209  Vitals shown include unvalidated device data.  Last Pain:  Vitals:   04/16/19 1200  PainSc: Asleep      Patients Stated Pain Goal: 3 (93/26/71 2458)  Complications: No apparent anesthesia complications

## 2019-04-16 NOTE — Anesthesia Procedure Notes (Signed)
Procedure Name: Intubation Date/Time: 04/16/2019 7:51 AM Performed by: Leonor Liv, CRNA Pre-anesthesia Checklist: Patient identified, Emergency Drugs available, Suction available and Patient being monitored Patient Re-evaluated:Patient Re-evaluated prior to induction Oxygen Delivery Method: Circle System Utilized Preoxygenation: Pre-oxygenation with 100% oxygen Induction Type: IV induction Ventilation: Mask ventilation without difficulty Laryngoscope Size: Mac and 4 Grade View: Grade II Tube type: Oral Tube size: 7.0 mm Number of attempts: 1 Airway Equipment and Method: Stylet and Oral airway Placement Confirmation: ETT inserted through vocal cords under direct vision,  positive ETCO2 and breath sounds checked- equal and bilateral Secured at: 22 cm Tube secured with: Tape Dental Injury: Teeth and Oropharynx as per pre-operative assessment

## 2019-04-16 NOTE — Progress Notes (Signed)
Pharmacy Antibiotic Note  Shannon Gutierrez is a 73 y.o. female with longstanding back pain (previous L3-S1 fusion) admitted on 04/16/2019 with back and right greater than left leg pain (degenerative disc disease lumbar spondylosis L2-3 with segmental instability L2-3).    Today, pt is S/P the following procedures:  1. anterior lateral interbody fusion L2-3 utilizing the Alphatec titanium cage packed with Osteocel Pro and BMP with intraoperative neuro monitoring; 2.  Posterior segmental fixation L2-3 with placement of bilateral cortical screws tying into previously existing L3 globus Revere pedicle screws and exploration of fusion removal of hardware L4-S1; and  3.  Posterior lateral arthrodesis L2-3 utilizing osteocel pro and BMP  Pt has a hx of swelling, rash with cephalexin and rec'd vancomycin 1 gm pre-op. Pharmacy has been consulted for vancomycin dosing for surgical prophylaxis (per op note, drain in place).  Scr 0.84; CrCl; 58.1 ml/min  Plan: Vancomycin 1250 mg IV Q 24 hrs (estimated vancomycin AUC with this regimen, using Scr 0.84, is 499.3; goal vancomycin AUC is 400-550) Monitor renal function, WBC Monitor for removal of drain  Height: 5' 3.5" (161.3 cm) Weight: 168 lb (76.2 kg) IBW/kg (Calculated) : 53.55  Temp (24hrs), Avg:97.4 F (36.3 C), Min:97 F (36.1 C), Max:97.9 F (36.6 C)  Recent Labs  Lab 04/12/19 1135 04/16/19 1643  WBC 8.7  --   CREATININE  --  0.84    Estimated Creatinine Clearance: 58.1 mL/min (by C-G formula based on SCr of 0.84 mg/dL).    Allergies  Allergen Reactions  . Keflex [Cephalexin] Swelling and Rash    Facial swelling and rash  . Metaxalone Hives and Rash  . Morphine And Related Nausea And Vomiting    Microbiology results: 10/8 COVID: negative 10/8  MRSA PCR: negative  Thank you for allowing pharmacy to be a part of this patient's care.  Gillermina Hu, PharmD, BCPS, [facility] Clinical Pharmacist 04/16/2019 6:54 PM

## 2019-04-16 NOTE — Progress Notes (Signed)
PHARMACIST - PHYSICIAN ORDER COMMUNICATION  CONCERNING: P&T Medication Policy on Herbal Medications  DESCRIPTION:  This patient's orders for:  Apple Cider Vinegar Tablet, Biotin have been noted.  These products are  classified as "herbal" or natural products. Due to a lack of definitive safety studies or FDA approval, nonstandard manufacturing practices, plus the potential risk of unknown drug-drug interactions while on inpatient medications, the Pharmacy and Therapeutics Committee does not permit the use of "herbal" or natural products of this type within [facility].   ACTION TAKEN: The pharmacy department is unable to verify these orders at this time and your patient has been informed of this safety policy. Please reevaluate patient's clinical condition at discharge and address if the herbal or natural product(s) should be resumed at that time.   Gillermina Hu, PharmD, BCPS, [facility] Clinical Pharmacist

## 2019-04-16 NOTE — Op Note (Signed)
Preoperative diagnosis: Degenerative disc disease lumbar spondylosis L2-3 with segmental instability L2-3  Postoperative diagnosis: Same  Procedure: #1 anterior lateral interbody fusion L2-3 utilizing the Alphatec titanium cage packed with Osteocel Pro and BMP with intraoperative neuro monitoring  2.  Posterior segmental fixation L2-3 with placement of bilateral cortical screws tying into previously existing L3 globus Revere pedicle screws and exploration of fusion removal of hardware L4-S1  3.  Posterior lateral arthrodesis L2-3 utilizing osteocel pro and BMP  Surgeon: Jillyn Hidden Matalynn Graff  Assistant: Altamease Oiler  Anesthesia: General  EBL: Minimal  HPI: 73 year old female with progressive worsening back and bilateral hip and leg pain worse on the right work-up revealed progressive degenerative collapse L2-3 with instability and Modic changes.  This was above the level of her previous L3-S1 fusion.  Due to patient progression of clinical syndrome imaging findings and failed conservative treatment I recommended anterior lateral interbody fusion with posterior augmentation and removal of hardware I extensively went over the risks and benefits of the operation with her as well as perioperative course expectations of outcome and alternatives of surgery and she understood and agreed to proceed forward.  Operative procedure: Patient was brought into the OR was used and general anesthesia for the first part of the procedure patient was positioned left side up right-side-down and utilizing fluoroscopy identified the L2-3 disc base in both lateral AP dimension patient was taped in place lower the back and flex the bed a little bit to gain access underneath the 12th rib.  Then after we confirmed entry points both to entry points 1 laterally over the L2-3 disc base as well as 1 posteriorly to gain access to the retroperitoneal space her left side was shaved prepped and draped in routine sterile fashion.  Both incision  was then opened up and then utilizing the 2 incision technique with my finger palpating the backside of the of the transverse process sweeping up and creating the retroperitoneal space I passed the dilator of my finger into the L2-3 disc base confirmed by fluoroscopy.  Then stimulated in a 3 and 6 degree orientation and anchored the Steinmann pin then placed the retractor and locked the retractor in place again under fluoroscopy confirming good positioning once a identified a safe zone with the ball-tipped probe I anchored the shim in place under fluoroscopy.  I then incised the disc base and proceeded to clean out the disc base and performed the contralateral release with Cobbs and dilators.  I selected a 8 mm tall parallel to 2 mm length titanium cage packed with the ostia cell pro and BMP and placed this under fluoroscopy.  Both AP and lateral confirmed the implant to be in good position.  Then the wound was copes irrigated to Kassim states was maintained both incisions were then closed with interrupted Vicryl in a running 4 subcuticular.  Patient was then repositioned.  Patient was positioned prone Wilson frame her old incision was opened up and extended cephalad scar tissue was dissected free and I exposed the hardware from L3-S1 and identified the cortical screw entry point at L2.  I then disconnected the hardware the fusion did appear to be solid and remove the L4-5 and S1 screws.  Then cortical screw entry hole was identified under fluoroscopy and drilled probed tapped probed and 6 oh by 5 oh by 30 mm cortical screws were placed all screws with excellent purchase.  Wounds and copiously irrigated to Kassim states was maintained the heads were assembled there is screws were  advanced and then I placed a 40 mm rod in the left and 45 mm rod in the right anchored everything in place tightened down torqued down the L3 connector compressed the L2 screw against L3 and anchored that went in place.  Then I  decorticated the facet joints lateral pars at 1 2 and 2 3 and placed ostia cell pro and BMP along the facet joints lateral pars.  Then a drain was placed Exparel was injected in the wound was closed in layers with Vicryl skin was closed running 4 subcuticular Dermabond benzoin Steri-Strips and a sterile dressing was applied patient recovery in stable condition.  At the end the case all needle count sponge counts were correct.

## 2019-04-17 MED ORDER — HYDROCODONE-ACETAMINOPHEN 5-325 MG PO TABS
1.0000 | ORAL_TABLET | ORAL | Status: DC | PRN
  Administered 2019-04-17: 1 via ORAL
  Filled 2019-04-17: qty 1

## 2019-04-17 MED ORDER — OXYCODONE HCL 5 MG PO TABS: 5.0000 mg | ORAL_TABLET | ORAL | Status: DC | PRN

## 2019-04-17 MED ORDER — CYCLOBENZAPRINE HCL 10 MG PO TABS: 10.0000 mg | ORAL_TABLET | Freq: Three times a day (TID) | ORAL | 2 refills | Status: AC | PRN

## 2019-04-17 MED ORDER — SODIUM CHLORIDE 0.9 % IV BOLUS
500.0000 mL | Freq: Once | INTRAVENOUS | Status: AC
  Administered 2019-04-17: 500 mL via INTRAVENOUS

## 2019-04-17 MED ORDER — OXYCODONE-ACETAMINOPHEN 7.5-325 MG PO TABS: 1.0000 | ORAL_TABLET | ORAL | 0 refills | Status: AC | PRN

## 2019-04-17 NOTE — TOC Initial Note (Signed)
Transition of Care [facility]) - Initial/Assessment Note    Patient Details  Name: Shannon Gutierrez MRN: [id number] Date of Birth: 10/26/1945  Transition of Care [facility]) CM/SW Contact:    Shannon Friar, RN Phone Number: 04/17/2019, 12:18 PM  Clinical Narrative:                 Pt to d/c home with [facility] services when medically ready. CM spoke with patients daughter since pt is a little confused. [name] provided choice and [facility] selected. Shannon Gutierrez with [facility] accepted the referral.  Pt going to daughters home: [address] in [place] Phone: [phone]. Family to provide transport when d/ced.   Expected Discharge Plan: [place] Barriers to Discharge: Continued Medical Work up   Patient Goals and CMS Choice   CMS Medicare.gov Compare Post Acute Care list provided to:: Patient Represenative (must comment) Choice offered to / list presented to : Adult Children  Expected Discharge Plan and Services Expected Discharge Plan: [place]   Discharge Planning Services: CM Consult Post Acute Care Choice: Home Health                             HH Arranged: PT, OT, Nurse's Aide [facility] Agency: [facility] Date [facility] Agency Contacted: 04/17/19   Representative spoke with at [place]: Shannon Gutierrez  Prior Living Arrangements/Services   Lives with:: Self Patient language and need for interpreter reviewed:: Yes(no needs) Do you feel safe going back to the place where you live?: Yes      Need for Family Participation in Patient Care: Yes (Comment) Care giver support system in place?: Yes (comment)(going to daughters home)   Criminal Activity/Legal Involvement Pertinent to Current Situation/Hospitalization: No - Comment as needed  Activities of Daily Living Home Assistive Devices/Equipment: Eyeglasses, Cane (specify quad or straight), Walker (specify type) ADL Screening (condition at time of admission) Patient's cognitive ability adequate  to safely complete daily activities?: Yes Is the patient deaf or have difficulty hearing?: No Does the patient have difficulty seeing, even when wearing glasses/contacts?: No Does the patient have difficulty concentrating, remembering, or making decisions?: No Patient able to express need for assistance with ADLs?: Yes Does the patient have difficulty dressing or bathing?: No Independently performs ADLs?: Yes (appropriate for developmental age) Does the patient have difficulty walking or climbing stairs?: No Weakness of Legs: Right Weakness of Arms/Hands: None  Permission Sought/Granted                  Emotional Assessment Appearance:: Appears stated age     Orientation: : Oriented to Self   Psych Involvement: No (comment)  Admission diagnosis:  Spondylolisthesis Patient Active Problem List   Diagnosis Date Noted  . DDD (degenerative disc disease), lumbar 04/16/2019  . Spinal stenosis of lumbar region 10/03/2013  . Radiculopathy of lumbar region 04/20/2012   PCP:  Shannon Gutierrez., MD Pharmacy:   [place], [place] - [address] [place] [address] Phone: [phone] Fax: [place], [place] - [address] [place] [address] Phone: [phone] Fax: [phone]     Social Determinants of Health (SDOH) Interventions    Readmission Risk Interventions No flowsheet data found.

## 2019-04-17 NOTE — Discharge Summary (Signed)
Physician Discharge Summary  Patient ID: Shannon Gutierrez MRN: [id number] DOB/AGE: 1946-06-28 73 y.o.  Admit date: 04/16/2019 Discharge date: 04/17/2019  Admission Diagnoses:  Lumbar DDD  Discharge Diagnoses:  Same Active Problems:   DDD (degenerative disc disease), lumbar  Discharged Condition: Stable  Hospital Course:  Shannon Gutierrez is a 73 y.o. female who was admitted for the below procedure. Post op complicated by transient dizziness, resolved with fluids. At time of discharge, pain was well controlled, ambulating with Pt/OT, tolerating po, voiding normal. Ready for discharge.  Treatments: Surgery Procedure: #1 anterior lateral interbody fusion L2-3 utilizing the Alphatec titanium cage packed with Osteocel Pro and BMP with intraoperative neuro monitoring  2.  Posterior segmental fixation L2-3 with placement of bilateral cortical screws tying into previously existing L3 globus Revere pedicle screws and exploration of fusion removal of hardware L4-S1  3.  Posterior lateral arthrodesis L2-3 utilizing osteocel pro and BMP  Discharge Exam: Blood pressure 115/73, pulse 75, temperature 98.1 F (36.7 C), temperature source Oral, resp. rate 16, height 5' 3.5" (1.613 m), weight 76.2 kg, SpO2 95 %. Awake, alert, oriented Speech fluent, appropriate CN grossly intact 5/5 BUE/BLE Wound c/d/i  Disposition: Discharge disposition: 01-Home or Self Care       Discharge Instructions    Call MD for:  difficulty breathing, headache or visual disturbances   Complete by: As directed    Call MD for:  persistant dizziness or light-headedness   Complete by: As directed    Call MD for:  redness, tenderness, or signs of infection (pain, swelling, redness, odor or green/yellow discharge around incision site)   Complete by: As directed    Call MD for:  severe uncontrolled pain   Complete by: As directed    Call MD for:  temperature >100.4   Complete by: As directed    Diet general    Complete by: As directed    Driving Restrictions   Complete by: As directed    Do not drive until given clearance.   Increase activity slowly   Complete by: As directed    Lifting restrictions   Complete by: As directed    Do not lift anything >10lbs. Avoid bending and twisting in awkward positions. Avoid bending at the back.   May shower / Bathe   Complete by: As directed    In 24 hours. Okay to wash wound with warm soapy water. Avoid scrubbing the wound. Pat dry.   Remove dressing in 24 hours   Complete by: As directed      Allergies as of 04/17/2019      Reactions   Keflex [cephalexin] Swelling, Rash   Facial swelling and rash   Metaxalone Hives, Rash   Morphine And Related Nausea And Vomiting      Medication List    STOP taking these medications   oxyCODONE-acetaminophen 5-325 MG tablet Commonly known as: PERCOCET/ROXICET Replaced by: oxyCODONE-acetaminophen 7.5-325 MG tablet     TAKE these medications   acetaminophen 500 MG tablet Commonly known as: TYLENOL Take 1,000 mg by mouth 2 (two) times daily.   acyclovir 400 MG tablet Commonly known as: ZOVIRAX Take 400 mg by mouth 2 (two) times daily as needed (cold sore).   APPLE CIDER VINEGAR PO Take 2 tablets by mouth daily.   Biotin 10 MG Tabs Take 10 mg by mouth daily.   cyclobenzaprine 10 MG tablet Commonly known as: FLEXERIL Take 1 tablet (10 mg total) by mouth 3 (three) times daily as needed  for muscle spasms.   estradiol 0.5 MG tablet Commonly known as: ESTRACE Take 0.5 mg by mouth daily.   levothyroxine 100 MCG tablet Commonly known as: SYNTHROID Take 100 mcg by mouth See admin instructions. Take 100 mcg by mouth before breakfast Monday - Friday   levothyroxine 75 MCG tablet Commonly known as: SYNTHROID Take 75 mcg by mouth See admin instructions. Take 75 mcg by mouth before breakfast on Saturday and Sunday   omeprazole 20 MG capsule Commonly known as: PRILOSEC Take 20 mg by mouth daily.    oxybutynin 10 MG 24 hr tablet Commonly known as: DITROPAN-XL Take 20 mg by mouth 2 (two) times daily. Taking differently: 20mg  BID x1 month per MD   oxyCODONE-acetaminophen 7.5-325 MG tablet Commonly known as: Percocet Take 1 tablet by mouth every 4 (four) hours as needed for severe pain. Replaces: oxyCODONE-acetaminophen 5-325 MG tablet   PARoxetine 20 MG tablet Commonly known as: PAXIL Take 20 mg by mouth every morning.   pravastatin 20 MG tablet Commonly known as: PRAVACHOL Take 20 mg by mouth at bedtime.   timolol 0.5 % ophthalmic solution Commonly known as: TIMOPTIC Place 1 drop into both eyes 2 (two) times daily.      Follow-up Information    [name], MD Follow up.   Specialty: Neurosurgery Contact information: [address] [place] [address] [place] [address] [phone]           Signed: Traci Sermon 04/17/2019, 1:28 PM

## 2019-04-17 NOTE — Discharge Instructions (Signed)

## 2019-04-17 NOTE — Evaluation (Signed)
Occupational Therapy Evaluation Patient Details Name: Shannon Gutierrez MRN: [id number] DOB: 06-03-1946 Today's Date: 04/17/2019    History of Present Illness 73 yo female s/p ALIF L3-S1 YKD:XIPJASN, arthritis,DDD,Diverticulosis,Kidney stones, hypothyroidism,urinary urgency,R foot weakness, back surg 2013   Clinical Impression   Patient is s/p ALIF L3-S1 surgery resulting in functional limitations due to the deficits listed below (see OT problem list). Pt with poor recall of precautions and demonstration of these precautions. Pt moving well this session but with safety concerns due to recall. Pt is able to figure 4 cross for LB dressing with a  Reacher at home currently if needed.  Patient will benefit from skilled OT acutely to increase independence and safety with ADLS to allow discharge HHOT.     Follow Up Recommendations  Home health OT    Equipment Recommendations  None recommended by OT    Recommendations for Other Services       Precautions / Restrictions Precautions Precautions: Back Precaution Comments: handout provided and reviewed for adls with precautions Required Braces or Orthoses: Spinal Brace Spinal Brace: Applied in sitting position;Lumbar corset Restrictions Weight Bearing Restrictions: No      Mobility Bed Mobility               General bed mobility comments: in chair on arrival  Transfers Overall transfer level: Needs assistance Equipment used: Rolling walker (2 wheeled) Transfers: Sit to/from Stand Sit to Stand: Supervision         General transfer comment: educated on avoiding to pull up and sitting at edge of chair    Balance                                           ADL either performed or assessed with clinical judgement   ADL Overall ADL's : Needs assistance/impaired Eating/Feeding: Independent   Grooming: Supervision/safety   Upper Body Bathing: Supervision/ safety   Lower Body Bathing: Supervison/  safety   Upper Body Dressing : Supervision/safety   Lower Body Dressing: Supervision/safety Lower Body Dressing Details (indicate cue type and reason): don underwear with figure 4. patient has a reacher at home if needed and educated on use Toilet Transfer: Supervision/safety   Toileting- Architect and Hygiene: Supervision/safety Toileting - Architect Details (indicate cue type and reason): educated on toilet tong and pt states i need one of those   Web designer Details (indicate cue type and reason): educated on walk in shower as patient states that is what her daughter has within her home Functional mobility during ADLs: Supervision/safety General ADL Comments: pt abandoned RW several times during session. found sitting without brace. pt unhooking straps to have them dangle to don brace. pt twisting to reach for objects on bed surface. pt educatedon back  precautions, don of brace , positioning fo rsitting, alarm ffor medication management and LB dressing/bathing.     Vision Baseline Vision/History: Wears glasses Wears Glasses: At all times       Perception     Praxis      Pertinent Vitals/Pain Pain Assessment: No/denies pain     Hand Dominance Right   Extremity/Trunk Assessment Upper Extremity Assessment Upper Extremity Assessment: Overall WFL for tasks assessed   Lower Extremity Assessment Lower Extremity Assessment: Overall WFL for tasks assessed   Cervical / Trunk Assessment Cervical / Trunk Assessment: Other exceptions Cervical / Trunk Exceptions: s/p surg   Communication  Communication Communication: No difficulties   Cognition Arousal/Alertness: Awake/alert Behavior During Therapy: Flat affect;Impulsive Overall Cognitive Status: Impaired/Different from baseline Area of Impairment: Memory                     Memory: Decreased short-term memory         General Comments: pt with poor recall and needs redirection at  times for safety. pt denies awareness of these deficits.    General Comments  drain in place and pt lacked awareness to drain. pt educatedo n drain and need for drain to come out prior to home    Exercises     Shoulder Instructions      Home Living Family/patient expects to be discharged to:: Private residence Living Arrangements: Alone Available Help at Discharge: Family Type of Home: House Home Access: Stairs to enter Technical brewer of Steps: 2 Entrance Stairs-Rails: None Home Layout: One level     Bathroom Shower/Tub: Occupational psychologist: [place]: Environmental consultant - 2 wheels   Additional Comments: going to daughters house to stay. daughter has children in school       Prior Functioning/Environment Level of Independence: Independent                 OT Problem List: Impaired balance (sitting and/or standing);Decreased cognition;Decreased safety awareness;Decreased knowledge of use of DME or AE;Decreased knowledge of precautions      OT Treatment/Interventions: Self-care/ADL training;Therapeutic exercise;Neuromuscular education;Energy conservation;DME and/or AE instruction;Therapeutic activities;Cognitive remediation/compensation;Patient/family education;Balance training    OT Goals(Current goals can be found in the care plan section) Acute Rehab OT Goals Patient Stated Goal: to go home to my daughters house today after my son picks me up OT Goal Formulation: With patient Time For Goal Achievement: 05/01/19 Potential to Achieve Goals: Good  OT Frequency: Min 2X/week   Barriers to D/C:            Co-evaluation              AM-PAC OT "6 Clicks" Daily Activity     Outcome Measure Help from another person eating meals?: None Help from another person taking care of personal grooming?: A Little Help from another person toileting, which includes using toliet, bedpan, or urinal?: A Little Help from another person bathing  (including washing, rinsing, drying)?: A Little Help from another person to put on and taking off regular upper body clothing?: A Little Help from another person to put on and taking off regular lower body clothing?: A Little 6 Click Score: 19   End of Session Equipment Utilized During Treatment: Back brace;Rolling walker Nurse Communication: Mobility status;Precautions  Activity Tolerance: Patient tolerated treatment well Patient left: in chair;with call bell/phone within reach  OT Visit Diagnosis: Unsteadiness on feet (R26.81);Muscle weakness (generalized) (M62.81)                Time: 4696-2952 OT Time Calculation (min): 17 min Charges:  OT General Charges $OT Visit: 1 Visit OT Evaluation $OT Eval Moderate Complexity: 1 Mod   Jeri Modena, OTR/L  Acute Rehabilitation Services Pager: [phone] Office: [phone] .   Jeri Modena 04/17/2019, 9:17 AM

## 2019-04-17 NOTE — Evaluation (Signed)
Physical Therapy Evaluation and Discharge Patient Details Name: Shannon Gutierrez MRN: [id number] DOB: 12-Dec-1945 Today's Date: 04/17/2019   History of Present Illness  73 yo female s/p ALIF L3-S1 EHU:DJSHFWY, arthritis,DDD,Diverticulosis,Kidney stones, hypothyroidism,urinary urgency,R foot weakness, back surg 2013  Clinical Impression  Patient evaluated by Physical Therapy with no further acute PT needs identified. All education has been completed and the patient has no further questions. Pt was able to demonstrate transfers and ambulation with gross supervision for safety with RW for support. Pt was educated on precautions, brace application/wearing schedule, appropriate activity progression, and car transfer. Somewhat poor recall of precautions but overall functioning well from a PT standpoint and feel she will be safe at home with daughter for supervision. See below for any follow-up Physical Therapy or equipment needs. PT is signing off. Thank you for this referral.      Follow Up Recommendations No PT follow up;Supervision for mobility/OOB    Equipment Recommendations  None recommended by PT    Recommendations for Other Services       Precautions / Restrictions Precautions Precautions: Back Precaution Booklet Issued: Yes (comment) Precaution Comments: Reviewed precautions verbally during functional mobility. Quick review of handout at end of session.  Required Braces or Orthoses: Spinal Brace Spinal Brace: Applied in sitting position;Lumbar corset Restrictions Weight Bearing Restrictions: No      Mobility  Bed Mobility               General bed mobility comments: in chair on arrival. Verbally reviewed log roll technique.   Transfers Overall transfer level: Needs assistance Equipment used: Rolling walker (2 wheeled) Transfers: Sit to/from Stand Sit to Stand: Supervision         General transfer comment: VC's for hand placement on seated surface for safety.  Practiced x2 to gain proper form.   Ambulation/Gait Ambulation/Gait assistance: Supervision Gait Distance (Feet): 300 Feet Assistive device: Rolling walker (2 wheeled) Gait Pattern/deviations: Step-through pattern;Decreased stride length;Trunk flexed Gait velocity: Decreased Gait velocity interpretation: <1.8 ft/sec, indicate of risk for recurrent falls General Gait Details: Slow but generally steady with RW for support. Height of walker adjusted for proper fit. Pt was cued throughout for improved posture, closer walker proximity, and forward gaze.   Stairs Stairs: Yes Stairs assistance: Min guard Stair Management: One rail Right;Step to pattern;Forwards Number of Stairs: 4(1 step x4 trials) General stair comments: VC's for use of rail to simulate daughter's home, and sequencing. Pt completed 1 step over 4 trials without difficulty.   Wheelchair Mobility    Modified Rankin (Stroke Patients Only)       Balance Overall balance assessment: Needs assistance Sitting-balance support: No upper extremity supported;Feet supported Sitting balance-Leahy Scale: Fair     Standing balance support: Bilateral upper extremity supported;During functional activity Standing balance-Leahy Scale: Poor Standing balance comment: reliant on UE support for OOB mobility.                              Pertinent Vitals/Pain Pain Assessment: No/denies pain    Home Living Family/patient expects to be discharged to:: Private residence Living Arrangements: Alone Available Help at Discharge: Family Type of Home: House Home Access: Stairs to enter Entrance Stairs-Rails: None Entrance Stairs-Number of Steps: 2 Home Layout: One level Home Equipment: Environmental consultant - 2 wheels Additional Comments: going to daughters house to stay. daughter has children in school     Prior Function Level of Independence: Independent  Hand Dominance   Dominant Hand: Right    Extremity/Trunk  Assessment   Upper Extremity Assessment Upper Extremity Assessment: Overall WFL for tasks assessed    Lower Extremity Assessment Lower Extremity Assessment: Overall WFL for tasks assessed    Cervical / Trunk Assessment Cervical / Trunk Assessment: Other exceptions Cervical / Trunk Exceptions: s/p surg  Communication   Communication: No difficulties  Cognition Arousal/Alertness: Awake/alert Behavior During Therapy: Flat affect Overall Cognitive Status: Impaired/Different from baseline Area of Impairment: Memory                     Memory: Decreased short-term memory;Decreased recall of precautions         General Comments: Pt will poor recall. Reports that she was surprised that our precaution sheet said to use baking soda and peroxide to brush her teeth. When therapist asked her to show me where it says that on the precaution sheet, pt looked over the sheet several times and then laughed, stating "I must be out of it again."      General Comments General comments (skin integrity, edema, etc.): drain in place and pt lacked awareness to drain. pt educatedo n drain and need for drain to come out prior to home    Exercises     Assessment/Plan    PT Assessment Patent does not need any further PT services  PT Problem List Decreased strength;Decreased activity tolerance;Decreased balance;Decreased mobility;Decreased knowledge of use of DME;Decreased safety awareness;Decreased knowledge of precautions;Pain       PT Treatment Interventions      PT Goals (Current goals can be found in the Care Plan section)  Acute Rehab PT Goals Patient Stated Goal: to go home to my daughters house today after my son picks me up PT Goal Formulation: All assessment and education complete, DC therapy    Frequency     Barriers to discharge        Co-evaluation               AM-PAC PT "6 Clicks" Mobility  Outcome Measure Help needed turning from your back to your side while  in a flat bed without using bedrails?: None Help needed moving from lying on your back to sitting on the side of a flat bed without using bedrails?: A Little Help needed moving to and from a bed to a chair (including a wheelchair)?: None Help needed standing up from a chair using your arms (e.g., wheelchair or bedside chair)?: None Help needed to walk in hospital room?: None Help needed climbing 3-5 steps with a railing? : A Little 6 Click Score: 22    End of Session Equipment Utilized During Treatment: Gait belt;Back brace Activity Tolerance: Patient tolerated treatment well Patient left: in chair;with call bell/phone within reach Nurse Communication: Mobility status PT Visit Diagnosis: Unsteadiness on feet (R26.81);Pain Pain - part of body: (back)    Time: 4259-5638 PT Time Calculation (min) (ACUTE ONLY): 22 min   Charges:   PT Evaluation $PT Eval Low Complexity: 1 Low          Conni Slipper, PT, DPT Acute Rehabilitation Services Pager: [phone] Office: [phone]   Marylynn Pearson 04/17/2019, 10:13 AM

## 2019-04-17 NOTE — Progress Notes (Signed)
Subjective: Patient reports Overall doing okay condition of back pain a little bit of dizziness when she gets up  Objective: Vital signs in last 24 hours: Temp:  [97 F (36.1 C)-98.1 F (36.7 C)] 98.1 F (36.7 C) (10/13 0317) Pulse Rate:  [72-93] 73 (10/13 0317) Resp:  [6-20] 18 (10/13 0317) BP: (109-170)/(56-93) 123/58 (10/13 0317) SpO2:  [92 %-100 %] 94 % (10/13 0317)  Intake/Output from previous day: 10/12 0701 - 10/13 0700 In: 2243 [Gutierrez.O.:240; I.V.:2003] Out: 715 [Urine:435; Drains:230; Blood:50] Intake/Output this shift: Total I/O In: 3 [I.V.:3] Out: 50 [Drains:50]  Awake alert strength 5 out of 5 wounds clean dry and intact  Lab Results: No results for input(s): WBC, HGB, HCT, PLT in the last 72 hours. BMET Recent Labs    04/16/19 1643  CREATININE 0.84    Studies/Results: Dg Lumbar Spine 2-3 Views  Result Date: 04/16/2019 CLINICAL DATA:  Extension of lumbar fusion at L2-3 EXAM: LUMBAR SPINE - 2-3 VIEW; DG C-ARM 1-60 MIN COMPARISON:  CT 03/06/2019 FINDINGS: Intraoperative spot images demonstrate placement of pedicle screws at L2-3 and removal of the prior mid to lower pedicle screws. No visible hardware bony complicating feature. IMPRESSION: Posterior pedicle screw placement at L2-3 and removal of prior hardware. No visible complicating feature. Electronically Signed   By: Rolm Baptise M.D.   On: 04/16/2019 11:22   Dg C-arm 1-60 Min  Result Date: 04/16/2019 CLINICAL DATA:  Extension of lumbar fusion at L2-3 EXAM: LUMBAR SPINE - 2-3 VIEW; DG C-ARM 1-60 MIN COMPARISON:  CT 03/06/2019 FINDINGS: Intraoperative spot images demonstrate placement of pedicle screws at L2-3 and removal of the prior mid to lower pedicle screws. No visible hardware bony complicating feature. IMPRESSION: Posterior pedicle screw placement at L2-3 and removal of prior hardware. No visible complicating feature. Electronically Signed   By: Rolm Baptise M.D.   On: 04/16/2019 11:22     Assessment/Plan: Postop day 1 anterior lateral interbody fusion with posterior augmentation patient is doing very well has been ambulating and voiding will work with physical therapy today hydrate her get her some fluids and if her dizziness is improved and she feels up to it we can let her be discharged later today.  LOS: 1 day     Shannon Gutierrez 04/17/2019, 6:47 AM

## 2019-04-17 NOTE — Plan of Care (Signed)
Patient alert and oriented, mae's well, voiding adequate amount of urine, swallowing without difficulty, no c/o pain at time of discharge. Patient discharged home with family. Script and discharged instructions given to patient. Patient and family stated understanding of instructions given. Patient has an appointment with Dr. Cram 

## 2019-04-18 ENCOUNTER — Encounter (HOSPITAL_COMMUNITY): Payer: Self-pay | Admitting: Neurosurgery

## 2019-04-19 DIAGNOSIS — N3281 Overactive bladder: Secondary | ICD-10-CM | POA: Diagnosis not present

## 2019-04-19 DIAGNOSIS — F419 Anxiety disorder, unspecified: Secondary | ICD-10-CM | POA: Diagnosis not present

## 2019-04-19 DIAGNOSIS — Z87891 Personal history of nicotine dependence: Secondary | ICD-10-CM | POA: Diagnosis not present

## 2019-04-19 DIAGNOSIS — E039 Hypothyroidism, unspecified: Secondary | ICD-10-CM | POA: Diagnosis not present

## 2019-04-19 DIAGNOSIS — Z981 Arthrodesis status: Secondary | ICD-10-CM | POA: Diagnosis not present

## 2019-04-19 DIAGNOSIS — G8929 Other chronic pain: Secondary | ICD-10-CM | POA: Diagnosis not present

## 2019-04-19 DIAGNOSIS — M5136 Other intervertebral disc degeneration, lumbar region: Secondary | ICD-10-CM | POA: Diagnosis not present

## 2019-04-19 DIAGNOSIS — K219 Gastro-esophageal reflux disease without esophagitis: Secondary | ICD-10-CM | POA: Diagnosis not present

## 2019-04-19 DIAGNOSIS — E785 Hyperlipidemia, unspecified: Secondary | ICD-10-CM | POA: Diagnosis not present

## 2019-04-19 DIAGNOSIS — Z4789 Encounter for other orthopedic aftercare: Secondary | ICD-10-CM | POA: Diagnosis not present

## 2019-04-19 DIAGNOSIS — K5790 Diverticulosis of intestine, part unspecified, without perforation or abscess without bleeding: Secondary | ICD-10-CM | POA: Diagnosis not present

## 2019-04-19 DIAGNOSIS — Z9181 History of falling: Secondary | ICD-10-CM | POA: Diagnosis not present

## 2019-04-19 DIAGNOSIS — M4726 Other spondylosis with radiculopathy, lumbar region: Secondary | ICD-10-CM | POA: Diagnosis not present

## 2019-04-19 DIAGNOSIS — M48061 Spinal stenosis, lumbar region without neurogenic claudication: Secondary | ICD-10-CM | POA: Diagnosis not present

## 2019-04-19 DIAGNOSIS — M1990 Unspecified osteoarthritis, unspecified site: Secondary | ICD-10-CM | POA: Diagnosis not present

## 2019-05-01 DIAGNOSIS — Z4789 Encounter for other orthopedic aftercare: Secondary | ICD-10-CM | POA: Diagnosis not present

## 2019-05-01 DIAGNOSIS — G8929 Other chronic pain: Secondary | ICD-10-CM | POA: Diagnosis not present

## 2019-05-01 DIAGNOSIS — M4726 Other spondylosis with radiculopathy, lumbar region: Secondary | ICD-10-CM | POA: Diagnosis not present

## 2019-05-01 DIAGNOSIS — Z981 Arthrodesis status: Secondary | ICD-10-CM | POA: Diagnosis not present

## 2019-05-01 DIAGNOSIS — F419 Anxiety disorder, unspecified: Secondary | ICD-10-CM | POA: Diagnosis not present

## 2019-05-01 DIAGNOSIS — K219 Gastro-esophageal reflux disease without esophagitis: Secondary | ICD-10-CM | POA: Diagnosis not present

## 2019-05-01 DIAGNOSIS — M1990 Unspecified osteoarthritis, unspecified site: Secondary | ICD-10-CM | POA: Diagnosis not present

## 2019-05-01 DIAGNOSIS — E039 Hypothyroidism, unspecified: Secondary | ICD-10-CM | POA: Diagnosis not present

## 2019-05-01 DIAGNOSIS — K5790 Diverticulosis of intestine, part unspecified, without perforation or abscess without bleeding: Secondary | ICD-10-CM | POA: Diagnosis not present

## 2019-05-01 DIAGNOSIS — M48061 Spinal stenosis, lumbar region without neurogenic claudication: Secondary | ICD-10-CM | POA: Diagnosis not present

## 2019-05-01 DIAGNOSIS — E785 Hyperlipidemia, unspecified: Secondary | ICD-10-CM | POA: Diagnosis not present

## 2019-05-01 DIAGNOSIS — M5136 Other intervertebral disc degeneration, lumbar region: Secondary | ICD-10-CM | POA: Diagnosis not present

## 2019-05-01 DIAGNOSIS — Z9181 History of falling: Secondary | ICD-10-CM | POA: Diagnosis not present

## 2019-05-01 DIAGNOSIS — Z87891 Personal history of nicotine dependence: Secondary | ICD-10-CM | POA: Diagnosis not present

## 2019-05-01 DIAGNOSIS — N3281 Overactive bladder: Secondary | ICD-10-CM | POA: Diagnosis not present

## 2019-05-02 DIAGNOSIS — K219 Gastro-esophageal reflux disease without esophagitis: Secondary | ICD-10-CM | POA: Diagnosis not present

## 2019-05-02 DIAGNOSIS — M48061 Spinal stenosis, lumbar region without neurogenic claudication: Secondary | ICD-10-CM | POA: Diagnosis not present

## 2019-05-02 DIAGNOSIS — K5790 Diverticulosis of intestine, part unspecified, without perforation or abscess without bleeding: Secondary | ICD-10-CM | POA: Diagnosis not present

## 2019-05-02 DIAGNOSIS — Z981 Arthrodesis status: Secondary | ICD-10-CM | POA: Diagnosis not present

## 2019-05-02 DIAGNOSIS — M5136 Other intervertebral disc degeneration, lumbar region: Secondary | ICD-10-CM | POA: Diagnosis not present

## 2019-05-02 DIAGNOSIS — M1990 Unspecified osteoarthritis, unspecified site: Secondary | ICD-10-CM | POA: Diagnosis not present

## 2019-05-02 DIAGNOSIS — G8929 Other chronic pain: Secondary | ICD-10-CM | POA: Diagnosis not present

## 2019-05-02 DIAGNOSIS — E785 Hyperlipidemia, unspecified: Secondary | ICD-10-CM | POA: Diagnosis not present

## 2019-05-02 DIAGNOSIS — Z4789 Encounter for other orthopedic aftercare: Secondary | ICD-10-CM | POA: Diagnosis not present

## 2019-05-02 DIAGNOSIS — Z9181 History of falling: Secondary | ICD-10-CM | POA: Diagnosis not present

## 2019-05-02 DIAGNOSIS — Z87891 Personal history of nicotine dependence: Secondary | ICD-10-CM | POA: Diagnosis not present

## 2019-05-02 DIAGNOSIS — N3281 Overactive bladder: Secondary | ICD-10-CM | POA: Diagnosis not present

## 2019-05-02 DIAGNOSIS — E039 Hypothyroidism, unspecified: Secondary | ICD-10-CM | POA: Diagnosis not present

## 2019-05-02 DIAGNOSIS — F419 Anxiety disorder, unspecified: Secondary | ICD-10-CM | POA: Diagnosis not present

## 2019-05-02 DIAGNOSIS — M4726 Other spondylosis with radiculopathy, lumbar region: Secondary | ICD-10-CM | POA: Diagnosis not present

## 2019-05-04 DIAGNOSIS — Z87891 Personal history of nicotine dependence: Secondary | ICD-10-CM | POA: Diagnosis not present

## 2019-05-04 DIAGNOSIS — M5136 Other intervertebral disc degeneration, lumbar region: Secondary | ICD-10-CM | POA: Diagnosis not present

## 2019-05-04 DIAGNOSIS — M48061 Spinal stenosis, lumbar region without neurogenic claudication: Secondary | ICD-10-CM | POA: Diagnosis not present

## 2019-05-04 DIAGNOSIS — M1990 Unspecified osteoarthritis, unspecified site: Secondary | ICD-10-CM | POA: Diagnosis not present

## 2019-05-04 DIAGNOSIS — N3281 Overactive bladder: Secondary | ICD-10-CM | POA: Diagnosis not present

## 2019-05-04 DIAGNOSIS — K5790 Diverticulosis of intestine, part unspecified, without perforation or abscess without bleeding: Secondary | ICD-10-CM | POA: Diagnosis not present

## 2019-05-04 DIAGNOSIS — E785 Hyperlipidemia, unspecified: Secondary | ICD-10-CM | POA: Diagnosis not present

## 2019-05-04 DIAGNOSIS — Z981 Arthrodesis status: Secondary | ICD-10-CM | POA: Diagnosis not present

## 2019-05-04 DIAGNOSIS — Z9181 History of falling: Secondary | ICD-10-CM | POA: Diagnosis not present

## 2019-05-04 DIAGNOSIS — M4726 Other spondylosis with radiculopathy, lumbar region: Secondary | ICD-10-CM | POA: Diagnosis not present

## 2019-05-04 DIAGNOSIS — E039 Hypothyroidism, unspecified: Secondary | ICD-10-CM | POA: Diagnosis not present

## 2019-05-04 DIAGNOSIS — Z4789 Encounter for other orthopedic aftercare: Secondary | ICD-10-CM | POA: Diagnosis not present

## 2019-05-04 DIAGNOSIS — G8929 Other chronic pain: Secondary | ICD-10-CM | POA: Diagnosis not present

## 2019-05-04 DIAGNOSIS — K219 Gastro-esophageal reflux disease without esophagitis: Secondary | ICD-10-CM | POA: Diagnosis not present

## 2019-05-04 DIAGNOSIS — F419 Anxiety disorder, unspecified: Secondary | ICD-10-CM | POA: Diagnosis not present

## 2019-05-07 DIAGNOSIS — E039 Hypothyroidism, unspecified: Secondary | ICD-10-CM | POA: Diagnosis not present

## 2019-05-07 DIAGNOSIS — M4726 Other spondylosis with radiculopathy, lumbar region: Secondary | ICD-10-CM | POA: Diagnosis not present

## 2019-05-07 DIAGNOSIS — Z4789 Encounter for other orthopedic aftercare: Secondary | ICD-10-CM | POA: Diagnosis not present

## 2019-05-07 DIAGNOSIS — M5136 Other intervertebral disc degeneration, lumbar region: Secondary | ICD-10-CM | POA: Diagnosis not present

## 2019-05-07 DIAGNOSIS — K5790 Diverticulosis of intestine, part unspecified, without perforation or abscess without bleeding: Secondary | ICD-10-CM | POA: Diagnosis not present

## 2019-05-07 DIAGNOSIS — G8929 Other chronic pain: Secondary | ICD-10-CM | POA: Diagnosis not present

## 2019-05-07 DIAGNOSIS — M1990 Unspecified osteoarthritis, unspecified site: Secondary | ICD-10-CM | POA: Diagnosis not present

## 2019-05-07 DIAGNOSIS — F419 Anxiety disorder, unspecified: Secondary | ICD-10-CM | POA: Diagnosis not present

## 2019-05-07 DIAGNOSIS — K219 Gastro-esophageal reflux disease without esophagitis: Secondary | ICD-10-CM | POA: Diagnosis not present

## 2019-05-07 DIAGNOSIS — E785 Hyperlipidemia, unspecified: Secondary | ICD-10-CM | POA: Diagnosis not present

## 2019-05-07 DIAGNOSIS — Z981 Arthrodesis status: Secondary | ICD-10-CM | POA: Diagnosis not present

## 2019-05-07 DIAGNOSIS — Z9181 History of falling: Secondary | ICD-10-CM | POA: Diagnosis not present

## 2019-05-07 DIAGNOSIS — M48061 Spinal stenosis, lumbar region without neurogenic claudication: Secondary | ICD-10-CM | POA: Diagnosis not present

## 2019-05-07 DIAGNOSIS — N3281 Overactive bladder: Secondary | ICD-10-CM | POA: Diagnosis not present

## 2019-05-07 DIAGNOSIS — Z87891 Personal history of nicotine dependence: Secondary | ICD-10-CM | POA: Diagnosis not present

## 2019-05-22 DIAGNOSIS — M48062 Spinal stenosis, lumbar region with neurogenic claudication: Secondary | ICD-10-CM | POA: Diagnosis not present

## 2019-05-23 DIAGNOSIS — H40053 Ocular hypertension, bilateral: Secondary | ICD-10-CM | POA: Diagnosis not present

## 2019-05-23 DIAGNOSIS — H04123 Dry eye syndrome of bilateral lacrimal glands: Secondary | ICD-10-CM | POA: Diagnosis not present

## 2019-06-06 DIAGNOSIS — Z23 Encounter for immunization: Secondary | ICD-10-CM | POA: Diagnosis not present

## 2019-06-28 DIAGNOSIS — Z20828 Contact with and (suspected) exposure to other viral communicable diseases: Secondary | ICD-10-CM | POA: Diagnosis not present

## 2019-06-28 DIAGNOSIS — Z03818 Encounter for observation for suspected exposure to other biological agents ruled out: Secondary | ICD-10-CM | POA: Diagnosis not present

## 2019-07-10 DIAGNOSIS — E039 Hypothyroidism, unspecified: Secondary | ICD-10-CM | POA: Diagnosis not present

## 2019-07-17 DIAGNOSIS — M4316 Spondylolisthesis, lumbar region: Secondary | ICD-10-CM | POA: Diagnosis not present

## 2019-07-19 DIAGNOSIS — Z9882 Breast implant status: Secondary | ICD-10-CM | POA: Diagnosis not present

## 2019-07-19 DIAGNOSIS — Z1231 Encounter for screening mammogram for malignant neoplasm of breast: Secondary | ICD-10-CM | POA: Diagnosis not present

## 2019-08-14 DIAGNOSIS — E039 Hypothyroidism, unspecified: Secondary | ICD-10-CM | POA: Diagnosis not present

## 2019-08-14 DIAGNOSIS — R413 Other amnesia: Secondary | ICD-10-CM | POA: Diagnosis not present

## 2019-08-14 DIAGNOSIS — Z0000 Encounter for general adult medical examination without abnormal findings: Secondary | ICD-10-CM | POA: Diagnosis not present

## 2019-08-27 DIAGNOSIS — R413 Other amnesia: Secondary | ICD-10-CM | POA: Diagnosis not present

## 2019-08-27 DIAGNOSIS — G3184 Mild cognitive impairment, so stated: Secondary | ICD-10-CM | POA: Diagnosis not present

## 2019-09-03 DIAGNOSIS — R413 Other amnesia: Secondary | ICD-10-CM | POA: Diagnosis not present

## 2019-09-03 DIAGNOSIS — R41840 Attention and concentration deficit: Secondary | ICD-10-CM | POA: Diagnosis not present

## 2019-09-03 DIAGNOSIS — G3184 Mild cognitive impairment, so stated: Secondary | ICD-10-CM | POA: Diagnosis not present

## 2019-09-06 DIAGNOSIS — Z7409 Other reduced mobility: Secondary | ICD-10-CM | POA: Diagnosis not present

## 2019-09-06 DIAGNOSIS — R531 Weakness: Secondary | ICD-10-CM | POA: Diagnosis not present

## 2019-09-06 DIAGNOSIS — R269 Unspecified abnormalities of gait and mobility: Secondary | ICD-10-CM | POA: Diagnosis not present

## 2019-09-06 DIAGNOSIS — R2689 Other abnormalities of gait and mobility: Secondary | ICD-10-CM | POA: Diagnosis not present

## 2019-09-08 DIAGNOSIS — R413 Other amnesia: Secondary | ICD-10-CM | POA: Diagnosis not present

## 2019-09-08 DIAGNOSIS — G3184 Mild cognitive impairment, so stated: Secondary | ICD-10-CM | POA: Diagnosis not present

## 2019-09-17 DIAGNOSIS — H53131 Sudden visual loss, right eye: Secondary | ICD-10-CM | POA: Diagnosis not present

## 2019-09-17 DIAGNOSIS — H5461 Unqualified visual loss, right eye, normal vision left eye: Secondary | ICD-10-CM | POA: Diagnosis not present

## 2019-09-17 DIAGNOSIS — H539 Unspecified visual disturbance: Secondary | ICD-10-CM | POA: Diagnosis not present

## 2019-09-17 DIAGNOSIS — Z87891 Personal history of nicotine dependence: Secondary | ICD-10-CM | POA: Diagnosis not present

## 2019-09-18 DIAGNOSIS — H04123 Dry eye syndrome of bilateral lacrimal glands: Secondary | ICD-10-CM | POA: Diagnosis not present

## 2019-09-18 DIAGNOSIS — H40053 Ocular hypertension, bilateral: Secondary | ICD-10-CM | POA: Diagnosis not present

## 2019-09-20 DIAGNOSIS — H5461 Unqualified visual loss, right eye, normal vision left eye: Secondary | ICD-10-CM | POA: Diagnosis not present

## 2019-09-20 DIAGNOSIS — G3184 Mild cognitive impairment, so stated: Secondary | ICD-10-CM | POA: Diagnosis not present

## 2019-09-24 DIAGNOSIS — I6521 Occlusion and stenosis of right carotid artery: Secondary | ICD-10-CM | POA: Diagnosis not present

## 2019-09-24 DIAGNOSIS — H5461 Unqualified visual loss, right eye, normal vision left eye: Secondary | ICD-10-CM | POA: Diagnosis not present

## 2019-09-24 DIAGNOSIS — R42 Dizziness and giddiness: Secondary | ICD-10-CM | POA: Diagnosis not present

## 2019-09-26 DIAGNOSIS — I6782 Cerebral ischemia: Secondary | ICD-10-CM | POA: Diagnosis not present

## 2019-09-26 DIAGNOSIS — G3184 Mild cognitive impairment, so stated: Secondary | ICD-10-CM | POA: Diagnosis not present

## 2019-10-08 DIAGNOSIS — Z7409 Other reduced mobility: Secondary | ICD-10-CM | POA: Diagnosis not present

## 2019-10-08 DIAGNOSIS — R269 Unspecified abnormalities of gait and mobility: Secondary | ICD-10-CM | POA: Diagnosis not present

## 2019-10-08 DIAGNOSIS — R2689 Other abnormalities of gait and mobility: Secondary | ICD-10-CM | POA: Diagnosis not present

## 2019-10-08 DIAGNOSIS — R531 Weakness: Secondary | ICD-10-CM | POA: Diagnosis not present

## 2019-11-06 DIAGNOSIS — E039 Hypothyroidism, unspecified: Secondary | ICD-10-CM | POA: Diagnosis not present

## 2019-11-12 DIAGNOSIS — K2100 Gastro-esophageal reflux disease with esophagitis, without bleeding: Secondary | ICD-10-CM | POA: Diagnosis not present

## 2019-11-12 DIAGNOSIS — F3342 Major depressive disorder, recurrent, in full remission: Secondary | ICD-10-CM | POA: Diagnosis not present

## 2019-11-12 DIAGNOSIS — E039 Hypothyroidism, unspecified: Secondary | ICD-10-CM | POA: Diagnosis not present

## 2019-11-12 DIAGNOSIS — G3184 Mild cognitive impairment, so stated: Secondary | ICD-10-CM | POA: Diagnosis not present

## 2019-11-12 DIAGNOSIS — H8110 Benign paroxysmal vertigo, unspecified ear: Secondary | ICD-10-CM | POA: Diagnosis not present

## 2019-11-12 DIAGNOSIS — Z79890 Hormone replacement therapy (postmenopausal): Secondary | ICD-10-CM | POA: Diagnosis not present

## 2019-11-12 DIAGNOSIS — M48062 Spinal stenosis, lumbar region with neurogenic claudication: Secondary | ICD-10-CM | POA: Diagnosis not present

## 2019-11-12 DIAGNOSIS — I6521 Occlusion and stenosis of right carotid artery: Secondary | ICD-10-CM | POA: Diagnosis not present

## 2019-11-12 DIAGNOSIS — E785 Hyperlipidemia, unspecified: Secondary | ICD-10-CM | POA: Diagnosis not present

## 2019-11-13 DIAGNOSIS — Z7982 Long term (current) use of aspirin: Secondary | ICD-10-CM | POA: Diagnosis not present

## 2019-11-13 DIAGNOSIS — Z87891 Personal history of nicotine dependence: Secondary | ICD-10-CM | POA: Diagnosis not present

## 2019-11-13 DIAGNOSIS — G453 Amaurosis fugax: Secondary | ICD-10-CM | POA: Diagnosis not present

## 2019-11-28 DIAGNOSIS — G459 Transient cerebral ischemic attack, unspecified: Secondary | ICD-10-CM | POA: Diagnosis not present

## 2019-11-28 DIAGNOSIS — I083 Combined rheumatic disorders of mitral, aortic and tricuspid valves: Secondary | ICD-10-CM | POA: Diagnosis not present

## 2019-11-28 DIAGNOSIS — Z8673 Personal history of transient ischemic attack (TIA), and cerebral infarction without residual deficits: Secondary | ICD-10-CM | POA: Diagnosis not present

## 2019-12-10 DIAGNOSIS — G3184 Mild cognitive impairment, so stated: Secondary | ICD-10-CM | POA: Diagnosis not present

## 2019-12-10 DIAGNOSIS — R42 Dizziness and giddiness: Secondary | ICD-10-CM | POA: Diagnosis not present

## 2019-12-10 DIAGNOSIS — F329 Major depressive disorder, single episode, unspecified: Secondary | ICD-10-CM | POA: Diagnosis not present

## 2019-12-10 DIAGNOSIS — E039 Hypothyroidism, unspecified: Secondary | ICD-10-CM | POA: Diagnosis not present

## 2019-12-25 DIAGNOSIS — F4322 Adjustment disorder with anxiety: Secondary | ICD-10-CM | POA: Diagnosis not present

## 2019-12-25 DIAGNOSIS — F419 Anxiety disorder, unspecified: Secondary | ICD-10-CM | POA: Diagnosis not present

## 2019-12-25 DIAGNOSIS — F411 Generalized anxiety disorder: Secondary | ICD-10-CM | POA: Diagnosis not present

## 2020-01-21 DIAGNOSIS — M4726 Other spondylosis with radiculopathy, lumbar region: Secondary | ICD-10-CM | POA: Diagnosis not present

## 2020-01-21 DIAGNOSIS — M48061 Spinal stenosis, lumbar region without neurogenic claudication: Secondary | ICD-10-CM | POA: Diagnosis not present

## 2020-01-21 DIAGNOSIS — Z4789 Encounter for other orthopedic aftercare: Secondary | ICD-10-CM | POA: Diagnosis not present

## 2020-01-21 DIAGNOSIS — K5790 Diverticulosis of intestine, part unspecified, without perforation or abscess without bleeding: Secondary | ICD-10-CM | POA: Diagnosis not present

## 2020-01-21 DIAGNOSIS — G8929 Other chronic pain: Secondary | ICD-10-CM | POA: Diagnosis not present

## 2020-01-21 DIAGNOSIS — E039 Hypothyroidism, unspecified: Secondary | ICD-10-CM | POA: Diagnosis not present

## 2020-01-21 DIAGNOSIS — M5136 Other intervertebral disc degeneration, lumbar region: Secondary | ICD-10-CM | POA: Diagnosis not present

## 2020-01-21 DIAGNOSIS — M1990 Unspecified osteoarthritis, unspecified site: Secondary | ICD-10-CM | POA: Diagnosis not present

## 2020-01-21 DIAGNOSIS — F419 Anxiety disorder, unspecified: Secondary | ICD-10-CM | POA: Diagnosis not present

## 2020-01-21 DIAGNOSIS — K219 Gastro-esophageal reflux disease without esophagitis: Secondary | ICD-10-CM | POA: Diagnosis not present

## 2020-01-21 DIAGNOSIS — E785 Hyperlipidemia, unspecified: Secondary | ICD-10-CM | POA: Diagnosis not present

## 2020-01-21 DIAGNOSIS — N3281 Overactive bladder: Secondary | ICD-10-CM | POA: Diagnosis not present

## 2020-01-22 DIAGNOSIS — F3342 Major depressive disorder, recurrent, in full remission: Secondary | ICD-10-CM | POA: Diagnosis not present

## 2020-01-22 DIAGNOSIS — F411 Generalized anxiety disorder: Secondary | ICD-10-CM | POA: Diagnosis not present

## 2020-01-22 DIAGNOSIS — F4322 Adjustment disorder with anxiety: Secondary | ICD-10-CM | POA: Diagnosis not present

## 2020-01-22 DIAGNOSIS — Z79899 Other long term (current) drug therapy: Secondary | ICD-10-CM | POA: Diagnosis not present

## 2020-02-11 DIAGNOSIS — R42 Dizziness and giddiness: Secondary | ICD-10-CM | POA: Diagnosis not present

## 2020-02-11 DIAGNOSIS — F329 Major depressive disorder, single episode, unspecified: Secondary | ICD-10-CM | POA: Diagnosis not present

## 2020-02-11 DIAGNOSIS — G3184 Mild cognitive impairment, so stated: Secondary | ICD-10-CM | POA: Diagnosis not present

## 2020-03-14 DIAGNOSIS — F4322 Adjustment disorder with anxiety: Secondary | ICD-10-CM | POA: Diagnosis not present

## 2020-03-14 DIAGNOSIS — I6521 Occlusion and stenosis of right carotid artery: Secondary | ICD-10-CM | POA: Diagnosis not present

## 2020-03-14 DIAGNOSIS — F411 Generalized anxiety disorder: Secondary | ICD-10-CM | POA: Diagnosis not present

## 2020-03-14 DIAGNOSIS — E039 Hypothyroidism, unspecified: Secondary | ICD-10-CM | POA: Diagnosis not present

## 2020-03-14 DIAGNOSIS — Z23 Encounter for immunization: Secondary | ICD-10-CM | POA: Diagnosis not present

## 2020-03-14 DIAGNOSIS — K2100 Gastro-esophageal reflux disease with esophagitis, without bleeding: Secondary | ICD-10-CM | POA: Diagnosis not present

## 2020-03-14 DIAGNOSIS — E785 Hyperlipidemia, unspecified: Secondary | ICD-10-CM | POA: Diagnosis not present

## 2020-03-14 DIAGNOSIS — Z79890 Hormone replacement therapy (postmenopausal): Secondary | ICD-10-CM | POA: Diagnosis not present

## 2020-04-08 DIAGNOSIS — R42 Dizziness and giddiness: Secondary | ICD-10-CM | POA: Diagnosis not present

## 2020-04-08 DIAGNOSIS — R2689 Other abnormalities of gait and mobility: Secondary | ICD-10-CM | POA: Diagnosis not present

## 2020-05-09 DIAGNOSIS — I951 Orthostatic hypotension: Secondary | ICD-10-CM | POA: Diagnosis not present

## 2020-05-09 DIAGNOSIS — G3184 Mild cognitive impairment, so stated: Secondary | ICD-10-CM | POA: Diagnosis not present

## 2020-05-09 DIAGNOSIS — R42 Dizziness and giddiness: Secondary | ICD-10-CM | POA: Diagnosis not present

## 2020-05-20 DIAGNOSIS — G3184 Mild cognitive impairment, so stated: Secondary | ICD-10-CM | POA: Diagnosis not present

## 2020-05-20 DIAGNOSIS — R42 Dizziness and giddiness: Secondary | ICD-10-CM | POA: Diagnosis not present

## 2020-05-20 DIAGNOSIS — Z8669 Personal history of other diseases of the nervous system and sense organs: Secondary | ICD-10-CM | POA: Diagnosis not present

## 2020-06-09 DIAGNOSIS — E785 Hyperlipidemia, unspecified: Secondary | ICD-10-CM | POA: Diagnosis not present

## 2020-06-09 DIAGNOSIS — E039 Hypothyroidism, unspecified: Secondary | ICD-10-CM | POA: Diagnosis not present

## 2020-06-09 DIAGNOSIS — Z8673 Personal history of transient ischemic attack (TIA), and cerebral infarction without residual deficits: Secondary | ICD-10-CM | POA: Diagnosis not present

## 2020-06-09 DIAGNOSIS — F411 Generalized anxiety disorder: Secondary | ICD-10-CM | POA: Diagnosis not present

## 2020-06-09 DIAGNOSIS — I951 Orthostatic hypotension: Secondary | ICD-10-CM | POA: Diagnosis not present

## 2020-06-20 DIAGNOSIS — G459 Transient cerebral ischemic attack, unspecified: Secondary | ICD-10-CM | POA: Diagnosis not present

## 2020-06-20 DIAGNOSIS — I6782 Cerebral ischemia: Secondary | ICD-10-CM | POA: Diagnosis not present

## 2020-06-20 DIAGNOSIS — H538 Other visual disturbances: Secondary | ICD-10-CM | POA: Diagnosis not present

## 2020-06-20 DIAGNOSIS — G9389 Other specified disorders of brain: Secondary | ICD-10-CM | POA: Diagnosis not present

## 2020-06-20 DIAGNOSIS — I672 Cerebral atherosclerosis: Secondary | ICD-10-CM | POA: Diagnosis not present

## 2020-06-20 DIAGNOSIS — I771 Stricture of artery: Secondary | ICD-10-CM | POA: Diagnosis not present

## 2020-06-20 DIAGNOSIS — I619 Nontraumatic intracerebral hemorrhage, unspecified: Secondary | ICD-10-CM | POA: Diagnosis not present

## 2020-06-20 DIAGNOSIS — J0130 Acute sphenoidal sinusitis, unspecified: Secondary | ICD-10-CM | POA: Diagnosis not present

## 2020-06-20 DIAGNOSIS — Z20822 Contact with and (suspected) exposure to covid-19: Secondary | ICD-10-CM | POA: Diagnosis not present

## 2020-06-20 DIAGNOSIS — G319 Degenerative disease of nervous system, unspecified: Secondary | ICD-10-CM | POA: Diagnosis not present

## 2020-07-02 DIAGNOSIS — G459 Transient cerebral ischemic attack, unspecified: Secondary | ICD-10-CM | POA: Diagnosis not present

## 2020-07-02 DIAGNOSIS — R42 Dizziness and giddiness: Secondary | ICD-10-CM | POA: Diagnosis not present

## 2020-07-08 DIAGNOSIS — R2689 Other abnormalities of gait and mobility: Secondary | ICD-10-CM | POA: Diagnosis not present

## 2020-07-08 DIAGNOSIS — R42 Dizziness and giddiness: Secondary | ICD-10-CM | POA: Diagnosis not present

## 2020-07-25 ENCOUNTER — Emergency Department (HOSPITAL_COMMUNITY): Payer: PPO

## 2020-07-25 ENCOUNTER — Inpatient Hospital Stay (HOSPITAL_COMMUNITY)
Admission: EM | Admit: 2020-07-25 | Discharge: 2020-07-27 | DRG: 069 | Disposition: A | Discharge status: Home / Self Care | Payer: PPO | Attending: Internal Medicine | Admitting: Internal Medicine

## 2020-07-25 ENCOUNTER — Encounter (HOSPITAL_COMMUNITY): Payer: Self-pay

## 2020-07-25 DIAGNOSIS — R531 Weakness: Secondary | ICD-10-CM | POA: Diagnosis present

## 2020-07-25 DIAGNOSIS — E039 Hypothyroidism, unspecified: Secondary | ICD-10-CM | POA: Diagnosis present

## 2020-07-25 DIAGNOSIS — R404 Transient alteration of awareness: Secondary | ICD-10-CM | POA: Diagnosis not present

## 2020-07-25 DIAGNOSIS — Z87891 Personal history of nicotine dependence: Secondary | ICD-10-CM | POA: Diagnosis not present

## 2020-07-25 DIAGNOSIS — Z7902 Long term (current) use of antithrombotics/antiplatelets: Secondary | ICD-10-CM

## 2020-07-25 DIAGNOSIS — R29702 NIHSS score 2: Secondary | ICD-10-CM | POA: Diagnosis present

## 2020-07-25 DIAGNOSIS — R29898 Other symptoms and signs involving the musculoskeletal system: Secondary | ICD-10-CM

## 2020-07-25 DIAGNOSIS — Z7982 Long term (current) use of aspirin: Secondary | ICD-10-CM | POA: Diagnosis not present

## 2020-07-25 DIAGNOSIS — M5416 Radiculopathy, lumbar region: Secondary | ICD-10-CM | POA: Diagnosis present

## 2020-07-25 DIAGNOSIS — G3184 Mild cognitive impairment, so stated: Secondary | ICD-10-CM

## 2020-07-25 DIAGNOSIS — Z9882 Breast implant status: Secondary | ICD-10-CM | POA: Diagnosis not present

## 2020-07-25 DIAGNOSIS — U071 COVID-19: Secondary | ICD-10-CM | POA: Diagnosis present

## 2020-07-25 DIAGNOSIS — F419 Anxiety disorder, unspecified: Secondary | ICD-10-CM | POA: Diagnosis present

## 2020-07-25 DIAGNOSIS — G459 Transient cerebral ischemic attack, unspecified: Principal | ICD-10-CM | POA: Diagnosis present

## 2020-07-25 DIAGNOSIS — Z79890 Hormone replacement therapy (postmenopausal): Secondary | ICD-10-CM

## 2020-07-25 DIAGNOSIS — N3281 Overactive bladder: Secondary | ICD-10-CM | POA: Diagnosis present

## 2020-07-25 DIAGNOSIS — R42 Dizziness and giddiness: Secondary | ICD-10-CM | POA: Diagnosis not present

## 2020-07-25 DIAGNOSIS — F0390 Unspecified dementia without behavioral disturbance: Secondary | ICD-10-CM | POA: Diagnosis present

## 2020-07-25 DIAGNOSIS — R059 Cough, unspecified: Secondary | ICD-10-CM | POA: Diagnosis not present

## 2020-07-25 DIAGNOSIS — I10 Essential (primary) hypertension: Secondary | ICD-10-CM | POA: Diagnosis not present

## 2020-07-25 DIAGNOSIS — E785 Hyperlipidemia, unspecified: Secondary | ICD-10-CM | POA: Diagnosis present

## 2020-07-25 DIAGNOSIS — R200 Anesthesia of skin: Secondary | ICD-10-CM | POA: Diagnosis not present

## 2020-07-25 DIAGNOSIS — K219 Gastro-esophageal reflux disease without esophagitis: Secondary | ICD-10-CM | POA: Diagnosis present

## 2020-07-25 DIAGNOSIS — I6503 Occlusion and stenosis of bilateral vertebral arteries: Secondary | ICD-10-CM | POA: Diagnosis not present

## 2020-07-25 DIAGNOSIS — Z87442 Personal history of urinary calculi: Secondary | ICD-10-CM | POA: Diagnosis not present

## 2020-07-25 DIAGNOSIS — Z981 Arthrodesis status: Secondary | ICD-10-CM

## 2020-07-25 DIAGNOSIS — G8929 Other chronic pain: Secondary | ICD-10-CM | POA: Diagnosis present

## 2020-07-25 DIAGNOSIS — R29818 Other symptoms and signs involving the nervous system: Secondary | ICD-10-CM | POA: Diagnosis not present

## 2020-07-25 DIAGNOSIS — M25552 Pain in left hip: Secondary | ICD-10-CM | POA: Diagnosis not present

## 2020-07-25 DIAGNOSIS — Z79899 Other long term (current) drug therapy: Secondary | ICD-10-CM

## 2020-07-25 DIAGNOSIS — R4781 Slurred speech: Secondary | ICD-10-CM | POA: Diagnosis present

## 2020-07-25 DIAGNOSIS — M25551 Pain in right hip: Secondary | ICD-10-CM | POA: Diagnosis not present

## 2020-07-25 DIAGNOSIS — R410 Disorientation, unspecified: Secondary | ICD-10-CM | POA: Diagnosis not present

## 2020-07-25 DIAGNOSIS — H748X2 Other specified disorders of left middle ear and mastoid: Secondary | ICD-10-CM | POA: Diagnosis not present

## 2020-07-25 DIAGNOSIS — J320 Chronic maxillary sinusitis: Secondary | ICD-10-CM | POA: Diagnosis not present

## 2020-07-25 DIAGNOSIS — I672 Cerebral atherosclerosis: Secondary | ICD-10-CM | POA: Diagnosis not present

## 2020-07-25 DIAGNOSIS — I6389 Other cerebral infarction: Secondary | ICD-10-CM | POA: Diagnosis not present

## 2020-07-25 LAB — COMPREHENSIVE METABOLIC PANEL
ALT: 16 U/L (ref 0–44)
AST: 42 U/L — ABNORMAL HIGH (ref 15–41)
Albumin: 3.6 g/dL (ref 3.5–5.0)
Alkaline Phosphatase: 78 U/L (ref 38–126)
Anion gap: 10 (ref 5–15)
BUN: 10 mg/dL (ref 8–23)
CO2: 23 mmol/L (ref 22–32)
Calcium: 8.5 mg/dL — ABNORMAL LOW (ref 8.9–10.3)
Chloride: 104 mmol/L (ref 98–111)
Creatinine, Ser: 0.88 mg/dL (ref 0.44–1.00)
GFR, Estimated: >60 mL/min (ref >60)
Glucose, Bld: 107 mg/dL — ABNORMAL HIGH (ref 70–99)
Potassium: 3.5 mmol/L (ref 3.5–5.1)
Sodium: 137 mmol/L (ref 135–145)
Total Bilirubin: 1.8 mg/dL — ABNORMAL HIGH (ref 0.3–1.2)
Total Protein: 6.8 g/dL (ref 6.5–8.1)

## 2020-07-25 LAB — CBC
HCT: 43.3 % (ref 36.0–46.0)
Hemoglobin: 13.1 g/dL (ref 12.0–15.0)
MCH: 26.4 pg (ref 26.0–34.0)
MCHC: 30.3 g/dL (ref 30.0–36.0)
MCV: 87.1 fL (ref 80.0–100.0)
Platelets: 200 10*3/uL (ref 150–400)
RBC: 4.97 MIL/uL (ref 3.87–5.11)
RDW: 13.6 % (ref 11.5–15.5)
WBC: 5.7 10*3/uL (ref 4.0–10.5)
nRBC: 0 % (ref 0.0–0.2)

## 2020-07-25 LAB — I-STAT CHEM 8, ED
BUN: 11 mg/dL (ref 8–23)
Calcium, Ion: 1 mmol/L — ABNORMAL LOW (ref 1.15–1.40)
Chloride: 105 mmol/L (ref 98–111)
Creatinine, Ser: 0.8 mg/dL (ref 0.44–1.00)
Glucose, Bld: 102 mg/dL — ABNORMAL HIGH (ref 70–99)
HCT: 40 % (ref 36.0–46.0)
Hemoglobin: 13.6 g/dL (ref 12.0–15.0)
Potassium: 3.5 mmol/L (ref 3.5–5.1)
Sodium: 140 mmol/L (ref 135–145)
TCO2: 26 mmol/L (ref 22–32)

## 2020-07-25 LAB — DIFFERENTIAL
Abs Immature Granulocytes: 0.02 10*3/uL (ref 0.00–0.07)
Basophils Absolute: 0 10*3/uL (ref 0.0–0.1)
Basophils Relative: 0 %
Eosinophils Absolute: 0 10*3/uL (ref 0.0–0.5)
Eosinophils Relative: 1 %
Immature Granulocytes: 0 %
Lymphocytes Relative: 30 %
Lymphs Abs: 1.7 10*3/uL (ref 0.7–4.0)
Monocytes Absolute: 0.7 10*3/uL (ref 0.1–1.0)
Monocytes Relative: 13 %
Neutro Abs: 3.2 10*3/uL (ref 1.7–7.7)
Neutrophils Relative %: 56 %

## 2020-07-25 LAB — SARS CORONAVIRUS 2 BY RT PCR (HOSPITAL ORDER, PERFORMED IN ~~LOC~~ HOSPITAL LAB): SARS Coronavirus 2: POSITIVE — AB

## 2020-07-25 LAB — PROTIME-INR
INR: 1.1 (ref 0.8–1.2)
Prothrombin Time: 14 seconds (ref 11.4–15.2)

## 2020-07-25 LAB — APTT: aPTT: 26 seconds (ref 24–36)

## 2020-07-25 LAB — CBG MONITORING, ED: Glucose-Capillary: 102 mg/dL — ABNORMAL HIGH (ref 70–99)

## 2020-07-25 LAB — ETHANOL: Alcohol, Ethyl (B): <10 mg/dL (ref <10)

## 2020-07-25 LAB — LACTIC ACID, PLASMA: Lactic Acid, Venous: 0.9 mmol/L (ref 0.5–1.9)

## 2020-07-25 MED ORDER — ACETAMINOPHEN 500 MG PO TABS
500.0000 mg | ORAL_TABLET | Freq: Once | ORAL | Status: AC
  Administered 2020-07-25: 500 mg via ORAL
  Filled 2020-07-25: qty 1

## 2020-07-25 MED ORDER — ASPIRIN 81 MG PO CHEW
324.0000 mg | CHEWABLE_TABLET | Freq: Once | ORAL | Status: AC
  Administered 2020-07-25: 324 mg via ORAL

## 2020-07-25 MED ORDER — SODIUM CHLORIDE 0.9% FLUSH
3.0000 mL | Freq: Once | INTRAVENOUS | Status: AC
Start: 2020-07-25 — End: 2020-07-25
  Administered 2020-07-25: 3 mL via INTRAVENOUS

## 2020-07-25 NOTE — Consult Note (Addendum)
Neurology Consult H&P  CC: code stroke  History is obtained from: chart and patient  HPI: Shannon Gutierrez is a 75 y.o. female right handed PMHx as reviewed below, mild dementia with left sided weakness and slurred speech. Patient walks with a walker at baseline and was on the phone with her son, and he thought her words sounded slurred. He called EMS. Per EMS the patient had left sided weakness speech was slurred.  As information came in, apparently the son had gone to see her and she apparently had trouble walking to the door. It was not clear if the patient ambulated with or without her walker.  LKW: 1740 tpa given?: No, mild IR Thrombectomy? No, Modified Rankin Scale: 1-No significant post stroke disability and can perform usual duties with stroke symptoms NIHSS: 2  ROS: A complete ROS was performed and is negative except as noted in the HPI.   Past Medical History:  Diagnosis Date  . Anxiety    takes Paxil daily  . Arthritis    osteoarthritis in knees and back  . Chronic back pain    scoliosis and stenosis and herniated disc  . Degeneration of lumbar intervertebral disc   . Diverticulosis   . GERD (gastroesophageal reflux disease)    takes Nexium daily  . Headache(784.0)    occasionally  . History of bronchitis    last time 61yrs ago  . History of kidney stones   . Hyperlipidemia    takes Pravastatin nightly  . Hypothyroidism    takes SYnthroid daily  . Joint pain   . Joint swelling   . Overactive bladder   . PONV (postoperative nausea and vomiting)   . Urinary urgency   . Weakness    and tingling in right foot     No family history on file.  Social History:  reports that she has quit smoking. Her smoking use included cigarettes. She has a 10.00 pack-year smoking history. She has never used smokeless tobacco. She reports that she does not drink alcohol and does not use drugs.   Prior to Admission medications   Medication Sig Start Date End Date Taking?  Authorizing Provider  acetaminophen (TYLENOL) 500 MG tablet Take 1,000 mg by mouth 2 (two) times daily.    [provider]  acyclovir (ZOVIRAX) 400 MG tablet Take 400 mg by mouth 2 (two) times daily as needed (cold sore).     [provider]  APPLE CIDER VINEGAR PO Take 2 tablets by mouth daily.    [provider]  Biotin 10 MG TABS Take 10 mg by mouth daily.    [provider]  cyclobenzaprine (FLEXERIL) 10 MG tablet Take 1 tablet (10 mg total) by mouth 3 (three) times daily as needed for muscle spasms. 04/17/19   Costella, Darci Current, PA-C  estradiol (ESTRACE) 0.5 MG tablet Take 0.5 mg by mouth daily.    [provider]  levothyroxine (SYNTHROID) 100 MCG tablet Take 100 mcg by mouth See admin instructions. Take 100 mcg by mouth before breakfast Monday - Friday    [provider]  levothyroxine (SYNTHROID) 75 MCG tablet Take 75 mcg by mouth See admin instructions. Take 75 mcg by mouth before breakfast on Saturday and Sunday    [provider]  omeprazole (PRILOSEC) 20 MG capsule Take 20 mg by mouth daily.    [provider]  oxybutynin (DITROPAN-XL) 10 MG 24 hr tablet Take 20 mg by mouth 2 (two) times daily. Taking differently: 20mg   BID x1 month per MD    [provider]  oxyCODONE-acetaminophen (PERCOCET) 7.5-325 MG tablet Take 1 tablet by mouth every 4 (four) hours as needed for severe pain. 04/17/19   Costella, Darci Current, PA-C  PARoxetine (PAXIL) 20 MG tablet Take 20 mg by mouth every morning.    [provider]  pravastatin (PRAVACHOL) 20 MG tablet Take 20 mg by mouth at bedtime.     [provider]  timolol (TIMOPTIC) 0.5 % ophthalmic solution Place 1 drop into both eyes 2 (two) times daily.    [provider]    Exam: Current vital signs: BP (!) 156/65 (BP Location: Left Arm)   Pulse 86   Temp (!) 100.5 F (38.1 C) (Tympanic)   Resp (!) 22   Wt 77 kg   SpO2 94%   BMI 29.60 kg/m    Physical Exam  Constitutional: Appears well-developed and well-nourished.  Psych: Affect appropriate to situation Eyes: No scleral injection HENT: No OP obstruction. Head: Normocephalic.  Cardiovascular: Normal rate and regular rhythm.  Respiratory: Effort normal, symmetric excursions bilaterally, no audible wheezing. GI: Soft.  No distension. There is no tenderness.  Skin: WDI  Neuro: Mental Status: Patient is awake, alert, oriented to person, year, and situation. Patient is no able to give a clear and coherent history. No signs of aphasia or neglect. Fluctuating with perseveration. Cranial Nerves: II: Visual Fields are full. Pupils are equal, round, and reactive to light. III,IV, VI: EOMI without ptosis or diploplia.  V: Facial sensation is symmetric to temperature VII: Facial movement is symmetric.  VIII: hearing is intact to voice X: Uvula elevates symmetrically XI: Shoulder shrug is symmetric. XII: tongue is midline without atrophy or fasciculations.  Motor: Tone is normal. Bulk is normal. 4/5 right lower, 5/5 the rest Sensory: Sensation is decreased to cold in LLE. Deep Tendon Reflexes: 2+ and symmetric in the biceps and patellae. Plantars: Toes are downgoing bilaterally. Cerebellar: FNF and HKS are intact bilaterally.  I have reviewed labs in epic and the pertinent results are: No results at time of examination.  I have reviewed the images obtained: NCT head showed no acute ischemic changes   Assessment: Shannon Gutierrez is a 75 y.o. female right handed PMHx as above, mild dementia recent removal of L2-3 hardware 05/2020 with mild right lower extremity weakness and left lower extremity decreased cold sensation. The patient added clopidogrel was recently stopped and her personal records indicate donepezil may have been decreased. Her baseline is not clear and cannot exclude TIA with resolution of symptoms to baseline versus acute ischemic stroke in mixed picture  lumbar spinal pathology.  Recommended aspirin 324mg  now.  Impression:  Mild right lower extremity weakness Left lower extremity decreased cold sensation. NIHSS 2 Lumbar spinal pathology Mild dementia HLD   Plan: - MRI brain without contrast. - Recommend vascular imaging with MRA head and neck. - Recommend TTE. - Recommend labs: HbA1c, lipid panel, TSH. - Recommend Statin if LDL > 70 - Aspirin 81mg  daily. - Permissive hypertension first 24 h < 220/110.  - Telemetry monitoring for arrhythmia. - Recommend bedside Swallow screen. - Recommend Stroke education. - Recommend PT/OT/SLP consult. - Recommend metabolic/infectious workup with CBC, CMP, UA with UCx, CXR, CK, serum lactate.  This patient is critically ill and at significant risk of neurological worsening, death and care requires constant monitoring of vital signs, hemodynamics,respiratory and cardiac monitoring, neurological assessment, discussion with family, other specialists and medical decision making of high complexity. I spent  73 minutes of neurocritical care time  in the care of  this patient. This was time spent independent of any time provided by nurse practitioner or PA.  Electronically signed by:  Marisue Humble, MD Page: [phone] 07/25/2020, 8:57 PM

## 2020-07-25 NOTE — ED Triage Notes (Signed)
Pt comes via [facility] from home, [place] 1730, pt was on the phone with her son and began to not make sense, he went over to check on her, pt had AMS, L sided weakness, L arm drift, slurred speech and asphasia. Pt reports that she also had a fall this morning and has pain in her L hip from that fall.

## 2020-07-25 NOTE — ED Provider Notes (Signed)
[facility] EMERGENCY DEPARTMENT Provider Note   CSN: [id number] Arrival date & time: 07/25/20  2028  An emergency department physician performed an initial assessment on this suspected stroke patient at 2027.  History Chief Complaint  Patient presents with  . Code Stroke    ARLEIGH DICOLA is a 75 y.o. female.  The history is provided by the patient, the EMS personnel and medical records.  Neurologic Problem This is a new problem. The current episode started 3 to 5 hours ago. The problem occurs constantly. The problem has not changed (waxing and waning) since onset.Pertinent negatives include no chest pain, no abdominal pain, no headaches and no shortness of breath. Nothing aggravates the symptoms. Nothing relieves the symptoms. She has tried nothing for the symptoms.       Past Medical History:  Diagnosis Date  . Anxiety    takes Paxil daily  . Arthritis    osteoarthritis in knees and back  . Chronic back pain    scoliosis and stenosis and herniated disc  . Degeneration of lumbar intervertebral disc   . Diverticulosis   . GERD (gastroesophageal reflux disease)    takes Nexium daily  . Headache(784.0)    occasionally  . History of bronchitis    last time 10yrs ago  . History of kidney stones   . Hyperlipidemia    takes Pravastatin nightly  . Hypothyroidism    takes SYnthroid daily  . Joint pain   . Joint swelling   . Overactive bladder   . PONV (postoperative nausea and vomiting)   . Urinary urgency   . Weakness    and tingling in right foot    Patient Active Problem List   Diagnosis Date Noted  . TIA (transient ischemic attack) 07/26/2020  . DDD (degenerative disc disease), lumbar 04/16/2019  . Spinal stenosis of lumbar region 10/03/2013  . Radiculopathy of lumbar region 04/20/2012    Past Surgical History:  Procedure Laterality Date  . ABDOMINAL HYSTERECTOMY    . ANTERIOR LAT LUMBAR FUSION N/A 04/16/2019   Procedure: LUMBAR  TWO-THREE ANTERIOR LATERAL INTERBDOY FUSION;  Surgeon: Donalee Citrin, MD;  Location: [facility] OR;  Service: Neurosurgery;  Laterality: N/A;  . BACK SURGERY  2013   fusion  . BREAST SURGERY  1980   augmentation  . CARPAL TUNNEL RELEASE Left    2010 and then right in 2012  . COLONOSCOPY    . ESOPHAGOGASTRODUODENOSCOPY    . EYE SURGERY    . HAND SURGERY     CMP joint  . HARDWARE REMOVAL N/A 04/16/2019   Procedure: EXTENSION OF FUSION TO LUMBAR TWO -THREE  WITH REMOVAL OF HARDWARE AT LUMBAR FIVE-SACRAL ONE;  Surgeon: Donalee Citrin, MD;  Location: [facility] OR;  Service: Neurosurgery;  Laterality: N/A;  . JOINT REPLACEMENT     both hands  . TONSILLECTOMY  1980  . TOTAL THYROIDECTOMY  1981     OB History   No obstetric history on file.     History reviewed. No pertinent family history.  Social History   Tobacco Use  . Smoking status: Former Smoker    Packs/day: 0.50    Years: 20.00    Pack years: 10.00    Types: Cigarettes  . Smokeless tobacco: Never Used  . Tobacco comment: quit smoking 74yrs ago  Substance Use Topics  . Alcohol use: No  . Drug use: No    Home Medications Prior to Admission medications   Medication Sig Start Date End Date Taking?  Authorizing Provider  fludrocortisone (FLORINEF) 0.1 MG tablet Take 0.05 mg by mouth daily. 05/09/20  Yes [provider]  promethazine-dextromethorphan (PROMETHAZINE-DM) 6.25-15 MG/5ML syrup Take 5 mLs by mouth 4 (four) times daily as needed for cough. 07/22/20 07/29/20 Yes [provider]  timolol (TIMOPTIC) 0.25 % ophthalmic solution Place 1 drop into both eyes in the morning and at bedtime. 03/12/19  Yes [provider]  acetaminophen (TYLENOL) 500 MG tablet Take 1,000 mg by mouth 2 (two) times daily.    [provider]  acyclovir (ZOVIRAX) 400 MG tablet Take 400 mg by mouth 2 (two) times daily as needed (cold sore).     [provider]  APPLE CIDER VINEGAR PO Take 2 tablets by mouth daily.    [provider]  aspirin 81 MG chewable tablet Chew 81 mg by mouth daily. 07/02/20   [provider]  atorvastatin (LIPITOR) 40 MG tablet Take 40 mg by mouth daily. 05/09/20   [provider]  Biotin 10 MG TABS Take 10 mg by mouth daily.    [provider]  clopidogrel (PLAVIX) 75 MG tablet Take 75 mg by mouth daily. 06/20/20   [provider]  cyclobenzaprine (FLEXERIL) 10 MG tablet Take 1 tablet (10 mg total) by mouth 3 (three) times daily as needed for muscle spasms. 04/17/19   Costella, Darci Current, PA-C  doxycycline (VIBRAMYCIN) 100 MG capsule Take 100 mg by mouth See admin instructions. Bid x 7 days 07/22/20   [provider]  estradiol (ESTRACE) 0.5 MG tablet Take 0.5 mg by mouth daily.    [provider]  levothyroxine (SYNTHROID) 100 MCG tablet Take 100 mcg by mouth See admin instructions. Take 100 mcg by mouth before breakfast Monday - Friday    [provider]  levothyroxine (SYNTHROID) 75 MCG tablet Take 75 mcg by mouth See admin instructions. Take 75 mcg by mouth before breakfast on Saturday and Sunday    [provider]  omeprazole (PRILOSEC) 20 MG capsule Take 20 mg by mouth daily.    [provider]  oxybutynin (DITROPAN-XL) 10 MG 24 hr tablet Take 20 mg by mouth 2 (two) times daily. Taking differently: 20mg  BID x1 month per MD    [provider]  oxyCODONE-acetaminophen (PERCOCET) 7.5-325 MG tablet Take 1 tablet by mouth every 4 (four) hours as needed for severe pain. 04/17/19   Costella, 04/19/19, PA-C  PARoxetine (PAXIL) 20 MG tablet Take 20 mg by mouth every morning.    [provider]  pravastatin (PRAVACHOL) 20 MG tablet Take 20 mg by mouth at bedtime.     [provider]    Allergies    Keflex [cephalexin], Metaxalone, and Morphine and related  Review of Systems   Review of Systems  Unable to perform ROS: Dementia  Respiratory: Negative for shortness of breath.    Cardiovascular: Negative for chest pain.  Gastrointestinal: Negative for abdominal pain.  Neurological: Positive for speech difficulty and weakness. Negative for headaches.    Physical Exam Updated Vital Signs BP (!) 102/53   Pulse 75   Temp (!) 100.5 F (38.1 C) (Tympanic)   Resp 19   Wt 77 kg   SpO2 94%   BMI 29.60 kg/m   Physical Exam Vitals and nursing note reviewed.  Constitutional:      General: She is not in acute distress.    Appearance: She is well-developed and well-nourished.  HENT:     Head: Normocephalic and atraumatic.  Eyes:     Conjunctiva/sclera: Conjunctivae normal.  Cardiovascular:     Rate and Rhythm: Normal rate and regular rhythm.     Heart sounds: No murmur heard.   Pulmonary:     Effort: Pulmonary effort is normal. No respiratory distress.     Breath sounds: Normal breath sounds.  Abdominal:     Palpations: Abdomen is soft.     Tenderness: There is no abdominal tenderness.  Musculoskeletal:        General: No edema.     Cervical back: Neck supple.     Right hip: Tenderness present. Normal range of motion.     Left hip: Tenderness present. Normal range of motion.  Skin:    General: Skin is warm and dry.  Neurological:     Mental Status: She is alert. She is confused.     GCS: GCS eye subscore is 4. GCS verbal subscore is 5. GCS motor subscore is 6.     Cranial Nerves: Dysarthria (mild) present. No cranial nerve deficit.     Sensory: Sensation is intact.     Motor: Motor function is intact. No weakness.  Psychiatric:        Mood and Affect: Mood and affect normal.     ED Results / Procedures / Treatments   Labs (all labs ordered are listed, but only abnormal results are displayed) Labs Reviewed  SARS CORONAVIRUS 2 BY RT PCR (HOSPITAL ORDER, PERFORMED IN [facility]) - Abnormal; Notable for the following components:      Result Value   SARS Coronavirus 2 POSITIVE (*)    All other components within normal limits   COMPREHENSIVE METABOLIC PANEL - Abnormal; Notable for the following components:   Glucose, Bld 107 (*)    Calcium 8.5 (*)    AST 42 (*)    Total Bilirubin 1.8 (*)    All other components within normal limits  URINALYSIS, ROUTINE W REFLEX MICROSCOPIC - Abnormal; Notable for the following components:   Color, Urine AMBER (*)    Glucose, UA 50 (*)    All other components within normal limits  C-REACTIVE PROTEIN - Abnormal; Notable for the following components:   CRP 1.2 (*)    All other components within normal limits  I-STAT CHEM 8, ED - Abnormal; Notable for the following components:   Glucose, Bld 102 (*)    Calcium, Ion 1.00 (*)    All other components within normal limits  CBG MONITORING, ED - Abnormal; Notable for the following components:   Glucose-Capillary 102 (*)    All other components within normal limits  URINE CULTURE  CULTURE, BLOOD (ROUTINE X 2)  CULTURE, BLOOD (ROUTINE X 2)  PROTIME-INR  APTT  CBC  DIFFERENTIAL  RAPID URINE DRUG SCREEN, HOSP PERFORMED  ETHANOL  LACTIC ACID, PLASMA  LACTIC ACID, PLASMA  D-DIMER, QUANTITATIVE (NOT AT [facility])  LACTATE DEHYDROGENASE  FERRITIN  FIBRINOGEN  TRIGLYCERIDES  PROCALCITONIN  HEMOGLOBIN A1C  LIPID PANEL    EKG None  Radiology DG Chest Port 1 View  Result Date: 07/25/2020 CLINICAL DATA:  Cough and congestion EXAM: PORTABLE CHEST 1 VIEW COMPARISON:  None. FINDINGS: Cardiac shadow is at the upper limits of normal in size accentuated by the frontal technique. Bilateral breast implants are noted. The lungs are well aerated bilaterally with mild interstitial changes likely of a chronic nature. No focal confluent infiltrate is seen. Old rib fractures are noted on the right. No other bony abnormality is seen. IMPRESSION: Chronic interstitial  changes without acute abnormality. Electronically Signed   By: Alcide CleverMark  Lukens M.D.   On: 07/25/2020 22:47   DG Hip Unilat W or Wo Pelvis 2-3 Views Left  Result Date: 07/25/2020 CLINICAL  DATA:  Recent fall with left hip pain EXAM: DG HIP (WITH OR WITHOUT PELVIS) 3V LEFT COMPARISON:  None. FINDINGS: Pelvic ring is intact. Postsurgical changes are noted in the lower lumbar spine. Mild degenerative changes of left hip joint are noted. No acute fracture or dislocation is seen. No soft tissue abnormality is noted. IMPRESSION: No acute abnormality noted. Electronically Signed   By: Alcide CleverMark  Lukens M.D.   On: 07/25/2020 22:46   DG Hip Unilat W or Wo Pelvis 2-3 Views Right  Result Date: 07/25/2020 CLINICAL DATA:  Recent fall with right hip pain, initial encounter EXAM: DG HIP (WITH OR WITHOUT PELVIS) 3V RIGHT COMPARISON:  None. FINDINGS: Pelvic ring is intact. No acute fracture or dislocation is noted. No soft tissue abnormality is seen. Postsurgical changes in the lumbar spine are noted. IMPRESSION: No acute abnormality noted. Electronically Signed   By: Alcide CleverMark  Lukens M.D.   On: 07/25/2020 22:48   CT HEAD CODE STROKE WO CONTRAST  Result Date: 07/25/2020 CLINICAL DATA:  Code stroke. Initial evaluation for acute left-sided weakness, slurred speech. EXAM: CT HEAD WITHOUT CONTRAST TECHNIQUE: Contiguous axial images were obtained from the base of the skull through the vertex without intravenous contrast. COMPARISON:  Prior CT from 09/17/2019. FINDINGS: Brain: Moderately advanced age-related cerebral atrophy with chronic small vessel ischemic disease. No acute intracranial hemorrhage. No acute large vessel territory infarct. No mass lesion, midline shift or mass effect. No hydrocephalus or extra-axial fluid collection. Vascular: No hyperdense vessel. Scattered vascular calcifications noted within the carotid siphons. Skull: Scalp soft tissues and calvarium within normal limits. Sinuses/Orbits: Globes and orbital soft tissues demonstrate no acute finding. Chronic right maxillary sinusitis noted. Additional scattered mild mucosal thickening noted elsewhere within the paranasal sinuses. No mastoid effusion.  Other: None. ASPECTS [facility]([place] Stroke Program Early CT Score) - Ganglionic level infarction (caudate, lentiform nuclei, internal capsule, insula, M1-M3 cortex): 7 - Supraganglionic infarction (M4-M6 cortex): 3 Total score (0-10 with 10 being normal): 10 IMPRESSION: 1. No acute intracranial infarct or other abnormality. 2. ASPECTS is 10. 3. Moderately advanced cerebral atrophy with chronic small vessel ischemic disease. These results were communicated to Dr. Thomasena Edisollins at 8:49 pmon 1/21/2022by text page via the [facility] messaging system. Electronically Signed   By: Rise MuBenjamin  McClintock M.D.   On: 07/25/2020 20:51    Procedures Procedures (including critical care time)  Medications Ordered in ED Medications  acetaminophen (TYLENOL) tablet 1,000 mg (has no administration in time range)  oxyCODONE-acetaminophen (PERCOCET) 7.5-325 MG per tablet 1 tablet (has no administration in time range)  doxycycline (VIBRA-TABS) tablet 100 mg (has no administration in time range)  atorvastatin (LIPITOR) tablet 40 mg (has no administration in time range)  PARoxetine (PAXIL) tablet 20 mg (has no administration in time range)  estradiol (ESTRACE) tablet 0.5 mg (has no administration in time range)  fludrocortisone (FLORINEF) tablet 0.05 mg (has no administration in time range)  levothyroxine (SYNTHROID) tablet 100 mcg (has no administration in time range)  levothyroxine (SYNTHROID) tablet 75 mcg (has no administration in time range)  pantoprazole (PROTONIX) EC tablet 40 mg (has no administration in time range)  clopidogrel (PLAVIX) tablet 75 mg (has no administration in time range)   stroke: mapping our early stages of recovery book (has no administration in time range)  0.9 %  sodium chloride infusion (has no administration in time range)  acetaminophen (TYLENOL) tablet 650 mg (has no administration in time range)    Or  acetaminophen (TYLENOL) 160 MG/5ML solution 650 mg (has no administration in time range)    Or   acetaminophen (TYLENOL) suppository 650 mg (has no administration in time range)  enoxaparin (LOVENOX) injection 40 mg (has no administration in time range)  senna-docusate (Senokot-S) tablet 1 tablet (has no administration in time range)  remdesivir 200 mg in sodium chloride 0.9% 250 mL IVPB (has no administration in time range)    Followed by  remdesivir 100 mg in sodium chloride 0.9 % 100 mL IVPB (has no administration in time range)  oxybutynin (DITROPAN-XL) 24 hr tablet 20 mg (has no administration in time range)  sodium chloride flush (NS) 0.9 % injection 3 mL (3 mLs Intravenous Given 07/25/20 2053)  aspirin chewable tablet 324 mg (324 mg Oral Given 07/25/20 2055)  acetaminophen (TYLENOL) tablet 500 mg (500 mg Oral Given 07/25/20 2326)    ED Course  I have reviewed the triage vital signs and the nursing notes.  Pertinent labs & imaging results that were available during my care of the patient were reviewed by me and considered in my medical decision making (see chart for details).    MDM Rules/Calculators/A&P                          This is a 75 year old, female, who presents to the ED for code stroke. She has mild dementia, reports she lives alone with her dog. She developed left sided weakness and slurred speech. Reported she was talking to her son who thought her speech sounded off. On EMS arrival she had some left sided weakness and slurred speech.  She was hemodynamically stable on arrival in no respiratory distress. She went for code stroke imaging that showed no LVO findings. On arrival back to acute care room, she was noted to be coughing and febrile. Infectious work-up initiated. Covid returned positive.   On my exam she has equal strength and sensation bilaterally. She does have some intermittent and mild dysarthria and appears mildly confused at times, but answers questions appropriately and is oriented. She has some bilateral hip tenderness, reports she fell earlier today.  No signs of trauma or tenderness otherwise on secondary exam.   Her exam and history appears to be more related to delirium which could be exacerbated by her Covid infection in the background of mild dementia.  Neurology would like to complete stroke work-up with MRI and admission. Hospitalist consulted for admission. Patient is vaccinated, is not requiring oxygen.  Final Clinical Impression(s) / ED Diagnoses Final diagnoses:  COVID-19    Rx / DC Orders ED Discharge Orders    None       Kathleen Lime, MD 07/26/20 0155    Lorre Nick, MD 07/27/20 2242

## 2020-07-25 NOTE — ED Notes (Signed)
Pt son Virgil Slinger [phone]

## 2020-07-25 NOTE — ED Provider Notes (Addendum)
I saw and evaluated the patient, reviewed the resident's note and I agree with the findings and plan.  EKG:     ED ECG REPORT   Date: 07/25/2020  Rate: 87  Rhythm: normal sinus rhythm  QRS Axis: normal  Intervals: normal  ST/T Wave abnormalities: normal  Conduction Disutrbances:none  Narrative Interpretation:   Old EKG Reviewed: none available  I have personally reviewed the EKG tracing and agree with the computerized printout as noted.  75 year old female presents with lower extremity weakness.  Recent history of L2 hardware removal.  Code stroke initiated prior to arrival here.  Seen by neurology here on arrival.  Head CT reviewed by them and without acute abnormalities.  Recommend inpatient stroke work-up   Lorre Nick, MD 07/25/20 2052    Lorre Nick, MD 07/25/20 2056

## 2020-07-26 ENCOUNTER — Observation Stay (HOSPITAL_COMMUNITY): Payer: PPO

## 2020-07-26 ENCOUNTER — Inpatient Hospital Stay (HOSPITAL_COMMUNITY): Payer: PPO

## 2020-07-26 DIAGNOSIS — Z9882 Breast implant status: Secondary | ICD-10-CM | POA: Diagnosis not present

## 2020-07-26 DIAGNOSIS — I6389 Other cerebral infarction: Secondary | ICD-10-CM

## 2020-07-26 DIAGNOSIS — G3184 Mild cognitive impairment, so stated: Secondary | ICD-10-CM | POA: Diagnosis not present

## 2020-07-26 DIAGNOSIS — I672 Cerebral atherosclerosis: Secondary | ICD-10-CM | POA: Diagnosis not present

## 2020-07-26 DIAGNOSIS — J320 Chronic maxillary sinusitis: Secondary | ICD-10-CM | POA: Diagnosis not present

## 2020-07-26 DIAGNOSIS — Z87442 Personal history of urinary calculi: Secondary | ICD-10-CM | POA: Diagnosis not present

## 2020-07-26 DIAGNOSIS — Z981 Arthrodesis status: Secondary | ICD-10-CM | POA: Diagnosis not present

## 2020-07-26 DIAGNOSIS — R531 Weakness: Secondary | ICD-10-CM | POA: Diagnosis present

## 2020-07-26 DIAGNOSIS — M5416 Radiculopathy, lumbar region: Secondary | ICD-10-CM | POA: Diagnosis present

## 2020-07-26 DIAGNOSIS — H748X2 Other specified disorders of left middle ear and mastoid: Secondary | ICD-10-CM | POA: Diagnosis not present

## 2020-07-26 DIAGNOSIS — N3281 Overactive bladder: Secondary | ICD-10-CM | POA: Diagnosis present

## 2020-07-26 DIAGNOSIS — R200 Anesthesia of skin: Secondary | ICD-10-CM | POA: Diagnosis not present

## 2020-07-26 DIAGNOSIS — G459 Transient cerebral ischemic attack, unspecified: Secondary | ICD-10-CM | POA: Diagnosis present

## 2020-07-26 DIAGNOSIS — K219 Gastro-esophageal reflux disease without esophagitis: Secondary | ICD-10-CM | POA: Diagnosis present

## 2020-07-26 DIAGNOSIS — Z79890 Hormone replacement therapy (postmenopausal): Secondary | ICD-10-CM | POA: Diagnosis not present

## 2020-07-26 DIAGNOSIS — E785 Hyperlipidemia, unspecified: Secondary | ICD-10-CM | POA: Diagnosis present

## 2020-07-26 DIAGNOSIS — I6503 Occlusion and stenosis of bilateral vertebral arteries: Secondary | ICD-10-CM | POA: Diagnosis not present

## 2020-07-26 DIAGNOSIS — Z7982 Long term (current) use of aspirin: Secondary | ICD-10-CM | POA: Diagnosis not present

## 2020-07-26 DIAGNOSIS — G8929 Other chronic pain: Secondary | ICD-10-CM | POA: Diagnosis present

## 2020-07-26 DIAGNOSIS — U071 COVID-19: Secondary | ICD-10-CM | POA: Diagnosis present

## 2020-07-26 DIAGNOSIS — F0390 Unspecified dementia without behavioral disturbance: Secondary | ICD-10-CM | POA: Diagnosis present

## 2020-07-26 DIAGNOSIS — Z79899 Other long term (current) drug therapy: Secondary | ICD-10-CM | POA: Diagnosis not present

## 2020-07-26 DIAGNOSIS — E039 Hypothyroidism, unspecified: Secondary | ICD-10-CM | POA: Diagnosis present

## 2020-07-26 DIAGNOSIS — R4781 Slurred speech: Secondary | ICD-10-CM | POA: Diagnosis present

## 2020-07-26 DIAGNOSIS — R29898 Other symptoms and signs involving the musculoskeletal system: Secondary | ICD-10-CM | POA: Diagnosis not present

## 2020-07-26 DIAGNOSIS — R29702 NIHSS score 2: Secondary | ICD-10-CM | POA: Diagnosis present

## 2020-07-26 DIAGNOSIS — Z87891 Personal history of nicotine dependence: Secondary | ICD-10-CM | POA: Diagnosis not present

## 2020-07-26 DIAGNOSIS — F419 Anxiety disorder, unspecified: Secondary | ICD-10-CM | POA: Diagnosis present

## 2020-07-26 DIAGNOSIS — Z7902 Long term (current) use of antithrombotics/antiplatelets: Secondary | ICD-10-CM | POA: Diagnosis not present

## 2020-07-26 LAB — URINALYSIS, ROUTINE W REFLEX MICROSCOPIC
Bilirubin Urine: NEGATIVE
Glucose, UA: 50 mg/dL — AB
Hgb urine dipstick: NEGATIVE
Ketones, ur: NEGATIVE mg/dL
Leukocytes,Ua: NEGATIVE
Nitrite: NEGATIVE
Protein, ur: NEGATIVE mg/dL
Specific Gravity, Urine: 1.017 (ref 1.005–1.030)
pH: 5 (ref 5.0–8.0)

## 2020-07-26 LAB — FERRITIN: Ferritin: 26 ng/mL (ref 11–307)

## 2020-07-26 LAB — RAPID URINE DRUG SCREEN, HOSP PERFORMED
Amphetamines: NOT DETECTED
Barbiturates: NOT DETECTED
Benzodiazepines: NOT DETECTED
Cocaine: NOT DETECTED
Opiates: NOT DETECTED
Tetrahydrocannabinol: NOT DETECTED

## 2020-07-26 LAB — HEMOGLOBIN A1C
Hgb A1c MFr Bld: 5.4 % (ref 4.8–5.6)
Mean Plasma Glucose: 108.28 mg/dL

## 2020-07-26 LAB — LIPID PANEL
Cholesterol: 78 mg/dL (ref 0–200)
HDL: 24 mg/dL — ABNORMAL LOW (ref >40)
LDL Cholesterol: 40 mg/dL (ref 0–99)
Total CHOL/HDL Ratio: 3.3 RATIO
Triglycerides: 68 mg/dL (ref <150)
VLDL: 14 mg/dL (ref 0–40)

## 2020-07-26 LAB — LACTIC ACID, PLASMA: Lactic Acid, Venous: 0.8 mmol/L (ref 0.5–1.9)

## 2020-07-26 LAB — D-DIMER, QUANTITATIVE: D-Dimer, Quant: 0.46 ug/mL-FEU (ref 0.00–0.50)

## 2020-07-26 LAB — TRIGLYCERIDES: Triglycerides: 85 mg/dL (ref <150)

## 2020-07-26 LAB — C-REACTIVE PROTEIN: CRP: 1.2 mg/dL — ABNORMAL HIGH (ref <1.0)

## 2020-07-26 LAB — FIBRINOGEN: Fibrinogen: 359 mg/dL (ref 210–475)

## 2020-07-26 LAB — PROCALCITONIN: Procalcitonin: <0.1 ng/mL

## 2020-07-26 LAB — LACTATE DEHYDROGENASE: LDH: 174 U/L (ref 98–192)

## 2020-07-26 MED ORDER — SODIUM CHLORIDE 0.9 % IV SOLN
100.0000 mg | Freq: Every day | INTRAVENOUS | Status: DC
  Administered 2020-07-27: 100 mg via INTRAVENOUS
  Filled 2020-07-26: qty 20

## 2020-07-26 MED ORDER — LEVOTHYROXINE SODIUM 100 MCG PO TABS: 100.0000 ug | ORAL_TABLET | ORAL | Status: DC

## 2020-07-26 MED ORDER — ESTRADIOL 1 MG PO TABS
0.5000 mg | ORAL_TABLET | Freq: Every day | ORAL | Status: DC
  Administered 2020-07-26 – 2020-07-27 (×2): 0.5 mg via ORAL
  Filled 2020-07-26 (×2): qty 0.5

## 2020-07-26 MED ORDER — ACETAMINOPHEN 325 MG PO TABS: 650.0000 mg | ORAL_TABLET | ORAL | Status: DC | PRN

## 2020-07-26 MED ORDER — STROKE: EARLY STAGES OF RECOVERY BOOK
Freq: Once | Status: AC
  Filled 2020-07-26: qty 1

## 2020-07-26 MED ORDER — PERFLUTREN LIPID MICROSPHERE
1.0000 mL | INTRAVENOUS | Status: AC | PRN
  Administered 2020-07-26: 2 mL via INTRAVENOUS
  Filled 2020-07-26: qty 10

## 2020-07-26 MED ORDER — IOHEXOL 350 MG/ML SOLN
65.0000 mL | Freq: Once | INTRAVENOUS | Status: AC | PRN
  Administered 2020-07-26: 65 mL via INTRAVENOUS

## 2020-07-26 MED ORDER — OXYBUTYNIN CHLORIDE ER 5 MG PO TB24
20.0000 mg | ORAL_TABLET | Freq: Every day | ORAL | Status: DC
  Administered 2020-07-26: 20 mg via ORAL
  Filled 2020-07-26: qty 2
  Filled 2020-07-26: qty 4

## 2020-07-26 MED ORDER — ACETAMINOPHEN 160 MG/5ML PO SOLN: 650.0000 mg | ORAL | Status: DC | PRN

## 2020-07-26 MED ORDER — ACETAMINOPHEN 650 MG RE SUPP: 650.0000 mg | RECTAL | Status: DC | PRN

## 2020-07-26 MED ORDER — ACETAMINOPHEN 500 MG PO TABS
1000.0000 mg | ORAL_TABLET | Freq: Two times a day (BID) | ORAL | Status: DC
  Administered 2020-07-26 – 2020-07-27 (×4): 1000 mg via ORAL
  Filled 2020-07-26 (×4): qty 2

## 2020-07-26 MED ORDER — SODIUM CHLORIDE 0.9 % IV SOLN: INTRAVENOUS | Status: DC

## 2020-07-26 MED ORDER — ASPIRIN EC 81 MG PO TBEC
81.0000 mg | DELAYED_RELEASE_TABLET | Freq: Every day | ORAL | Status: DC
  Administered 2020-07-26 – 2020-07-27 (×2): 81 mg via ORAL
  Filled 2020-07-26 (×2): qty 1

## 2020-07-26 MED ORDER — OXYCODONE-ACETAMINOPHEN 7.5-325 MG PO TABS: 1.0000 | ORAL_TABLET | ORAL | Status: DC | PRN

## 2020-07-26 MED ORDER — ATORVASTATIN CALCIUM 40 MG PO TABS
40.0000 mg | ORAL_TABLET | Freq: Every day | ORAL | Status: DC
  Administered 2020-07-26 – 2020-07-27 (×2): 40 mg via ORAL
  Filled 2020-07-26 (×2): qty 1

## 2020-07-26 MED ORDER — PANTOPRAZOLE SODIUM 40 MG PO TBEC
40.0000 mg | DELAYED_RELEASE_TABLET | Freq: Every day | ORAL | Status: DC
  Administered 2020-07-26 – 2020-07-27 (×2): 40 mg via ORAL
  Filled 2020-07-26 (×3): qty 1

## 2020-07-26 MED ORDER — CLOPIDOGREL BISULFATE 75 MG PO TABS
75.0000 mg | ORAL_TABLET | Freq: Every day | ORAL | Status: DC
Start: 2020-07-26 — End: 2020-07-26
  Administered 2020-07-26: 75 mg via ORAL
  Filled 2020-07-26: qty 1

## 2020-07-26 MED ORDER — DM-GUAIFENESIN ER 30-600 MG PO TB12
1.0000 | ORAL_TABLET | Freq: Two times a day (BID) | ORAL | Status: DC | PRN
  Filled 2020-07-26: qty 1

## 2020-07-26 MED ORDER — LEVOTHYROXINE SODIUM 75 MCG PO TABS
75.0000 ug | ORAL_TABLET | ORAL | Status: DC
  Administered 2020-07-26 – 2020-07-27 (×2): 75 ug via ORAL
  Filled 2020-07-26 (×2): qty 1

## 2020-07-26 MED ORDER — PAROXETINE HCL 20 MG PO TABS
20.0000 mg | ORAL_TABLET | Freq: Every day | ORAL | Status: DC
  Administered 2020-07-26 – 2020-07-27 (×2): 20 mg via ORAL
  Filled 2020-07-26 (×2): qty 1

## 2020-07-26 MED ORDER — FLUDROCORTISONE ACETATE 0.1 MG PO TABS
0.0500 mg | ORAL_TABLET | Freq: Every day | ORAL | Status: DC
  Administered 2020-07-26 – 2020-07-27 (×2): 0.05 mg via ORAL
  Filled 2020-07-26 (×2): qty 0.5

## 2020-07-26 MED ORDER — SENNOSIDES-DOCUSATE SODIUM 8.6-50 MG PO TABS: 1.0000 | ORAL_TABLET | Freq: Every evening | ORAL | Status: DC | PRN

## 2020-07-26 MED ORDER — SODIUM CHLORIDE 0.9 % IV SOLN
200.0000 mg | Freq: Once | INTRAVENOUS | Status: AC
  Administered 2020-07-26: 200 mg via INTRAVENOUS
  Filled 2020-07-26: qty 40

## 2020-07-26 MED ORDER — ENOXAPARIN SODIUM 40 MG/0.4ML ~~LOC~~ SOLN
40.0000 mg | SUBCUTANEOUS | Status: DC
  Filled 2020-07-26: qty 0.4

## 2020-07-26 MED ORDER — IPRATROPIUM-ALBUTEROL 20-100 MCG/ACT IN AERS
1.0000 | INHALATION_SPRAY | Freq: Four times a day (QID) | RESPIRATORY_TRACT | Status: DC
  Administered 2020-07-26 – 2020-07-27 (×4): 1 via RESPIRATORY_TRACT
  Filled 2020-07-26 (×2): qty 4

## 2020-07-26 MED ORDER — DOXYCYCLINE HYCLATE 100 MG PO TABS
100.0000 mg | ORAL_TABLET | Freq: Two times a day (BID) | ORAL | Status: DC
  Administered 2020-07-26 – 2020-07-27 (×4): 100 mg via ORAL
  Filled 2020-07-26 (×4): qty 1

## 2020-07-26 MED ORDER — OXYBUTYNIN CHLORIDE ER 10 MG PO TB24
20.0000 mg | ORAL_TABLET | Freq: Two times a day (BID) | ORAL | Status: DC
Start: 2020-07-26 — End: 2020-07-26
  Filled 2020-07-26: qty 2

## 2020-07-26 NOTE — H&P (Signed)
History and Physical    Shannon Gutierrez KLK:[id number] DOB: 03-30-1946 DOA: 07/25/2020  PCP: Cheron Schaumann., MD (Confirm with patient/family/NH records and if not entered, this has to be entered at [facility] point of entry) Patient coming from: home  I have personally briefly reviewed patient's old medical records in [facility]  Chief Complaint: slurred speech, right leg weakness, change in sensation left leg  HPI: Shannon Gutierrez is a 75 y.o. female with medical history significant of Lumber disc disease s/p surgery x 4, GERD, HLD, Hypothyroidism. Today her son noticed that her speech was slurred. He went to her home and she seemed to have right leg weakness. Due to these sypmtoms EMS was activated and she was transferred to [facility].   ED Course: T 100.5  129/52  75  RR 21. Exam was non-focal by ED-R. Lab: Covid POSITIVE (last negative test in Dec '21), Cmet nl, CBCD nl, LDH 174, Tgy 85, ferritin 26, CRP1.2  Lacitic acid 0.8, 0.9, fibrinogen 359, UA negative. CT head w/o acute findings. Neurology saw the patient in consult: non-focal exam. Opined symptoms from lumbar disc dz vs resolved TIA. Neuro recommended admission by [facility] to complete stroke eval for ishcemic TIA now resolved.  Review of Systems: As per HPI otherwise 10 point review of systems negative.    Past Medical History:  Diagnosis Date  . Anxiety    takes Paxil daily  . Arthritis    osteoarthritis in knees and back  . Chronic back pain    scoliosis and stenosis and herniated disc  . Degeneration of lumbar intervertebral disc   . Diverticulosis   . GERD (gastroesophageal reflux disease)    takes Nexium daily  . Headache(784.0)    occasionally  . History of bronchitis    last time 29yrs ago  . History of kidney stones   . Hyperlipidemia    takes Pravastatin nightly  . Hypothyroidism    takes SYnthroid daily  . Joint pain   . Joint swelling   . Overactive bladder   . PONV (postoperative nausea and vomiting)    . Urinary urgency   . Weakness    and tingling in right foot    Past Surgical History:  Procedure Laterality Date  . ABDOMINAL HYSTERECTOMY    . ANTERIOR LAT LUMBAR FUSION N/A 04/16/2019   Procedure: LUMBAR TWO-THREE ANTERIOR LATERAL INTERBDOY FUSION;  Surgeon: Donalee Citrin, MD;  Location: [facility] OR;  Service: Neurosurgery;  Laterality: N/A;  . BACK SURGERY  2013   fusion  . BREAST SURGERY  1980   augmentation  . CARPAL TUNNEL RELEASE Left    2010 and then right in 2012  . COLONOSCOPY    . ESOPHAGOGASTRODUODENOSCOPY    . EYE SURGERY    . HAND SURGERY     CMP joint  . HARDWARE REMOVAL N/A 04/16/2019   Procedure: EXTENSION OF FUSION TO LUMBAR TWO -THREE  WITH REMOVAL OF HARDWARE AT LUMBAR FIVE-SACRAL ONE;  Surgeon: Donalee Citrin, MD;  Location: [facility] OR;  Service: Neurosurgery;  Laterality: N/A;  . JOINT REPLACEMENT     both hands  . TONSILLECTOMY  1980  . TOTAL THYROIDECTOMY  1981    Soc Hx - married 28 years, divorced. Two sons, one daughter, 7 grandchildren. Lives alone with her dog. Family is close and attentive.    reports that she has quit smoking. Her smoking use included cigarettes. She has a 10.00 pack-year smoking history. She has never used smokeless tobacco. She reports  that she does not drink alcohol and does not use drugs.  Allergies  Allergen Reactions  . Keflex [Cephalexin] Swelling and Rash    Facial swelling and rash  . Metaxalone Hives and Rash  . Morphine And Related Nausea And Vomiting    History reviewed. No pertinent family history.   Prior to Admission medications   Medication Sig Start Date End Date Taking? Authorizing Provider  fludrocortisone (FLORINEF) 0.1 MG tablet Take 0.05 mg by mouth daily. 05/09/20  Yes [provider]  promethazine-dextromethorphan (PROMETHAZINE-DM) 6.25-15 MG/5ML syrup Take 5 mLs by mouth 4 (four) times daily as needed for cough. 07/22/20 07/29/20 Yes [provider]  timolol (TIMOPTIC) 0.25 % ophthalmic solution  Place 1 drop into both eyes in the morning and at bedtime. 03/12/19  Yes [provider]  acetaminophen (TYLENOL) 500 MG tablet Take 1,000 mg by mouth 2 (two) times daily.    [provider]  acyclovir (ZOVIRAX) 400 MG tablet Take 400 mg by mouth 2 (two) times daily as needed (cold sore).     [provider]  APPLE CIDER VINEGAR PO Take 2 tablets by mouth daily.    [provider]  aspirin 81 MG chewable tablet Chew 81 mg by mouth daily. 07/02/20   [provider]  atorvastatin (LIPITOR) 40 MG tablet Take 40 mg by mouth daily. 05/09/20   [provider]  Biotin 10 MG TABS Take 10 mg by mouth daily.    [provider]  clopidogrel (PLAVIX) 75 MG tablet Take 75 mg by mouth daily. 06/20/20   [provider]  cyclobenzaprine (FLEXERIL) 10 MG tablet Take 1 tablet (10 mg total) by mouth 3 (three) times daily as needed for muscle spasms. 04/17/19   Costella, Darci CurrentVincent J, PA-C  doxycycline (VIBRAMYCIN) 100 MG capsule Take 100 mg by mouth See admin instructions. Bid x 7 days 07/22/20   [provider]  estradiol (ESTRACE) 0.5 MG tablet Take 0.5 mg by mouth daily.    [provider]  levothyroxine (SYNTHROID) 100 MCG tablet Take 100 mcg by mouth See admin instructions. Take 100 mcg by mouth before breakfast Monday - Friday    [provider]  levothyroxine (SYNTHROID) 75 MCG tablet Take 75 mcg by mouth See admin instructions. Take 75 mcg by mouth before breakfast on Saturday and Sunday    [provider]  omeprazole (PRILOSEC) 20 MG capsule Take 20 mg by mouth daily.    [provider]  oxybutynin (DITROPAN-XL) 10 MG 24 hr tablet Take 20 mg by mouth 2 (two) times daily. Taking differently: 20mg  BID x1 month per MD    [provider]  oxyCODONE-acetaminophen (PERCOCET) 7.5-325 MG tablet Take 1 tablet by mouth every 4 (four) hours as needed for severe pain. 04/17/19   Costella, Darci CurrentVincent J, PA-C   PARoxetine (PAXIL) 20 MG tablet Take 20 mg by mouth every morning.    [provider]  pravastatin (PRAVACHOL) 20 MG tablet Take 20 mg by mouth at bedtime.     [provider]    Physical Exam: Vitals:   07/26/20 0030 07/26/20 0045 07/26/20 0100 07/26/20 0115  BP: (!) 118/56 (!) 115/53 (!) 112/52 (!) 102/53  Pulse: 76 75 75 75  Resp: 20 19 19 19   Temp:      TempSrc:      SpO2: 94% 94% 94% 94%  Weight:         Vitals:   07/26/20 0030 07/26/20 0045 07/26/20 0100 07/26/20 0115  BP: (!) 118/56 (!) 115/53 (!) 112/52 (!) 102/53  Pulse: 76 75 75 75  Resp: 20 19 19 19   Temp:      TempSrc:      SpO2: 94% 94% 94% 94%  Weight:       General: heavy set woman in no distress Eyes: PERRL, lids and conjunctivae normal ENMT: Mucous membranes are moist. Posterior pharynx clear of any exudate or lesions.Normal dentition.  Neck: normal, supple, no masses, no thyromegaly Respiratory: clear to auscultation bilaterally, no wheezing, no crackles. Normal respiratory effort. No accessory muscle use.  Cardiovascular: Regular rate and rhythm, no murmurs / rubs / gallops. No extremity edema. 2+ pedal pulses. No carotid bruits.  Abdomen: overweight, no tenderness, no masses palpated. No hepatosplenomegaly. Bowel sounds positive.  Musculoskeletal: no clubbing / cyanosis. No joint deformity upper and lower extremities. Good ROM, no contractures. Normal muscle tone.  Skin: no rashes, lesions, ulcers. No induration Neurologic: CN 2-12 nl facial symmetry and movement, EOMI, PERRLA, clear speech, tongue nl. . Sensation intact, Strength 5/5 in all 4.  Psychiatric: Normal judgment and insight. Poor memory for days events, difficulty giving history in regard to surgeries or other evaluations.. Normal mood.   (A  Labs on Admission: I have personally reviewed following labs and imaging studies  CBC: Recent Labs  Lab 07/25/20 2037 07/25/20 2038  WBC 5.7  --   NEUTROABS 3.2  --   HGB 13.1  13.6  HCT 43.3 40.0  MCV 87.1  --   PLT 200  --    Basic Metabolic Panel: Recent Labs  Lab 07/25/20 2037 07/25/20 2038  NA 137 140  K 3.5 3.5  CL 104 105  CO2 23  --   GLUCOSE 107* 102*  BUN 10 11  CREATININE 0.88 0.80  CALCIUM 8.5*  --    GFR: CrCl cannot be calculated (Unknown ideal weight.). Liver Function Tests: Recent Labs  Lab 07/25/20 2037  AST 42*  ALT 16  ALKPHOS 78  BILITOT 1.8*  PROT 6.8  ALBUMIN 3.6   No results for input(s): LIPASE, AMYLASE in the last 168 hours. No results for input(s): AMMONIA in the last 168 hours. Coagulation Profile: Recent Labs  Lab 07/25/20 2037  INR 1.1   Cardiac Enzymes: No results for input(s): CKTOTAL, CKMB, CKMBINDEX, TROPONINI in the last 168 hours. BNP (last 3 results) No results for input(s): PROBNP in the last 8760 hours. HbA1C: No results for input(s): HGBA1C in the last 72 hours. CBG: Recent Labs  Lab 07/25/20 2032  GLUCAP 102*   Lipid Profile: Recent Labs    07/25/20 2335  TRIG 85   Thyroid Function Tests: No results for input(s): TSH, T4TOTAL, FREET4, T3FREE, THYROIDAB in the last 72 hours. Anemia Panel: Recent Labs    07/25/20 2335  FERRITIN 26   Urine analysis:    Component Value Date/Time   COLORURINE AMBER (A) 07/25/2020 2329   APPEARANCEUR CLEAR 07/25/2020 2329   LABSPEC 1.017 07/25/2020 2329   PHURINE 5.0 07/25/2020 2329   GLUCOSEU 50 (A) 07/25/2020 2329   HGBUR NEGATIVE 07/25/2020 2329   BILIRUBINUR NEGATIVE 07/25/2020 2329   KETONESUR NEGATIVE 07/25/2020 2329   PROTEINUR NEGATIVE 07/25/2020 2329   NITRITE NEGATIVE 07/25/2020 2329   LEUKOCYTESUR NEGATIVE 07/25/2020 2329    Radiological Exams on Admission: DG Chest Port 1 View  Result Date: 07/25/2020 CLINICAL DATA:  Cough and congestion EXAM: PORTABLE CHEST 1 VIEW COMPARISON:  None. FINDINGS: Cardiac shadow is at the upper limits of normal in  size accentuated by the frontal technique. Bilateral breast implants are noted. The  lungs are well aerated bilaterally with mild interstitial changes likely of a chronic nature. No focal confluent infiltrate is seen. Old rib fractures are noted on the right. No other bony abnormality is seen. IMPRESSION: Chronic interstitial changes without acute abnormality. Electronically Signed   By: Alcide CleverMark  Lukens M.D.   On: 07/25/2020 22:47   DG Hip Unilat W or Wo Pelvis 2-3 Views Left  Result Date: 07/25/2020 CLINICAL DATA:  Recent fall with left hip pain EXAM: DG HIP (WITH OR WITHOUT PELVIS) 3V LEFT COMPARISON:  None. FINDINGS: Pelvic ring is intact. Postsurgical changes are noted in the lower lumbar spine. Mild degenerative changes of left hip joint are noted. No acute fracture or dislocation is seen. No soft tissue abnormality is noted. IMPRESSION: No acute abnormality noted. Electronically Signed   By: Alcide CleverMark  Lukens M.D.   On: 07/25/2020 22:46   DG Hip Unilat W or Wo Pelvis 2-3 Views Right  Result Date: 07/25/2020 CLINICAL DATA:  Recent fall with right hip pain, initial encounter EXAM: DG HIP (WITH OR WITHOUT PELVIS) 3V RIGHT COMPARISON:  None. FINDINGS: Pelvic ring is intact. No acute fracture or dislocation is noted. No soft tissue abnormality is seen. Postsurgical changes in the lumbar spine are noted. IMPRESSION: No acute abnormality noted. Electronically Signed   By: Alcide CleverMark  Lukens M.D.   On: 07/25/2020 22:48   CT HEAD CODE STROKE WO CONTRAST  Result Date: 07/25/2020 CLINICAL DATA:  Code stroke. Initial evaluation for acute left-sided weakness, slurred speech. EXAM: CT HEAD WITHOUT CONTRAST TECHNIQUE: Contiguous axial images were obtained from the base of the skull through the vertex without intravenous contrast. COMPARISON:  Prior CT from 09/17/2019. FINDINGS: Brain: Moderately advanced age-related cerebral atrophy with chronic small vessel ischemic disease. No acute intracranial hemorrhage. No acute large vessel territory infarct. No mass lesion, midline shift or mass effect. No  hydrocephalus or extra-axial fluid collection. Vascular: No hyperdense vessel. Scattered vascular calcifications noted within the carotid siphons. Skull: Scalp soft tissues and calvarium within normal limits. Sinuses/Orbits: Globes and orbital soft tissues demonstrate no acute finding. Chronic right maxillary sinusitis noted. Additional scattered mild mucosal thickening noted elsewhere within the paranasal sinuses. No mastoid effusion. Other: None. ASPECTS [facility]([place] Stroke Program Early CT Score) - Ganglionic level infarction (caudate, lentiform nuclei, internal capsule, insula, M1-M3 cortex): 7 - Supraganglionic infarction (M4-M6 cortex): 3 Total score (0-10 with 10 being normal): 10 IMPRESSION: 1. No acute intracranial infarct or other abnormality. 2. ASPECTS is 10. 3. Moderately advanced cerebral atrophy with chronic small vessel ischemic disease. These results were communicated to Dr. Thomasena Edisollins at 8:49 pmon 1/21/2022by text page via the [facility] system. Electronically Signed   By: Rise MuBenjamin  McClintock M.D.   On: 07/25/2020 20:51    EKG: Independently reviewed. NSR w/o acute changes  Assessment/Plan Active Problems:   TIA (transient ischemic attack)   Radiculopathy of lumbar region    1. Neuro - patient has had two previous evaluations in the last 12 months for transient [facility]ic [facility] most recently Dec 2021 for blurry vision. During that admission Head CT negative, MRI brain negative for stroke, MRA head/neck - w/o acute occlusion, Echo unremarkable. No etiology for her symptoms identified. [facility] in [facility] to PCP. Plan Medical tele observation  MRI brain - it is possible there could be a change.   No repeat of MRA head/neck  PT/OT clearance before discharge  F/u with her [facility] neurology team  Will continue home meds  DVT prophylaxis: lovenox  Code Status: full code  Family Communication: spoke at length with  daughter Kipp Laurence -who informed me of previous workups.  Disposition Plan: home when stable. Needs to consider congregate lving Consults called: Neuor - Dr. Thomasena Edis (with names) Admission status: obs medical tele   Illene Regulus MD [facility] Pager [phone]  If 7PM-7AM, please contact night-coverage [email or web address] Password [email or web address]  07/26/2020, 1:58 AM

## 2020-07-26 NOTE — Progress Notes (Signed)
07/26/20 1412  PT Eval Information  Last PT Received On 07/26/20  Assistance Needed +1  PT/OT/SLP Co-Evaluation/Treatment Yes  Reason for Co-Treatment For patient/therapist safety;To address functional/ADL transfers  PT goals addressed during session Balance;Mobility/safety with mobility  History of Present Illness This 75 y.o. brought to ED with slurred speech and Rt LE weakness.  She was found to be COVID - 19 +.  MRI of brain showed no acute intracranial abnormality, moderate chronic microvascular ischemic disease. PMH includes: OA, scoliosis, chronic back pain, GERD, s/p lumbar fusion  Precautions  Precautions Fall  Restrictions  Weight Bearing Restrictions No  Home Living  Family/patient expects to be discharged to: Private residence  Living Arrangements Alone  Available Help at Discharge Family;Available PRN/intermittently  Type of Home Other(Comment) (townhouse)  Home Access Level entry  Home Layout One level  Bathroom Shower/Tub Walk-in Chartered loss adjuster seat;Grab bars - tub/shower;Walker - 2 wheels;Cane - single point  Additional Comments Pt reports her family is in and out during the day and able to assist her, however, RN reports family is limited on the amount of assist they can provide  Prior Function  Level of Independence Independent  Comments Pt indicates she is independent with ADLs and ambulation without an AD in the home, but uses a SPC outside.  She reports that her MD had her stop driving ~1 year ago due to cognitive deficits and that her sister manages her finances.  Pt reports she manages her own medications using pill boxes.  Communication  Communication No difficulties  Pain Assessment  Pain Assessment No/denies pain  Cognition  Arousal/Alertness Awake/alert  Behavior During Therapy WFL for tasks assessed/performed  Overall Cognitive Status No family/caregiver present to determine baseline cognitive functioning  Area  of Impairment Attention;Memory;Problem solving  Current Attention Level Selective  Memory Decreased short-term memory  Problem Solving Slow processing;Requires verbal cues  General Comments Pt slow to process orientation questions.  She is unable to tell me what medications she takes at home, but if I read her list off to her, she is able to verbalize the correct dosages and when she takes her meds.  Mildly decreased safety awareness  Upper Extremity Assessment  Upper Extremity Assessment Defer to OT evaluation  Lower Extremity Assessment  Lower Extremity Assessment Generalized weakness  Cervical / Trunk Assessment  Cervical / Trunk Assessment Normal  Bed Mobility  Overal bed mobility Needs Assistance  Bed Mobility Supine to Sit;Sit to Supine  Supine to sit Min guard  Sit to supine Min guard  General bed mobility comments min guard for safety  Transfers  Overall transfer level Needs assistance  Equipment used 1 person hand held assist  Transfers Sit to/from [employer] Transfers  Sit to Stand Min guard  Stand pivot transfers Min guard  General transfer comment min guard assist for safety. Reaching out to bed for support  Balance  Overall balance assessment Needs assistance  Sitting-balance support Feet supported  Sitting balance-Leahy Scale Good  Standing balance support Single extremity supported  Standing balance-Leahy Scale Poor  Standing balance comment requires UE support  General Comments  General comments (skin integrity, edema, etc.) VSS on RA  PT - End of Session  Activity Tolerance Patient tolerated treatment well  Patient left in bed;with call bell/phone within reach (on stretcher in ED)  Nurse Communication Mobility status  PT Assessment  PT Recommendation/Assessment Patient needs continued PT services  PT Visit Diagnosis Unsteadiness on feet (R26.81);Muscle weakness (  generalized) (M62.81)  PT Problem List Decreased strength;Decreased balance;Decreased  mobility;Decreased activity tolerance;Decreased knowledge of use of DME;Decreased knowledge of precautions  PT Plan  PT Frequency (ACUTE ONLY) Min 3X/week  PT Treatment/Interventions (ACUTE ONLY) Gait training;DME instruction;Therapeutic activities;Functional mobility training;Balance training;Therapeutic exercise;Neuromuscular re-education;Patient/family education  AM-PAC PT "6 Clicks" Mobility Outcome Measure (Version 2)  Help needed turning from your back to your side while in a flat bed without using bedrails? 3  Help needed moving from lying on your back to sitting on the side of a flat bed without using bedrails? 3  Help needed moving to and from a bed to a chair (including a wheelchair)? 3  Help needed standing up from a chair using your arms (e.g., wheelchair or bedside chair)? 3  Help needed to walk in hospital room? 3  Help needed climbing 3-5 steps with a railing?  3  6 Click Score 18  Consider Recommendation of Discharge To: Home with [facility]  PT Recommendation  Follow Up Recommendations Supervision/Assistance - 24 hour;Home health PT (vs ALF)  PT equipment None recommended by PT  Individuals Consulted  Consulted and Agree with Results and Recommendations Patient  Acute Rehab PT Goals  Patient Stated Goal did not state  PT Goal Formulation With patient  Time For Goal Achievement 08/09/20  Potential to Achieve Goals Good  PT Time Calculation  PT Start Time (ACUTE ONLY) 1242  PT Stop Time (ACUTE ONLY) 1308  PT Time Calculation (min) (ACUTE ONLY) 26 min  PT General Charges  $$ ACUTE PT VISIT 1 Visit  PT Evaluation  $PT Eval Low Complexity 1 Low    Pt admitted secondary to problem above with deficits below. Pt requiring min guard A for mobility tasks within the room. Mild unsteadiness noted, and educated about using RW. Pt also presenting with cognitive deficits as well throughout and reports she hasn't been driving a year because of it. Feel pt will require 24/7 supervision at  d/c given cognitive deficits until at baseline. May benefit from ALF as well. Will continue to follow acutely.    Farley Ly, PT, DPT  Acute Rehabilitation Services  Pager: [phone] Office: [phone]

## 2020-07-26 NOTE — Code Documentation (Signed)
Responded to Code Stroke called at 2009 for L sided weakness, slurred speech, and AMS, [id number]. Pt arrived at 2027, CBG-102, NIH-3(though this is waxing and waning), CT head-negative for acute changes. Plan to give ASA and admit for stroke workup.

## 2020-07-26 NOTE — Progress Notes (Signed)
Pt has been admitted to the unit with all belongings. All equipment has been sent with the patient as well. PT denies any pain and has call light and telephone in hand,  07/26/20 1826  Vitals  Temp 98.6 F (37 C)  Temp Source Oral  BP (!) 158/58  MAP (mmHg) 81  BP Location Right Arm  BP Method Automatic  Patient Position (if appropriate) Sitting  Pulse Rate 73  Pulse Rate Source Dinamap  Resp 20  Level of Consciousness  Level of Consciousness Alert  MEWS COLOR  MEWS Score Color Green  Oxygen Therapy  SpO2 96 %  O2 Device Room Air  Pain Assessment  Pain Scale 0-10  MEWS Score  MEWS Temp 0  MEWS Systolic 0  MEWS Pulse 0  MEWS RR 0  MEWS LOC 0  MEWS Score 0

## 2020-07-26 NOTE — ED Notes (Signed)
Patient not yet in assigned room, patient currently in MRI. NT to bring over with complete.

## 2020-07-26 NOTE — Progress Notes (Signed)
*  PRELIMINARY RESULTS* Echocardiogram 2D Echocardiogram has been performed with Definity.  Stacey Drain 07/26/2020, 5:44 PM

## 2020-07-26 NOTE — ED Notes (Signed)
Pt gave permission for this RN to call daughter Joice Lofts to update.  Spoke with [facility] who denied new questions.

## 2020-07-26 NOTE — Progress Notes (Addendum)
75 year old female with history of lumbar disc disease status post surgery, GERD, hyperlipidemia, hypothyroidism admitted for slurred speech.  Concerns of TIA, CT head negative.  Seen by neurology.  Had MRI done which was negative for acute changes but showed chronic age-related changes.  Neurology team was consulted.  She was also found to be COVID-19 positive with negative chest x-ray and respiratory signs and symptoms. Seen at bedside, feels better. Speech  Has improved.   Vitals are stable no complaints.   Procal- neg, LDL 40, A1C 5.4, UA - neg, CXR- Neg MRI - negative   She was admitted with TIA symptoms about a month ago at an outpatient hospital with dc instrcution to be on ASA/plavix for 21 days followed by ASA monotherapy and Statin.   Will continue to monitor her. Waiting on Echo, CTA head and neck, PT/OT.   Spoke with her daughter this morning.   Stephania Fragmin MD [facility]

## 2020-07-26 NOTE — ED Notes (Signed)
Patient transported to CT in stable condition via stretcher 

## 2020-07-26 NOTE — ED Notes (Signed)
Heated patients tray. Sitting up in bed eating. Phone is charging. This nurse gave update to patients son.

## 2020-07-26 NOTE — Progress Notes (Signed)
Occupational Therapy Evaluation Patient Details Name: Shannon Gutierrez MRN: [id number] DOB: 08/03/1945 Today's Date: 07/26/2020    History of Present Illness This 75 y.o. brought to ED with slurred speech and Rt LE weakness.  She was found to be COVID - 19 +.  MRI of brain showed no acute intracranial abnormality, moderate chronic microvascular ischemic disease. PMH includes: OA, scoliosis, chronic back pain, GERD, s/p lumbar fusion   Clinical Impression   Pt admitted with above. She demonstrates the below listed deficits and will benefit from continued OT to maximize safety and independence with BADLs.  Pt presents to OT with generalized weakness, mild imbalance, and impaired cognition.  She is able to perform ADLs with set up to min guard assist.  She reports she lives alone with family checking in on her.  Recommend she have 24 hour assist initially until she is back to baseline, then intermittent assist.  Recommend assist with medication management to ensure she is managing that well.  She probably would benefit from ALF.         Follow Up Recommendations  HHOT; Supervision/Assistance - 24 hour until back to baseline.  May benefit from ALF.  Recommend assist for medication management.    Equipment Recommendations  None recommended by OT    Recommendations for Other Services       Precautions / Restrictions Precautions Precautions: Fall Restrictions Weight Bearing Restrictions: No      Mobility Bed Mobility Overal bed mobility: Needs Assistance Bed Mobility: Supine to Sit;Sit to Supine     Supine to sit: Min guard Sit to supine: Min guard   General bed mobility comments: to and from high ED stretcher.  min guard for safety    Transfers Overall transfer level: Needs assistance Equipment used: 1 person hand held assist Transfers: Sit to/from [employer] Sit to Stand: Min guard Stand pivot transfers: Min guard       General transfer comment: min  guard assist for safety    Balance Overall balance assessment: Needs assistance Sitting-balance support: Feet supported Sitting balance-Leahy Scale: Good     Standing balance support: Single extremity supported Standing balance-Leahy Scale: Poor Standing balance comment: requires UE support                           ADL either performed or assessed with clinical judgement   ADL Overall ADL's : Needs assistance/impaired Eating/Feeding: Independent;Sitting;Bed level   Grooming: Wash/dry hands;Wash/dry face;Oral care;Brushing hair;Set up;Sitting;Bed level   Upper Body Bathing: Supervision/ safety;Sitting   Lower Body Bathing: Min guard;Sit to/from stand   Upper Body Dressing : Set up;Supervision/safety;Sitting   Lower Body Dressing: Min guard;Sit to/from stand   Toilet Transfer: Min guard;Stand-pivot;BSC   Toileting- Architect and Hygiene: Min guard;Sit to/from stand       Functional mobility during ADLs: Min guard General ADL Comments: pt requires supervision for safety and min guard assist for balance     Vision Baseline Vision/History: Wears glasses Wears Glasses: At all times Vision Assessment?: No apparent visual deficits     Perception     Praxis      Pertinent Vitals/Pain Pain Assessment: No/denies pain     Hand Dominance     Extremity/Trunk Assessment Upper Extremity Assessment Upper Extremity Assessment: Generalized weakness   Lower Extremity Assessment Lower Extremity Assessment: Generalized weakness   Cervical / Trunk Assessment Cervical / Trunk Assessment: Normal   Communication Communication Communication: No difficulties   Cognition  Arousal/Alertness: Awake/alert Behavior During Therapy: WFL for tasks assessed/performed Overall Cognitive Status: No family/caregiver present to determine baseline cognitive functioning Area of Impairment: Attention;Memory;Problem solving                   Current Attention  Level: Selective Memory: Decreased short-term memory       Problem Solving: Slow processing;Requires verbal cues General Comments: Pt slow to process orientation questions.  She is unable to tell me what medications she takes at home, but if I read her list off to her, she is able to verbalize the correct dosages and when she takes her meds.  Mildly decreased safety awareness   General Comments  VSS on RA    Exercises     Shoulder Instructions      Home Living Family/patient expects to be discharged to:: Private residence Living Arrangements: Alone Available Help at Discharge: Family;Available PRN/intermittently Type of Home: Other(Comment) (townhouse) Home Access: Level entry     Home Layout: One level     Bathroom Shower/Tub: Producer, television/film/video: Standard     Home Equipment: Shower seat;Grab bars - tub/shower   Additional Comments: Pt reports her family is in and out during the day and able to assist her, however, RN reports family is limited on the amount of assist they can provide      Prior Functioning/Environment Level of Independence: Independent        Comments: Pt indicates she is independent with ADLs and ambulation without an AD in the home, but uses a SPC outside.  She reports that her MD had her stop driving ~1 year ago due to cognitive deficits and that her sister manages her finances.  Pt reports she manages her own medications using pill boxes.        OT Problem List: Impaired balance (sitting and/or standing);Decreased cognition;Decreased knowledge of use of DME or AE;Decreased safety awareness      OT Treatment/Interventions: Self-care/ADL training;DME and/or AE instruction;Therapeutic activities;Cognitive remediation/compensation;Patient/family education;Balance training    OT Goals(Current goals can be found in the care plan section) Acute Rehab OT Goals Patient Stated Goal: did not state OT Goal Formulation: With patient Time For  Goal Achievement: 08/07/20 Potential to Achieve Goals: Good ADL Goals Pt Will Perform Grooming: with modified independence;standing Pt Will Perform Upper Body Bathing: with modified independence;sitting Pt Will Perform Lower Body Bathing: with modified independence;sit to/from stand Pt Will Perform Upper Body Dressing: with modified independence;sitting Pt Will Perform Lower Body Dressing: with modified independence;sit to/from stand Pt Will Transfer to Toilet: with modified independence;ambulating;regular height toilet;grab bars Pt Will Perform Toileting - Clothing Manipulation and hygiene: with modified independence;sit to/from stand  OT Frequency: Min 2X/week   Barriers to D/C: Decreased caregiver support          Co-evaluation PT/OT/SLP Co-Evaluation/Treatment: Yes Reason for Co-Treatment: For patient/therapist safety;To address functional/ADL transfers PT goals addressed during session: Balance;Mobility/safety with mobility OT goals addressed during session: ADL's and self-care      AM-PAC OT "6 Clicks" Daily Activity     Outcome Measure Help from another person eating meals?: None Help from another person taking care of personal grooming?: A Little Help from another person toileting, which includes using toliet, bedpan, or urinal?: A Little Help from another person bathing (including washing, rinsing, drying)?: A Little Help from another person to put on and taking off regular upper body clothing?: A Little Help from another person to put on and taking off regular lower body clothing?:  A Little 6 Click Score: 19   End of Session Nurse Communication: Mobility status  Activity Tolerance: Patient tolerated treatment well Patient left: in bed;with call bell/phone within reach  OT Visit Diagnosis: Unsteadiness on feet (R26.81);Cognitive communication deficit (S28.768)                Time: 1157-2620 OT Time Calculation (min): 26 min Charges:  OT General Charges $OT Visit:  1 Visit  Eber Jones., OTR/L Acute Rehabilitation Services Pager [phone] Office [phone]   Jeani Hawking M 07/26/2020, 2:13 PM

## 2020-07-26 NOTE — ED Notes (Signed)
Pt up to bedside commode. Remains confused.

## 2020-07-26 NOTE — ED Notes (Signed)
Both IV fluid and Remdesivir moved to right FA IV d/t constant "occlusion" beeping on IV channel.

## 2020-07-26 NOTE — ED Notes (Signed)
Assumed care of patient at 0300. Previously ordered meds have not been given. This nurse gave medications late.

## 2020-07-26 NOTE — ED Notes (Signed)
Shannon Gutierrez [phone] please call with any changes or plans for D/C

## 2020-07-26 NOTE — Progress Notes (Signed)
STROKE TEAM PROGRESS NOTE   HISTORY OF PRESENT ILLNESS (per record) Shannon Gutierrez is a 75 y.o. female right handed PMHx as reviewed below, mild dementia with left sided weakness and slurred speech. Patient walks with a walker at baseline and was on the phone with her son, and he thought her words sounded slurred. He called EMS. Per EMS the patient had left sided weakness speech was slurred. As information came in, apparently the son had gone to see her and she apparently had trouble walking to the door. It was not clear if the patient ambulated with or without her walker. LKW: 1740 tpa given?: No, mild IR Thrombectomy? No, Modified Rankin Scale: 1-No significant post stroke disability and can perform usual duties with stroke symptoms NIHSS: 2   INTERVAL HISTORY I have personally reviewed history of presenting illness, electronic medical records and imaging films in PACS.  Patient apparently presented with some left-sided weakness and confusion.  MRI scan is negative for acute infarct.  CT angiogram of brain and neck did not show significant large vessel stenosis or occlusion.  It is unclear if patient has some mild cognitive impairment at baseline or not.    OBJECTIVE Vitals:   07/26/20 0345 07/26/20 0500 07/26/20 0536 07/26/20 0800  BP: 135/67 (!) 116/56  (!) 140/129  Pulse: 75 69 70 71  Resp: 20 18 16 15   Temp:   97.8 F (36.6 C)   TempSrc:   Oral   SpO2: 94% 92% 95% 98%  Weight:        CBC:  Recent Labs  Lab 07/25/20 2037 07/25/20 2038  WBC 5.7  --   NEUTROABS 3.2  --   HGB 13.1 13.6  HCT 43.3 40.0  MCV 87.1  --   PLT 200  --     Basic Metabolic Panel:  Recent Labs  Lab 07/25/20 2037 07/25/20 2038  NA 137 140  K 3.5 3.5  CL 104 105  CO2 23  --   GLUCOSE 107* 102*  BUN 10 11  CREATININE 0.88 0.80  CALCIUM 8.5*  --     Lipid Panel:     Component Value Date/Time   CHOL 78 07/26/2020 0500   TRIG 68 07/26/2020 0500   HDL 24 (L) 07/26/2020 0500    CHOLHDL 3.3 07/26/2020 0500   VLDL 14 07/26/2020 0500   LDLCALC 40 07/26/2020 0500   HgbA1c:  Lab Results  Component Value Date   HGBA1C 5.4 07/26/2020   Urine Drug Screen:     Component Value Date/Time   LABOPIA NONE DETECTED 07/25/2020 2329   COCAINSCRNUR NONE DETECTED 07/25/2020 2329   LABBENZ NONE DETECTED 07/25/2020 2329   AMPHETMU NONE DETECTED 07/25/2020 2329   THCU NONE DETECTED 07/25/2020 2329   LABBARB NONE DETECTED 07/25/2020 2329    Alcohol Level     Component Value Date/Time   ETH <10 07/25/2020 2116    IMAGING  MR BRAIN WO CONTRAST 07/26/2020 IMPRESSION:  1. No acute intracranial abnormality.  2. Moderate chronic microvascular ischemic disease.  3. Chronic right maxillary sinusitis.   CT HEAD CODE STROKE WO CONTRAST 07/25/2020 IMPRESSION:  1. No acute intracranial infarct or other abnormality.  2. ASPECTS is 10.  3. Moderately advanced cerebral atrophy with chronic small vessel ischemic disease.   DG Chest Port 1 View 07/25/2020 IMPRESSION:  Chronic interstitial changes without acute abnormality.    DG Hip Unilat W or Wo Pelvis 2-3 Views Left 07/25/2020 IMPRESSION:  No acute abnormality noted.  DG  Hip Unilat W or Wo Pelvis 2-3 Views Right 07/25/2020 IMPRESSION:  No acute abnormality noted.   Transthoracic Echocardiogram  00/00/2021 Pending  ECG - SR rate 87 BPM. (See cardiology reading for complete details)  PHYSICAL EXAM Blood pressure (!) 140/129, pulse 71, temperature 97.8 F (36.6 C), temperature source Oral, resp. rate 15, weight 77 kg, SpO2 98 %. Pleasant elderly Caucasian lady not in distress. . Afebrile. Head is nontraumatic. Neck is supple without bruit.    Cardiac exam no murmur or gallop. Lungs are clear to auscultation. Distal pulses are well felt.  Neurological Exam ;  Awake  Alert oriented x2.  Mildly diminished attention, registration and recall.. Normal speech and language.eye movements full without nystagmus.fundi were not  visualized. Vision acuity and fields appear normal. Hearing is normal. Palatal movements are normal. Face symmetric. Tongue midline. Normal strength, tone, reflexes and coordination. Normal sensation. Gait deferred.     ASSESSMENT/PLAN Shannon Gutierrez is a 75 y.o. female with history of anxiety, mild dementia, chronic back pain, osteoarthritis, recent removal of L2-3 hardware 05/2020, hyperlipidemia and hypothyroidism presenting slurred speech, right leg weakness, change in sensation left leg.  She did not receive IV t-PA since deficits were mild.  Possible TIA:   Resultant no deficits  Code Stroke CT Head -  No acute intracranial infarct or other abnormality. ASPECTS is 10. Moderately advanced cerebral atrophy with chronic small vessel ischemic disease.  CT head - not ordered  MRI head - No acute intracranial abnormality. Moderate chronic microvascular ischemic disease.  MRA head - not ordered  CTA H&N -no large vessel stenosis or occlusion   CT Perfusion - not ordered  Carotid Doppler - CTA neck ordered - carotid dopplers not indicated.  2D Echo - pending  Loyal Jacobson Virus 2 - Positive   LDL - 40  HgbA1c - 5.4  UDS - negative VTE prophylaxis - none (consider SCDs or Lovenox 40 mg daily)  Diet  Diet Order            Diet regular Room service appropriate? Yes; Fluid consistency: Thin  Diet effective now                 aspirin 81 mg daily and clopidogrel 75 mg daily prior to admission, now on clopidogrel 75 mg daily (Pt had ASA 324 mg one dose on 07/25/20 -> will add ASA 81 mg daily to Plavix)  Patient will be counseled to be compliant with her antithrombotic medications  Ongoing aggressive stroke risk factor management  Therapy recommendations:  pending  Disposition:  Pending  Hypertension  Home BP meds: none   Current BP meds: none   Stable . Permissive hypertension (OK if < 220/120) but gradually normalize in 5-7 days  . Long-term BP goal  normotensive  Hyperlipidemia  Home Lipid lowering medication: Pravachol 40 mg daily ?   LDL 40, goal < 70  Current lipid lowering medication: Lipitor 40 mg daily   Continue statin at discharge  Other Stroke Risk Factors  Advanced age  Former cigarette smoker - quit  Obesity, Body mass index is 29.6 kg/m., recommend weight loss, diet and exercise as appropriate   Other Active Problems, Findings, Recommendations and/or Plan  Code status - Full code  Covid Positive - Remdesivir ;   Blood cultures - pending (febrile at time of admission)  Hospital day # 73  75 year old Caucasian lady with transient episode of confusion left-sided weakness which appears to have resolved.  Possibly right brain subcortical  TIA from small vessel disease.  This happened in the setting of an acute COVID infection.  Recommend continue aspirin and Plavix Not sure why she is on dual antiplatelet therapy.  Continue ongoing stroke work-up.  Son has expressed some concern about dementia which can be worked up later as an outpatient.  Check echocardiogram results.  Treatment of COVID infection as per primary team.  Discussed with Dr. Nelson Chimes. greater than 50% time during this 35-minute visit was spent on counseling and coordination of care about TIA and stroke and dementia and answering questions and discussion with care team Delia Heady, MD To contact Stroke Continuity provider, please refer to [email or web address]. After hours, contact General Neurology

## 2020-07-27 DIAGNOSIS — R4781 Slurred speech: Secondary | ICD-10-CM

## 2020-07-27 LAB — BASIC METABOLIC PANEL
Anion gap: 9 (ref 5–15)
BUN: 14 mg/dL (ref 8–23)
CO2: 25 mmol/L (ref 22–32)
Calcium: 8.3 mg/dL — ABNORMAL LOW (ref 8.9–10.3)
Chloride: 109 mmol/L (ref 98–111)
Creatinine, Ser: 1.01 mg/dL — ABNORMAL HIGH (ref 0.44–1.00)
GFR, Estimated: 58 mL/min — ABNORMAL LOW (ref >60)
Glucose, Bld: 97 mg/dL (ref 70–99)
Potassium: 3.3 mmol/L — ABNORMAL LOW (ref 3.5–5.1)
Sodium: 143 mmol/L (ref 135–145)

## 2020-07-27 LAB — URINE CULTURE: Culture: NO GROWTH

## 2020-07-27 LAB — CBC
HCT: 37.5 % (ref 36.0–46.0)
Hemoglobin: 11.6 g/dL — ABNORMAL LOW (ref 12.0–15.0)
MCH: 27 pg (ref 26.0–34.0)
MCHC: 30.9 g/dL (ref 30.0–36.0)
MCV: 87.4 fL (ref 80.0–100.0)
Platelets: 189 10*3/uL (ref 150–400)
RBC: 4.29 MIL/uL (ref 3.87–5.11)
RDW: 14 % (ref 11.5–15.5)
WBC: 6.5 10*3/uL (ref 4.0–10.5)
nRBC: 0 % (ref 0.0–0.2)

## 2020-07-27 LAB — ECHOCARDIOGRAM COMPLETE
Area-P 1/2: 2.66 cm2
P 1/2 time: 542 msec
S' Lateral: 2.5 cm
Weight: 2716.07 oz

## 2020-07-27 LAB — MAGNESIUM: Magnesium: 2.1 mg/dL (ref 1.7–2.4)

## 2020-07-27 LAB — D-DIMER, QUANTITATIVE: D-Dimer, Quant: 0.41 ug/mL-FEU (ref 0.00–0.50)

## 2020-07-27 LAB — FERRITIN: Ferritin: 31 ng/mL (ref 11–307)

## 2020-07-27 LAB — C-REACTIVE PROTEIN: CRP: 2.7 mg/dL — ABNORMAL HIGH (ref <1.0)

## 2020-07-27 MED ORDER — BENZONATATE 100 MG PO CAPS: 100.0000 mg | ORAL_CAPSULE | Freq: Three times a day (TID) | ORAL | 0 refills | Status: AC | PRN

## 2020-07-27 NOTE — TOC Transition Note (Signed)
Transition of Care [facility]) - CM/SW Discharge Note   Patient Details  Name: Shannon Gutierrez MRN: [id number] Date of Birth: Mar 04, 1946  Transition of Care [facility] CM/SW Contact:  Lawerance Sabal, RN Phone Number: 07/27/2020, 9:35 AM   Clinical Narrative:    [name] w patient to discuss DC plan. She states that she is currently getting outpatient PT and has several appointments still scheduled, including for this week. We discussed the differences between [name] and outpatient services. Patient would like to continue with services she already has scheduled. No other CM needs identified.     Final next level of care: Home/Self Care Barriers to Discharge: No Barriers Identified   Patient Goals and CMS Choice        Discharge Placement                       Discharge Plan and Services                                     Social Determinants of Health (SDOH) Interventions     Readmission Risk Interventions No flowsheet data found.

## 2020-07-27 NOTE — Progress Notes (Signed)
This pt has been discharged from the unit home by wheelchair. Pt has all IV and tele removed. All belongings were sent with the patient. AVS has been given and reviewed.

## 2020-07-27 NOTE — Progress Notes (Signed)
STROKE TEAM PROGRESS NOTE   HISTORY OF PRESENT ILLNESS (per record) Shannon Gutierrez is a 75 y.o. female right handed PMHx as reviewed below, mild dementia with left sided weakness and slurred speech. Patient walks with a walker at baseline and was on the phone with her son, and he thought her words sounded slurred. He called EMS. Per EMS the patient had left sided weakness speech was slurred. As information came in, apparently the son had gone to see her and she apparently had trouble walking to the door. It was not clear if the patient ambulated with or without her walker. LKW: 1740 tpa given?: No, mild IR Thrombectomy? No, Modified Rankin Scale: 1-No significant post stroke disability and can perform usual duties with stroke symptoms NIHSS: 2   INTERVAL HISTORY Patient is sitting up in bed.  She wants to go home.  She feels she is back to baseline.  She informs me that she has had memory difficulties for quite some time and has seen neurologist in [facility] and also more recently one in [facility].  By Dr. Vincente Poli on 06/20/2020 and had an MRI which showed chronic changes of small vessel disease and mild atrophy.  Review of her prior records in Care Everywhere show that she has seen neurologist Dr. Delphia Grates in [facility] who thought she had mild cognitive impairment with contribution from vascular sources.   OBJECTIVE Vitals:   07/26/20 1826 07/26/20 2052 07/26/20 2304 07/27/20 0445  BP: (!) 158/58 (!) 154/53 (!) 136/57 (!) 145/58  Pulse: 73 75 74 69  Resp: 20 20 16    Temp: 98.6 F (37 C) 98.7 F (37.1 C) 98.7 F (37.1 C) 98.4 F (36.9 C)  TempSrc: Oral Oral Oral Oral  SpO2: 96% 97% 93% 94%  Weight:        CBC:  Recent Labs  Lab 07/25/20 2037 07/25/20 2038 07/27/20 0139  WBC 5.7  --  6.5  NEUTROABS 3.2  --   --   HGB 13.1 13.6 11.6*  HCT 43.3 40.0 37.5  MCV 87.1  --  87.4  PLT 200  --  189    Basic Metabolic Panel:  Recent Labs  Lab 07/25/20 2037 07/25/20 2038  07/27/20 0139  NA 137 140 143  K 3.5 3.5 3.3*  CL 104 105 109  CO2 23  --  25  GLUCOSE 107* 102* 97  BUN 10 11 14   CREATININE 0.88 0.80 1.01*  CALCIUM 8.5*  --  8.3*  MG  --   --  2.1    Lipid Panel:     Component Value Date/Time   CHOL 78 07/26/2020 0500   TRIG 68 07/26/2020 0500   HDL 24 (L) 07/26/2020 0500   CHOLHDL 3.3 07/26/2020 0500   VLDL 14 07/26/2020 0500   LDLCALC 40 07/26/2020 0500   HgbA1c:  Lab Results  Component Value Date   HGBA1C 5.4 07/26/2020   Urine Drug Screen:     Component Value Date/Time   LABOPIA NONE DETECTED 07/25/2020 2329   COCAINSCRNUR NONE DETECTED 07/25/2020 2329   LABBENZ NONE DETECTED 07/25/2020 2329   AMPHETMU NONE DETECTED 07/25/2020 2329   THCU NONE DETECTED 07/25/2020 2329   LABBARB NONE DETECTED 07/25/2020 2329    Alcohol Level     Component Value Date/Time   ETH <10 07/25/2020 2116    IMAGING  MR BRAIN WO CONTRAST 07/26/2020 IMPRESSION:  1. No acute intracranial abnormality.  2. Moderate chronic microvascular ischemic disease.  3. Chronic right  maxillary sinusitis.   CT HEAD CODE STROKE WO CONTRAST 07/25/2020 IMPRESSION:  1. No acute intracranial infarct or other abnormality.  2. ASPECTS is 10.  3. Moderately advanced cerebral atrophy with chronic small vessel ischemic disease.   DG Chest Port 1 View 07/25/2020 IMPRESSION:  Chronic interstitial changes without acute abnormality.    DG Hip Unilat W or Wo Pelvis 2-3 Views Left 07/25/2020 IMPRESSION:  No acute abnormality noted.  DG Hip Unilat W or Wo Pelvis 2-3 Views Right 07/25/2020 IMPRESSION:  No acute abnormality noted.   Transthoracic Echocardiogram  00/00/2021 Pending  ECG - SR rate 87 BPM. (See cardiology reading for complete details)  PHYSICAL EXAM Blood pressure (!) 145/58, pulse 69, temperature 98.4 F (36.9 C), temperature source Oral, resp. rate 16, weight 77 kg, SpO2 94 %. Pleasant elderly Caucasian lady not in distress. . Afebrile. Head  is nontraumatic. Neck is supple without bruit.    Cardiac exam no murmur or gallop. Lungs are clear to auscultation. Distal pulses are well felt.  Neurological Exam ;  Awake  Alert oriented x2.  Mildly diminished attention, registration and recall.. Normal speech and language.eye movements full without nystagmus.fundi were not visualized. Vision acuity and fields appear normal. Hearing is normal. Palatal movements are normal. Face symmetric. Tongue midline. Normal strength, tone, reflexes and coordination. Normal sensation. Gait deferred.     ASSESSMENT/PLAN Ms. Shannon Gutierrez is a 75 y.o. female with history of anxiety, mild dementia, chronic back pain, osteoarthritis, recent removal of L2-3 hardware 05/2020, hyperlipidemia and hypothyroidism presenting slurred speech, right leg weakness, change in sensation left leg.  She did not receive IV t-PA since deficits were mild.  Possible TIA:   Suspect she has mild cognitive impairment at baseline and is followed by neurologist in [facility] and [facility]  Resultant no deficits  Code Stroke CT Head -  No acute intracranial infarct or other abnormality. ASPECTS is 10. Moderately advanced cerebral atrophy with chronic small vessel ischemic disease.  CT head - not ordered  MRI head - No acute intracranial abnormality. Moderate chronic microvascular ischemic disease.  MRA head - not ordered  CTA H&N -no large vessel stenosis or occlusion   CT Perfusion - not ordered  Carotid Doppler - CTA neck ordered - carotid dopplers not indicated.  2D Echo -ejection fraction 65 to 70%.  No cardiac source of embolism.    Sars Corona Virus 2 - Positive   LDL - 40  HgbA1c - 5.4  UDS - negative VTE prophylaxis - none (consider SCDs or Lovenox 40 mg daily)  Diet  Diet Order            Diet regular Room service appropriate? Yes; Fluid consistency: Thin  Diet effective now                 aspirin 81 mg daily and clopidogrel 75 mg daily  prior to admission, now on clopidogrel 75 mg daily (Pt had ASA 324 mg one dose on 07/25/20 -> will add ASA 81 mg daily to Plavix)  Patient will be counseled to be compliant with her antithrombotic medications  Ongoing aggressive stroke risk factor management  Therapy recommendations:  pending  Disposition:  Pending  Hypertension  Home BP meds: none   Current BP meds: none   Stable . Permissive hypertension (OK if < 220/120) but gradually normalize in 5-7 days  . Long-term BP goal normotensive  Hyperlipidemia  Home Lipid lowering medication: Pravachol 40 mg daily ?   LDL  40, goal < 70  Current lipid lowering medication: Lipitor 40 mg daily   Continue statin at discharge  Other Stroke Risk Factors  Advanced age  Former cigarette smoker - quit  Obesity, Body mass index is 29.6 kg/m., recommend weight loss, diet and exercise as appropriate   Other Active Problems, Findings, Recommendations and/or Plan  Code status - Full code  Covid Positive - Remdesivir ;   Blood cultures - pending (febrile at time of admission)  Hospital day # 41  75 year old Caucasian lady with transient episode of confusion left-sided weakness which appears to have resolved.  Possibly right brain subcortical TIA from small vessel disease.  This happened in the setting of an acute COVID infection.  Recommend continue aspirin and Plavix into 3 weeks followed by aspirin alone Recommend follow-up with her neurologist in [facility] after discharge for her cognitive concerns.  Treatment of COVID infection as per primary team.  Discussed with Dr. Nelson Chimes. greater than 50% time during this 25-minute visit was spent on counseling and coordination of care about TIA and stroke and dementia and answering questions and discussion with care team Delia Heady, MD To contact Stroke Continuity provider, please refer to [email or web address]. After hours, contact General Neurology

## 2020-07-27 NOTE — Discharge Summary (Signed)
Physician Discharge Summary  Shannon Gutierrez EGB:[id number] DOB: 10-20-1945 DOA: 07/25/2020  PCP: Cheron Schaumann., MD  Admit date: 07/25/2020 Discharge date: 07/27/2020  Admitted From: Home Disposition: Home  Recommendations for Outpatient Follow-up:  1. Follow up with PCP in 1-2 weeks 2. Please obtain BMP/CBC in one week your next doctors visit.  3. Continue daily aspirin.  She is not supposed to be on Plavix anymore. 4. Continue Lipitor. 5. Tessalon Perles for cough.    Discharge Condition: Stable CODE STATUS: Full code Diet recommendation: Heart healthy  Brief/Interim Summary: 75 year old female with history of lumbar disc disease status post surgery, GERD, hyperlipidemia, hypothyroidism admitted for slurred speech.  Concerns of TIA, CT head negative.  Seen by neurology.  Had MRI done which was negative for acute changes but showed chronic age-related changes.  Neurology team was consulted.  She was also found to be COVID-19 positive with negative chest x-ray and respiratory signs and symptoms.  She was seen by neurology.  Her inpatient work-up including CT head, MRI brain, CTA head and neck and echocardiogram were unremarkable for any acute findings.  LDL 40, A1c 5.4.  She was asymptomatic from COVID-19.  Reviewing the previous records from her last month admission she was supposed to be on aspirin Plavix for 21 days followed by aspirin alone.  There is no further indication for Plavix therefore it has been discontinued.  Patient will continue aspirin and statin.  Daughter updated at the time of the dc, answered all the questions.   Body mass index is 29.6 kg/m.      Discharge Diagnoses:  Active Problems:   Radiculopathy of lumbar region   TIA (transient ischemic attack)   Slurred speech   Subjective: Doing ok, no acute events overnight.   Discharge Exam: Vitals:   07/26/20 2304 07/27/20 0445  BP: (!) 136/57 (!) 145/58  Pulse: 74 69  Resp: 16   Temp: 98.7 F  (37.1 C) 98.4 F (36.9 C)  SpO2: 93% 94%   Vitals:   07/26/20 1826 07/26/20 2052 07/26/20 2304 07/27/20 0445  BP: (!) 158/58 (!) 154/53 (!) 136/57 (!) 145/58  Pulse: 73 75 74 69  Resp: 20 20 16    Temp: 98.6 F (37 C) 98.7 F (37.1 C) 98.7 F (37.1 C) 98.4 F (36.9 C)  TempSrc: Oral Oral Oral Oral  SpO2: 96% 97% 93% 94%  Weight:        General: Pt is alert, awake, not in acute distress Cardiovascular: RRR, S1/S2 +, no rubs, no gallops Respiratory: CTA bilaterally, no wheezing, no rhonchi Abdominal: Soft, NT, ND, bowel sounds + Extremities: no edema, no cyanosis  Discharge Instructions   Allergies as of 07/27/2020      Reactions   Keflex [cephalexin] Swelling, Rash   Facial swelling and rash   Metaxalone Hives, Rash   Morphine And Related Nausea And Vomiting      Medication List    STOP taking these medications   clopidogrel 75 MG tablet Commonly known as: PLAVIX     TAKE these medications   acetaminophen 500 MG tablet Commonly known as: TYLENOL Take 1,000 mg by mouth 2 (two) times daily as needed for mild pain or headache.   acyclovir 400 MG tablet Commonly known as: ZOVIRAX Take 400 mg by mouth 2 (two) times daily as needed (cold sore).   aspirin 81 MG chewable tablet Chew 81 mg by mouth daily.   atorvastatin 40 MG tablet Commonly known as: LIPITOR Take 40 mg by mouth  daily.   Biotin 10 MG Tabs Take 10 mg by mouth daily.   cyclobenzaprine 10 MG tablet Commonly known as: FLEXERIL Take 1 tablet (10 mg total) by mouth 3 (three) times daily as needed for muscle spasms.   doxycycline 100 MG capsule Commonly known as: VIBRAMYCIN Take 100 mg by mouth See admin instructions. Bid x 7 days   estradiol 0.5 MG tablet Commonly known as: ESTRACE Take 0.5 mg by mouth daily.   fludrocortisone 0.1 MG tablet Commonly known as: FLORINEF Take 0.05 mg by mouth daily.   levothyroxine 75 MCG tablet Commonly known as: SYNTHROID Take 75 mcg by mouth daily before  breakfast.   omeprazole 20 MG capsule Commonly known as: PRILOSEC Take 20 mg by mouth daily.   oxybutynin 10 MG 24 hr tablet Commonly known as: DITROPAN-XL Take 10 mg by mouth daily. Taking differently: 20mg  BID x1 month per MD   oxyCODONE-acetaminophen 7.5-325 MG tablet Commonly known as: Percocet Take 1 tablet by mouth every 4 (four) hours as needed for severe pain.   PARoxetine 20 MG tablet Commonly known as: PAXIL Take 20 mg by mouth every morning.   pravastatin 20 MG tablet Commonly known as: PRAVACHOL Take 20 mg by mouth daily.   promethazine-dextromethorphan 6.25-15 MG/5ML syrup Commonly known as: PROMETHAZINE-DM Take 5 mLs by mouth 4 (four) times daily as needed for cough.   timolol 0.25 % ophthalmic solution Commonly known as: TIMOPTIC Place 1 drop into both eyes in the morning and at bedtime.       Follow-up Information    Velazquez, Vira Browns., MD. Schedule an appointment as soon as possible for a visit in 1 week(s).   Specialty: Internal Medicine Contact information: [address] [place] [address] [phone]              Allergies  Allergen Reactions  . Keflex [Cephalexin] Swelling and Rash    Facial swelling and rash  . Metaxalone Hives and Rash  . Morphine And Related Nausea And Vomiting    You were cared for by a hospitalist during your hospital stay. If you have any questions about your discharge medications or the care you received while you were in the hospital after you are discharged, you can call the unit and asked to speak with the hospitalist on call if the hospitalist that took care of you is not available. Once you are discharged, your primary care physician will handle any further medical issues. Please note that no refills for any discharge medications will be authorized once you are discharged, as it is imperative that you return to your primary care physician (or establish a relationship with a primary care  physician if you do not have one) for your aftercare needs so that they can reassess your need for medications and monitor your lab values.   Procedures/Studies: CT ANGIO HEAD W OR WO CONTRAST  Result Date: 07/26/2020 CLINICAL DATA:  Stroke suspected (Ped 0-18y) EXAM: CT ANGIOGRAPHY HEAD AND NECK TECHNIQUE: Multidetector CT imaging of the head and neck was performed using the standard protocol during bolus administration of intravenous contrast. Multiplanar CT image reconstructions and MIPs were obtained to evaluate the vascular anatomy. Carotid stenosis measurements (when applicable) are obtained utilizing NASCET criteria, using the distal internal carotid diameter as the denominator. CONTRAST:  65mL OMNIPAQUE IOHEXOL 350 MG/ML SOLN COMPARISON:  07/26/2020 and prior. FINDINGS: CTA NECK FINDINGS Aortic arch: Standard branching. Mild aortic arch atheromatous disease. Patent great vessel origins. Right carotid system:  Patent. Decreased conspicuity distal CCA ulceration. Left carotid system: Patent. Vertebral arteries: Mild right vertebral artery origin narrowing. Codominant vertebral arteries. Patent. Skeleton: Multilevel spondylosis.  No acute osseous finding. Other neck: No acute finding. Upper chest: Patchy upper lung opacities. Review of the MIP images confirms the above findings CTA HEAD FINDINGS Anterior circulation: Bilateral carotid siphon atherosclerotic calcifications. Patent ICAs. Patent MCAs. Patent ACAs. Posterior circulation: Patent V4 segments and PICA. Patent basilar and superior cerebellar arteries. Fetal origin of the right PCA, patent. Patent left PCA. Venous sinuses: As permitted by contrast timing, patent. Anatomic variants: Please see above. Review of the MIP images confirms the above findings IMPRESSION: No emergent vascular finding within the head or neck. Decreased conspicuity of distal right CCA ulceration. Patchy bilateral upper lung opacities, atelectasis versus infiltrate.  Electronically Signed   By: Stana Bunting M.D.   On: 07/26/2020 13:07   CT ANGIO NECK W OR WO CONTRAST  Result Date: 07/26/2020 CLINICAL DATA:  Stroke suspected (Ped 0-18y) EXAM: CT ANGIOGRAPHY HEAD AND NECK TECHNIQUE: Multidetector CT imaging of the head and neck was performed using the standard protocol during bolus administration of intravenous contrast. Multiplanar CT image reconstructions and MIPs were obtained to evaluate the vascular anatomy. Carotid stenosis measurements (when applicable) are obtained utilizing NASCET criteria, using the distal internal carotid diameter as the denominator. CONTRAST:  49mL OMNIPAQUE IOHEXOL 350 MG/ML SOLN COMPARISON:  07/26/2020 and prior. FINDINGS: CTA NECK FINDINGS Aortic arch: Standard branching. Mild aortic arch atheromatous disease. Patent great vessel origins. Right carotid system: Patent. Decreased conspicuity distal CCA ulceration. Left carotid system: Patent. Vertebral arteries: Mild right vertebral artery origin narrowing. Codominant vertebral arteries. Patent. Skeleton: Multilevel spondylosis.  No acute osseous finding. Other neck: No acute finding. Upper chest: Patchy upper lung opacities. Review of the MIP images confirms the above findings CTA HEAD FINDINGS Anterior circulation: Bilateral carotid siphon atherosclerotic calcifications. Patent ICAs. Patent MCAs. Patent ACAs. Posterior circulation: Patent V4 segments and PICA. Patent basilar and superior cerebellar arteries. Fetal origin of the right PCA, patent. Patent left PCA. Venous sinuses: As permitted by contrast timing, patent. Anatomic variants: Please see above. Review of the MIP images confirms the above findings IMPRESSION: No emergent vascular finding within the head or neck. Decreased conspicuity of distal right CCA ulceration. Patchy bilateral upper lung opacities, atelectasis versus infiltrate. Electronically Signed   By: Stana Bunting M.D.   On: 07/26/2020 13:07   MR BRAIN WO  CONTRAST  Result Date: 07/26/2020 CLINICAL DATA:  Initial evaluation for acute TIA, slurred speech. EXAM: MRI HEAD WITHOUT CONTRAST TECHNIQUE: Multiplanar, multiecho pulse sequences of the brain and surrounding structures were obtained without intravenous contrast. COMPARISON:  Prior head CT from 07/25/2020. FINDINGS: Brain: Cerebral volume within normal limits for age. Patchy T2/FLAIR hyperintensity within the periventricular and deep white matter both cerebral hemispheres as well as the pons, most consistent with chronic small vessel ischemic disease, moderate in nature. No abnormal foci of restricted diffusion to suggest acute or subacute ischemia. Gray-white matter differentiation maintained. No encephalomalacia to suggest chronic cortical infarction. No evidence for acute or chronic intracranial hemorrhage. No mass lesion, midline shift or mass effect. No hydrocephalus or extra-axial fluid collection. Pituitary gland and suprasellar region within normal limits. Midline structures intact and normal. Probable small DVA noted at the parasagittal right cerebellum. Vascular: Major intracranial vascular flow voids are maintained. Skull and upper cervical spine: Craniocervical junction within normal limits. Bone marrow signal intensity normal. No scalp soft tissue abnormality. Sinuses/Orbits: Globes and orbital soft  tissues within normal limits. Chronic right maxillary sinusitis noted. Paranasal sinuses are otherwise largely clear. Trace left mastoid effusion noted, of doubtful significance. Inner ear structures grossly normal. Other: Diffuse fatty infiltration of the parotid glands noted bilaterally. IMPRESSION: 1. No acute intracranial abnormality. 2. Moderate chronic microvascular ischemic disease. 3. Chronic right maxillary sinusitis. Electronically Signed   By: Rise Mu M.D.   On: 07/26/2020 03:37   DG Chest Port 1 View  Result Date: 07/25/2020 CLINICAL DATA:  Cough and congestion EXAM: PORTABLE  CHEST 1 VIEW COMPARISON:  None. FINDINGS: Cardiac shadow is at the upper limits of normal in size accentuated by the frontal technique. Bilateral breast implants are noted. The lungs are well aerated bilaterally with mild interstitial changes likely of a chronic nature. No focal confluent infiltrate is seen. Old rib fractures are noted on the right. No other bony abnormality is seen. IMPRESSION: Chronic interstitial changes without acute abnormality. Electronically Signed   By: Alcide Clever M.D.   On: 07/25/2020 22:47   ECHOCARDIOGRAM COMPLETE  Result Date: 07/27/2020    ECHOCARDIOGRAM REPORT   Patient Name:   Shannon Gutierrez Date of Exam: 07/26/2020 Medical Rec #:  [id number]        Height:       63.5 in Accession #:    [id number]       Weight:       169.8 lb Date of Birth:  March 01, 1946        BSA:          1.814 m Patient Age:    74 years         BP:           140/68 mmHg Patient Gender: F                HR:           68 bpm. Exam Location:  Inpatient Procedure: 2D Echo, Cardiac Doppler and Color Doppler Indications:    Stroke  History:        Patient has no prior history of Echocardiogram examinations.                 TIA; Risk Factors:Dyslipidemia. GERD, COVID-19 positive, Patient                 has breast implant per patient.  Sonographer:    Celesta Gentile RCS Referring Phys: [id number] [facility]  Sonographer Comments: Technically difficult study due to poor echo windows. Image acquisition challenging due to breast implants. IMPRESSIONS  1. Left ventricular ejection fraction, by estimation, is 65 to 70%. The left ventricle has normal function. The left ventricle has no regional wall motion abnormalities. Left ventricular diastolic parameters are consistent with Grade I diastolic dysfunction (impaired relaxation). Elevated left ventricular end-diastolic pressure.  2. Right ventricular systolic function is normal. The right ventricular size is normal. Tricuspid regurgitation signal is inadequate for assessing  PA pressure.  3. The mitral valve is normal in structure. No evidence of mitral valve regurgitation. No evidence of mitral stenosis.  4. The aortic valve is normal in structure. Aortic valve regurgitation is mild. No aortic stenosis is present. Aortic regurgitation PHT measures 542 msec. FINDINGS  Left Ventricle: Left ventricular ejection fraction, by estimation, is 65 to 70%. The left ventricle has normal function. The left ventricle has no regional wall motion abnormalities. Definity contrast agent was given IV to delineate the left ventricular  endocardial borders. The left ventricular internal cavity size was normal in  size. There is no left ventricular hypertrophy. Left ventricular diastolic parameters are consistent with Grade I diastolic dysfunction (impaired relaxation). Elevated left ventricular end-diastolic pressure. Right Ventricle: The right ventricular size is normal. No increase in right ventricular wall thickness. Right ventricular systolic function is normal. Tricuspid regurgitation signal is inadequate for assessing PA pressure. Left Atrium: Left atrial size was normal in size. Right Atrium: Right atrial size was normal in size. Pericardium: There is no evidence of pericardial effusion. Mitral Valve: The mitral valve is normal in structure. No evidence of mitral valve regurgitation. No evidence of mitral valve stenosis. Tricuspid Valve: The tricuspid valve is normal in structure. Tricuspid valve regurgitation is not demonstrated. No evidence of tricuspid stenosis. Aortic Valve: The aortic valve is normal in structure. Aortic valve regurgitation is mild. Aortic regurgitation PHT measures 542 msec. No aortic stenosis is present. Pulmonic Valve: The pulmonic valve was normal in structure. Pulmonic valve regurgitation is trivial. No evidence of pulmonic stenosis. Aorta: The aortic root is normal in size and structure. Venous: The inferior vena cava was not well visualized. IAS/Shunts: No atrial level  shunt detected by color flow Doppler.  LEFT VENTRICLE PLAX 2D LVIDd:         4.30 cm  Diastology LVIDs:         2.50 cm  LV e' medial:    4.24 cm/s LV PW:         1.00 cm  LV E/e' medial:  16.2 LV IVS:        1.30 cm  LV e' lateral:   4.24 cm/s LVOT diam:     2.00 cm  LV E/e' lateral: 16.2 LV SV:         49 LV SV Index:   27 LVOT Area:     3.14 cm  RIGHT VENTRICLE RV S prime:     14.40 cm/s TAPSE (M-mode): 2.0 cm LEFT ATRIUM             Index       RIGHT ATRIUM           Index LA diam:        2.70 cm 1.49 cm/m  RA Area:     11.70 cm LA Vol (A2C):   15.9 ml 8.76 ml/m  RA Volume:   22.20 ml  12.24 ml/m LA Vol (A4C):   42.6 ml 23.48 ml/m LA Biplane Vol: 28.3 ml 15.60 ml/m  AORTIC VALVE LVOT Vmax:   80.25 cm/s LVOT Vmean:  48.050 cm/s LVOT VTI:    0.154 m AI PHT:      542 msec  AORTA Ao Root diam: 3.10 cm MITRAL VALVE MV Area (PHT): 2.66 cm    SHUNTS MV Decel Time: 285 msec    Systemic VTI:  0.15 m MV E velocity: 68.60 cm/s  Systemic Diam: 2.00 cm MV A velocity: 87.80 cm/s MV E/A ratio:  0.78 Armanda Magic MD Electronically signed by Armanda Magic MD Signature Date/Time: 07/27/2020/12:38:14 AM    Final    DG Hip Unilat W or Wo Pelvis 2-3 Views Left  Result Date: 07/25/2020 CLINICAL DATA:  Recent fall with left hip pain EXAM: DG HIP (WITH OR WITHOUT PELVIS) 3V LEFT COMPARISON:  None. FINDINGS: Pelvic ring is intact. Postsurgical changes are noted in the lower lumbar spine. Mild degenerative changes of left hip joint are noted. No acute fracture or dislocation is seen. No soft tissue abnormality is noted. IMPRESSION: No acute abnormality noted. Electronically Signed   By: Loraine Leriche  Lukens M.D.   On: 07/25/2020 22:46   DG Hip Unilat W or Wo Pelvis 2-3 Views Right  Result Date: 07/25/2020 CLINICAL DATA:  Recent fall with right hip pain, initial encounter EXAM: DG HIP (WITH OR WITHOUT PELVIS) 3V RIGHT COMPARISON:  None. FINDINGS: Pelvic ring is intact. No acute fracture or dislocation is noted. No soft tissue  abnormality is seen. Postsurgical changes in the lumbar spine are noted. IMPRESSION: No acute abnormality noted. Electronically Signed   By: Alcide CleverMark  Lukens M.D.   On: 07/25/2020 22:48   CT HEAD CODE STROKE WO CONTRAST  Result Date: 07/25/2020 CLINICAL DATA:  Code stroke. Initial evaluation for acute left-sided weakness, slurred speech. EXAM: CT HEAD WITHOUT CONTRAST TECHNIQUE: Contiguous axial images were obtained from the base of the skull through the vertex without intravenous contrast. COMPARISON:  Prior CT from 09/17/2019. FINDINGS: Brain: Moderately advanced age-related cerebral atrophy with chronic small vessel ischemic disease. No acute intracranial hemorrhage. No acute large vessel territory infarct. No mass lesion, midline shift or mass effect. No hydrocephalus or extra-axial fluid collection. Vascular: No hyperdense vessel. Scattered vascular calcifications noted within the carotid siphons. Skull: Scalp soft tissues and calvarium within normal limits. Sinuses/Orbits: Globes and orbital soft tissues demonstrate no acute finding. Chronic right maxillary sinusitis noted. Additional scattered mild mucosal thickening noted elsewhere within the paranasal sinuses. No mastoid effusion. Other: None. ASPECTS [facility]([place] Stroke Program Early CT Score) - Ganglionic level infarction (caudate, lentiform nuclei, internal capsule, insula, M1-M3 cortex): 7 - Supraganglionic infarction (M4-M6 cortex): 3 Total score (0-10 with 10 being normal): 10 IMPRESSION: 1. No acute intracranial infarct or other abnormality. 2. ASPECTS is 10. 3. Moderately advanced cerebral atrophy with chronic small vessel ischemic disease. These results were communicated to Dr. Thomasena Edisollins at 8:49 pmon 1/21/2022by text page via the [facility] messaging system. Electronically Signed   By: Rise MuBenjamin  McClintock M.D.   On: 07/25/2020 20:51      The results of significant diagnostics from this hospitalization (including imaging, microbiology, ancillary and  laboratory) are listed below for reference.     Microbiology: Recent Results (from the past 240 hour(s))  SARS Coronavirus 2 by RT PCR (hospital order, performed [facility]) Nasopharyngeal Nasopharyngeal Swab     Status: Abnormal   Collection Time: 07/25/20  9:01 PM   Specimen: Nasopharyngeal Swab  Result Value Ref Range Status   SARS Coronavirus 2 POSITIVE (A) NEGATIVE Final    Comment: RESULT CALLED TO, READ BACK BY AND VERIFIED WITH: Heloise PurpuraJ FERRAINOLO RN 07/25/20 2207 JDW (NOTE) SARS-CoV-2 target nucleic acids are DETECTED  SARS-CoV-2 RNA is generally detectable in upper respiratory specimens  during the acute phase of infection.  Positive results are indicative  of the presence of the identified virus, but do not rule out bacterial infection or co-infection with other pathogens not detected by the test.  Clinical correlation with patient history and  other diagnostic information is necessary to determine patient infection status.  The expected result is negative.  Fact Sheet for Patients:   BoilerBrush.com.cyhttps://www.fda.gov/media/136312/download   Fact Sheet for Healthcare Providers:   https://pope.com/https://www.fda.gov/media/136313/download    This test is not yet approved or cleared by the [place] FDA and  has been authorized for detection and/or diagnosis of SARS-CoV-2 by FDA under an Emergency Use Authorization (EUA).  This EUA will remain in effect (meaning this test  can be used) for the duration of  the COVID-19 declaration under Section 564(b)(1) of the Act, 21 U.S.C. section 360-bbb-3(b)(1), unless the authorization is  terminated or revoked sooner.  Performed at [facility], [address] [place], [place] [address]   Blood culture (routine x 2)     Status: None (Preliminary result)   Collection Time: 07/25/20 10:30 PM   Specimen: BLOOD  Result Value Ref Range Status   Specimen Description BLOOD SITE NOT SPECIFIED  Final   Special Requests   Final    BOTTLES  DRAWN AEROBIC AND ANAEROBIC Blood Culture adequate volume   Culture   Final    NO GROWTH < 24 HOURS Performed at [facility], [address] [place], [place] [address]    Report Status PENDING  Incomplete  Blood culture (routine x 2)     Status: None (Preliminary result)   Collection Time: 07/25/20 10:38 PM   Specimen: BLOOD  Result Value Ref Range Status   Specimen Description BLOOD SITE NOT SPECIFIED  Final   Special Requests   Final    BOTTLES DRAWN AEROBIC AND ANAEROBIC Blood Culture adequate volume   Culture   Final    NO GROWTH < 24 HOURS Performed at [facility], [address] [place], [place] [address]    Report Status PENDING  Incomplete     Labs: BNP (last 3 results) No results for input(s): BNP in the last 8760 hours. Basic Metabolic Panel: Recent Labs  Lab 07/25/20 2037 07/25/20 2038 07/27/20 0139  NA 137 140 143  K 3.5 3.5 3.3*  CL 104 105 109  CO2 23  --  25  GLUCOSE 107* 102* 97  BUN 10 11 14   CREATININE 0.88 0.80 1.01*  CALCIUM 8.5*  --  8.3*  MG  --   --  2.1   Liver Function Tests: Recent Labs  Lab 07/25/20 2037  AST 42*  ALT 16  ALKPHOS 78  BILITOT 1.8*  PROT 6.8  ALBUMIN 3.6   No results for input(s): LIPASE, AMYLASE in the last 168 hours. No results for input(s): AMMONIA in the last 168 hours. CBC: Recent Labs  Lab 07/25/20 2037 07/25/20 2038 07/27/20 0139  WBC 5.7  --  6.5  NEUTROABS 3.2  --   --   HGB 13.1 13.6 11.6*  HCT 43.3 40.0 37.5  MCV 87.1  --  87.4  PLT 200  --  189   Cardiac Enzymes: No results for input(s): CKTOTAL, CKMB, CKMBINDEX, TROPONINI in the last 168 hours. BNP: Invalid input(s): POCBNP CBG: Recent Labs  Lab 07/25/20 2032  GLUCAP 102*   D-Dimer Recent Labs    07/25/20 2335 07/27/20 0139  DDIMER 0.46 0.41   Hgb A1c Recent Labs    07/26/20 0500  HGBA1C 5.4   Lipid Profile Recent Labs    07/25/20 2335 07/26/20 0500  CHOL  --  78  HDL  --  24*  LDLCALC  --  40  TRIG 85  68  CHOLHDL  --  3.3   Thyroid function studies No results for input(s): TSH, T4TOTAL, T3FREE, THYROIDAB in the last 72 hours.  Invalid input(s): FREET3 Anemia work up Recent Labs    07/25/20 2335 07/27/20 0139  FERRITIN 26 31   Urinalysis    Component Value Date/Time   COLORURINE AMBER (A) 07/25/2020 2329   APPEARANCEUR CLEAR 07/25/2020 2329   LABSPEC 1.017 07/25/2020 2329   PHURINE 5.0 07/25/2020 2329   GLUCOSEU 50 (A) 07/25/2020 2329   HGBUR NEGATIVE 07/25/2020 2329   BILIRUBINUR NEGATIVE 07/25/2020 2329   KETONESUR NEGATIVE 07/25/2020 2329  PROTEINUR NEGATIVE 07/25/2020 2329   NITRITE NEGATIVE 07/25/2020 2329   LEUKOCYTESUR NEGATIVE 07/25/2020 2329   Sepsis Labs Invalid input(s): PROCALCITONIN,  WBC,  LACTICIDVEN Microbiology Recent Results (from the past 240 hour(s))  SARS Coronavirus 2 by RT PCR (hospital order, performed in [facility] hospital lab) Nasopharyngeal Nasopharyngeal Swab     Status: Abnormal   Collection Time: 07/25/20  9:01 PM   Specimen: Nasopharyngeal Swab  Result Value Ref Range Status   SARS Coronavirus 2 POSITIVE (A) NEGATIVE Final    Comment: RESULT CALLED TO, READ BACK BY AND VERIFIED WITH: Heloise Purpura RN 07/25/20 2207 JDW (NOTE) SARS-CoV-2 target nucleic acids are DETECTED  SARS-CoV-2 RNA is generally detectable in upper respiratory specimens  during the acute phase of infection.  Positive results are indicative  of the presence of the identified virus, but do not rule out bacterial infection or co-infection with other pathogens not detected by the test.  Clinical correlation with patient history and  other diagnostic information is necessary to determine patient infection status.  The expected result is negative.  Fact Sheet for Patients:   BoilerBrush.com.cy   Fact Sheet for Healthcare Providers:   https://pope.com/    This test is not yet approved or cleared by the [place]  FDA and  has been authorized for detection and/or diagnosis of SARS-CoV-2 by FDA under an Emergency Use Authorization (EUA).  This EUA will remain in effect (meaning this test  can be used) for the duration of  the COVID-19 declaration under Section 564(b)(1) of the Act, 21 U.S.C. section 360-bbb-3(b)(1), unless the authorization is terminated or revoked sooner.  Performed at [facility], [address], [place], [place] [address]   Blood culture (routine x 2)     Status: None (Preliminary result)   Collection Time: 07/25/20 10:30 PM   Specimen: BLOOD  Result Value Ref Range Status   Specimen Description BLOOD SITE NOT SPECIFIED  Final   Special Requests   Final    BOTTLES DRAWN AEROBIC AND ANAEROBIC Blood Culture adequate volume   Culture   Final    NO GROWTH < 24 HOURS Performed at [facility], [address], [place], [place] [address]    Report Status PENDING  Incomplete  Blood culture (routine x 2)     Status: None (Preliminary result)   Collection Time: 07/25/20 10:38 PM   Specimen: BLOOD  Result Value Ref Range Status   Specimen Description BLOOD SITE NOT SPECIFIED  Final   Special Requests   Final    BOTTLES DRAWN AEROBIC AND ANAEROBIC Blood Culture adequate volume   Culture   Final    NO GROWTH < 24 HOURS Performed at [facility], [address], [place], [place] [address]    Report Status PENDING  Incomplete     Time coordinating discharge:  I have spent 35 minutes face to face with the patient and on the ward discussing the patients care, assessment, plan and disposition with other care givers. >50% of the time was devoted counseling the patient about the risks and benefits of treatment/Discharge disposition and coordinating care.   SIGNED:   Dimple Nanas, MD  [facility] 07/27/2020, 7:32 AM   If 7PM-7AM, please contact night-coverage

## 2020-07-30 DIAGNOSIS — G3184 Mild cognitive impairment, so stated: Secondary | ICD-10-CM | POA: Diagnosis not present

## 2020-07-30 DIAGNOSIS — R4189 Other symptoms and signs involving cognitive functions and awareness: Secondary | ICD-10-CM | POA: Diagnosis not present

## 2020-07-30 DIAGNOSIS — E039 Hypothyroidism, unspecified: Secondary | ICD-10-CM | POA: Diagnosis not present

## 2020-07-30 DIAGNOSIS — R4689 Other symptoms and signs involving appearance and behavior: Secondary | ICD-10-CM | POA: Diagnosis not present

## 2020-07-30 DIAGNOSIS — G459 Transient cerebral ischemic attack, unspecified: Secondary | ICD-10-CM | POA: Diagnosis not present

## 2020-07-30 DIAGNOSIS — R42 Dizziness and giddiness: Secondary | ICD-10-CM | POA: Diagnosis not present

## 2020-07-31 LAB — CULTURE, BLOOD (ROUTINE X 2)
Culture: NO GROWTH
Culture: NO GROWTH
Special Requests: ADEQUATE
Special Requests: ADEQUATE

## 2020-08-06 DIAGNOSIS — R42 Dizziness and giddiness: Secondary | ICD-10-CM | POA: Diagnosis not present

## 2020-09-02 DIAGNOSIS — R42 Dizziness and giddiness: Secondary | ICD-10-CM | POA: Diagnosis not present

## 2020-09-10 DIAGNOSIS — R42 Dizziness and giddiness: Secondary | ICD-10-CM | POA: Diagnosis not present

## 2020-09-10 DIAGNOSIS — Z8673 Personal history of transient ischemic attack (TIA), and cerebral infarction without residual deficits: Secondary | ICD-10-CM | POA: Diagnosis not present

## 2020-09-10 DIAGNOSIS — G3184 Mild cognitive impairment, so stated: Secondary | ICD-10-CM | POA: Diagnosis not present

## 2020-09-10 DIAGNOSIS — R2689 Other abnormalities of gait and mobility: Secondary | ICD-10-CM | POA: Diagnosis not present

## 2020-09-15 DIAGNOSIS — E890 Postprocedural hypothyroidism: Secondary | ICD-10-CM | POA: Diagnosis not present

## 2020-09-19 DIAGNOSIS — H524 Presbyopia: Secondary | ICD-10-CM | POA: Diagnosis not present

## 2020-09-19 DIAGNOSIS — H52222 Regular astigmatism, left eye: Secondary | ICD-10-CM | POA: Diagnosis not present

## 2020-09-19 DIAGNOSIS — G453 Amaurosis fugax: Secondary | ICD-10-CM | POA: Diagnosis not present

## 2020-09-19 DIAGNOSIS — H25013 Cortical age-related cataract, bilateral: Secondary | ICD-10-CM | POA: Diagnosis not present

## 2020-09-19 DIAGNOSIS — H5203 Hypermetropia, bilateral: Secondary | ICD-10-CM | POA: Diagnosis not present

## 2020-09-19 DIAGNOSIS — H401134 Primary open-angle glaucoma, bilateral, indeterminate stage: Secondary | ICD-10-CM | POA: Diagnosis not present

## 2020-09-19 DIAGNOSIS — H2513 Age-related nuclear cataract, bilateral: Secondary | ICD-10-CM | POA: Diagnosis not present

## 2020-09-19 DIAGNOSIS — H43813 Vitreous degeneration, bilateral: Secondary | ICD-10-CM | POA: Diagnosis not present

## 2020-10-06 DIAGNOSIS — R42 Dizziness and giddiness: Secondary | ICD-10-CM | POA: Diagnosis not present

## 2020-10-08 DIAGNOSIS — Z0000 Encounter for general adult medical examination without abnormal findings: Secondary | ICD-10-CM | POA: Diagnosis not present

## 2020-10-08 DIAGNOSIS — Z79899 Other long term (current) drug therapy: Secondary | ICD-10-CM | POA: Diagnosis not present

## 2020-10-08 DIAGNOSIS — K2100 Gastro-esophageal reflux disease with esophagitis, without bleeding: Secondary | ICD-10-CM | POA: Diagnosis not present

## 2020-10-08 DIAGNOSIS — E890 Postprocedural hypothyroidism: Secondary | ICD-10-CM | POA: Diagnosis not present

## 2020-10-08 DIAGNOSIS — Z79890 Hormone replacement therapy (postmenopausal): Secondary | ICD-10-CM | POA: Diagnosis not present

## 2020-10-08 DIAGNOSIS — Z8673 Personal history of transient ischemic attack (TIA), and cerebral infarction without residual deficits: Secondary | ICD-10-CM | POA: Diagnosis not present

## 2020-10-08 DIAGNOSIS — Z7982 Long term (current) use of aspirin: Secondary | ICD-10-CM | POA: Diagnosis not present

## 2020-10-08 DIAGNOSIS — E785 Hyperlipidemia, unspecified: Secondary | ICD-10-CM | POA: Diagnosis not present

## 2020-10-30 IMAGING — RF DG C-ARM 1-60 MIN
1 series · 2 of 2 positions shown · non-contrast
Comparison: CT 03/06/2019

CLINICAL DATA: Extension of lumbar fusion at L2-3

EXAM:
LUMBAR SPINE - 2-3 VIEW; DG C-ARM 1-60 MIN

[Series 1: run · 2 of 2 slices shown]
[im 1/2]
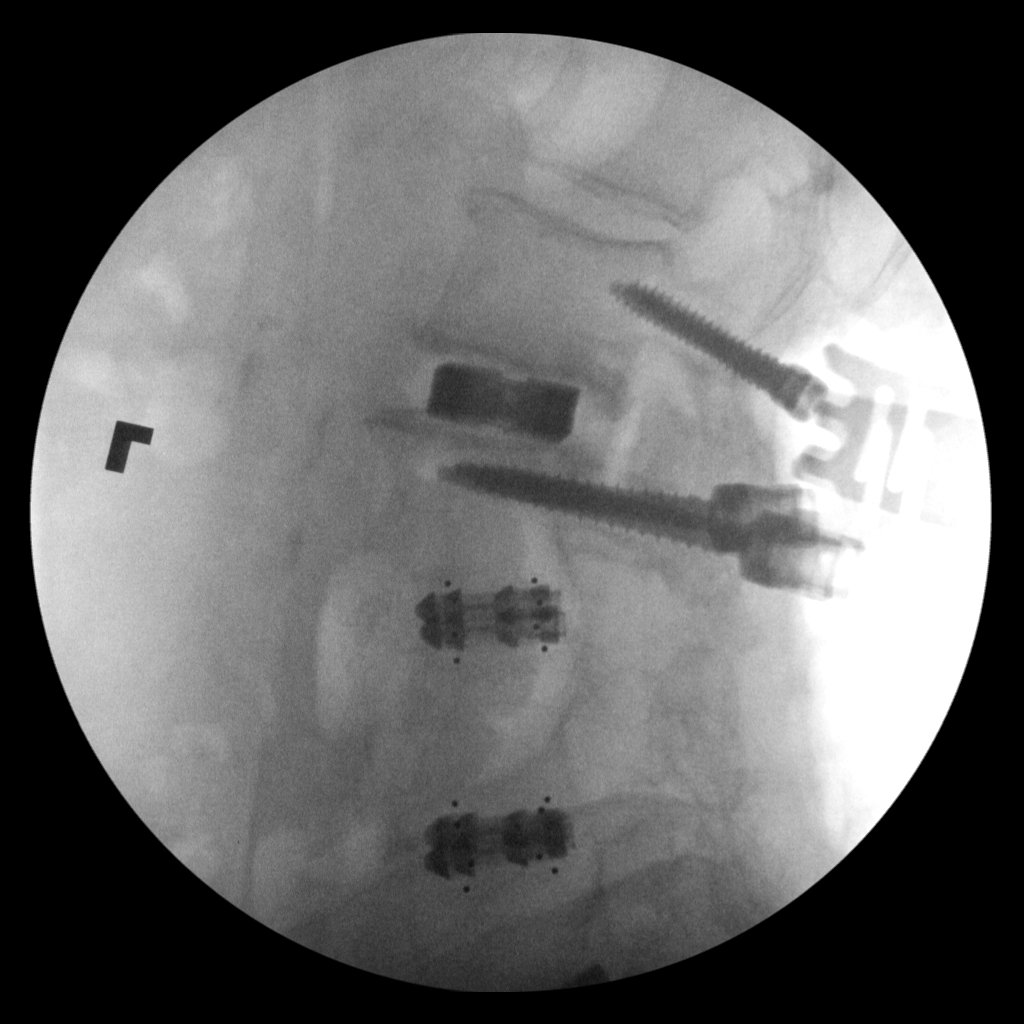
[im 2/2]
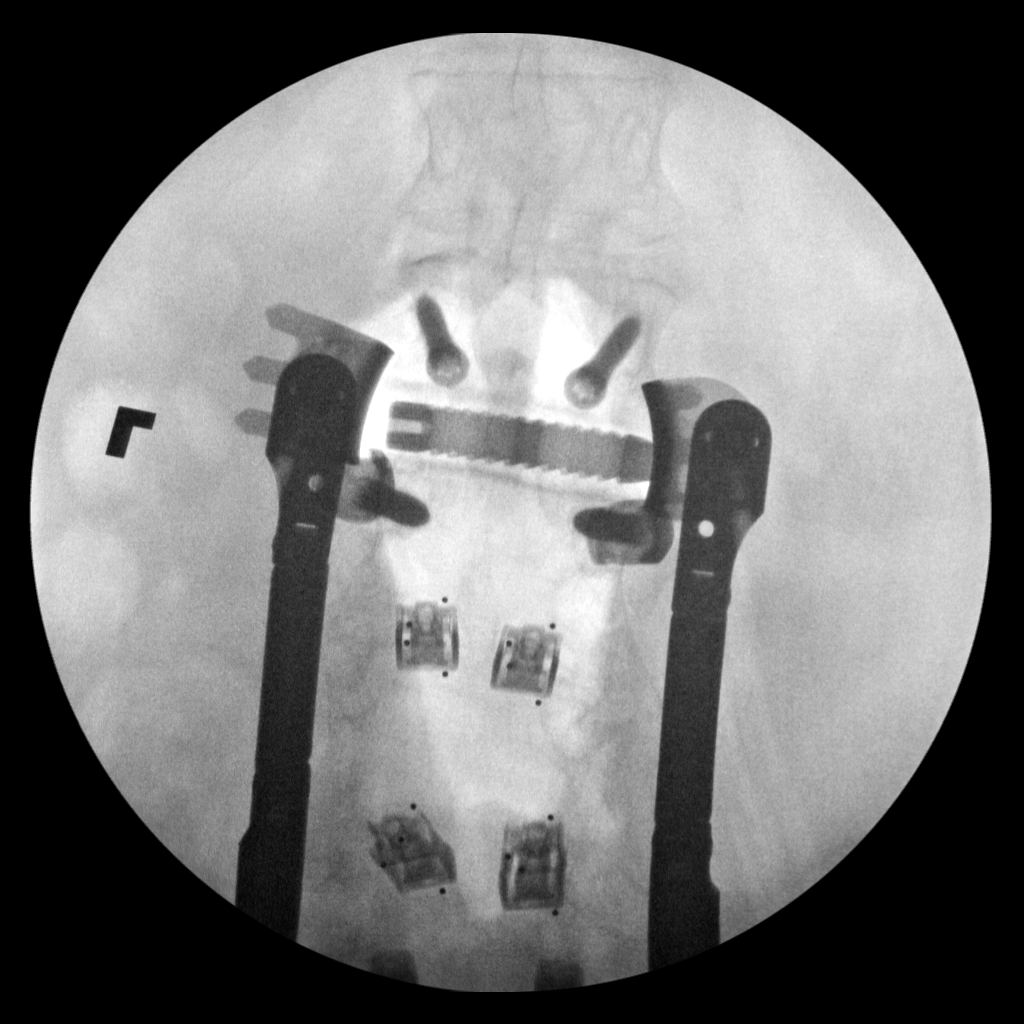

[2 of 2 positions shown; findings below may reference images not displayed]

FINDINGS: Intraoperative spot images demonstrate placement of pedicle screws
at L2-3 and removal of the prior mid to lower pedicle screws. No
visible hardware bony complicating feature.
IMPRESSION: Posterior pedicle screw placement at L2-3 and removal of prior
hardware. No visible complicating feature.

## 2020-11-04 DIAGNOSIS — N3281 Overactive bladder: Secondary | ICD-10-CM | POA: Diagnosis not present

## 2020-11-04 DIAGNOSIS — E890 Postprocedural hypothyroidism: Secondary | ICD-10-CM | POA: Diagnosis not present

## 2020-11-04 DIAGNOSIS — M170 Bilateral primary osteoarthritis of knee: Secondary | ICD-10-CM | POA: Diagnosis not present

## 2020-11-04 DIAGNOSIS — M4726 Other spondylosis with radiculopathy, lumbar region: Secondary | ICD-10-CM | POA: Diagnosis not present

## 2020-11-04 DIAGNOSIS — Z96693 Finger-joint replacement, bilateral: Secondary | ICD-10-CM | POA: Diagnosis not present

## 2020-11-04 DIAGNOSIS — Z87891 Personal history of nicotine dependence: Secondary | ICD-10-CM | POA: Diagnosis not present

## 2020-11-04 DIAGNOSIS — K219 Gastro-esophageal reflux disease without esophagitis: Secondary | ICD-10-CM | POA: Diagnosis not present

## 2020-11-04 DIAGNOSIS — M5116 Intervertebral disc disorders with radiculopathy, lumbar region: Secondary | ICD-10-CM | POA: Diagnosis not present

## 2020-11-04 DIAGNOSIS — M48061 Spinal stenosis, lumbar region without neurogenic claudication: Secondary | ICD-10-CM | POA: Diagnosis not present

## 2020-11-04 DIAGNOSIS — M419 Scoliosis, unspecified: Secondary | ICD-10-CM | POA: Diagnosis not present

## 2020-11-04 DIAGNOSIS — H8110 Benign paroxysmal vertigo, unspecified ear: Secondary | ICD-10-CM | POA: Diagnosis not present

## 2020-11-04 DIAGNOSIS — E7800 Pure hypercholesterolemia, unspecified: Secondary | ICD-10-CM | POA: Diagnosis not present

## 2020-11-04 DIAGNOSIS — N200 Calculus of kidney: Secondary | ICD-10-CM | POA: Diagnosis not present

## 2020-11-04 DIAGNOSIS — Z981 Arthrodesis status: Secondary | ICD-10-CM | POA: Diagnosis not present

## 2020-11-04 DIAGNOSIS — G8929 Other chronic pain: Secondary | ICD-10-CM | POA: Diagnosis not present

## 2020-11-04 DIAGNOSIS — F411 Generalized anxiety disorder: Secondary | ICD-10-CM | POA: Diagnosis not present

## 2020-11-04 DIAGNOSIS — Z79890 Hormone replacement therapy (postmenopausal): Secondary | ICD-10-CM | POA: Diagnosis not present

## 2020-11-04 DIAGNOSIS — Z7982 Long term (current) use of aspirin: Secondary | ICD-10-CM | POA: Diagnosis not present

## 2020-11-04 DIAGNOSIS — F4322 Adjustment disorder with anxiety: Secondary | ICD-10-CM | POA: Diagnosis not present

## 2020-11-04 DIAGNOSIS — H409 Unspecified glaucoma: Secondary | ICD-10-CM | POA: Diagnosis not present

## 2020-11-04 DIAGNOSIS — K5790 Diverticulosis of intestine, part unspecified, without perforation or abscess without bleeding: Secondary | ICD-10-CM | POA: Diagnosis not present

## 2020-11-04 DIAGNOSIS — Z9181 History of falling: Secondary | ICD-10-CM | POA: Diagnosis not present

## 2020-11-12 DIAGNOSIS — H40053 Ocular hypertension, bilateral: Secondary | ICD-10-CM | POA: Diagnosis not present

## 2020-11-12 DIAGNOSIS — H04123 Dry eye syndrome of bilateral lacrimal glands: Secondary | ICD-10-CM | POA: Diagnosis not present

## 2020-12-11 DIAGNOSIS — Z8673 Personal history of transient ischemic attack (TIA), and cerebral infarction without residual deficits: Secondary | ICD-10-CM | POA: Diagnosis not present

## 2020-12-11 DIAGNOSIS — R42 Dizziness and giddiness: Secondary | ICD-10-CM | POA: Diagnosis not present

## 2020-12-11 DIAGNOSIS — G3184 Mild cognitive impairment, so stated: Secondary | ICD-10-CM | POA: Diagnosis not present

## 2021-02-11 DIAGNOSIS — Z8673 Personal history of transient ischemic attack (TIA), and cerebral infarction without residual deficits: Secondary | ICD-10-CM | POA: Diagnosis not present

## 2021-02-12 DIAGNOSIS — Z8673 Personal history of transient ischemic attack (TIA), and cerebral infarction without residual deficits: Secondary | ICD-10-CM | POA: Diagnosis not present

## 2021-03-26 DIAGNOSIS — F321 Major depressive disorder, single episode, moderate: Secondary | ICD-10-CM | POA: Diagnosis not present

## 2021-03-26 DIAGNOSIS — F419 Anxiety disorder, unspecified: Secondary | ICD-10-CM | POA: Diagnosis not present

## 2021-03-26 DIAGNOSIS — R4181 Age-related cognitive decline: Secondary | ICD-10-CM | POA: Diagnosis not present

## 2021-03-26 DIAGNOSIS — F0390 Unspecified dementia without behavioral disturbance: Secondary | ICD-10-CM | POA: Diagnosis not present

## 2021-04-14 DIAGNOSIS — G3184 Mild cognitive impairment, so stated: Secondary | ICD-10-CM | POA: Diagnosis not present

## 2021-04-14 DIAGNOSIS — Z79890 Hormone replacement therapy (postmenopausal): Secondary | ICD-10-CM | POA: Diagnosis not present

## 2021-04-14 DIAGNOSIS — Z1231 Encounter for screening mammogram for malignant neoplasm of breast: Secondary | ICD-10-CM | POA: Diagnosis not present

## 2021-04-14 DIAGNOSIS — E890 Postprocedural hypothyroidism: Secondary | ICD-10-CM | POA: Diagnosis not present

## 2021-04-14 DIAGNOSIS — E785 Hyperlipidemia, unspecified: Secondary | ICD-10-CM | POA: Diagnosis not present

## 2021-04-14 DIAGNOSIS — Z23 Encounter for immunization: Secondary | ICD-10-CM | POA: Diagnosis not present

## 2021-04-14 DIAGNOSIS — K2100 Gastro-esophageal reflux disease with esophagitis, without bleeding: Secondary | ICD-10-CM | POA: Diagnosis not present

## 2021-04-28 DIAGNOSIS — Z1212 Encounter for screening for malignant neoplasm of rectum: Secondary | ICD-10-CM | POA: Diagnosis not present

## 2021-04-28 DIAGNOSIS — Z1211 Encounter for screening for malignant neoplasm of colon: Secondary | ICD-10-CM | POA: Diagnosis not present

## 2021-06-11 DIAGNOSIS — E039 Hypothyroidism, unspecified: Secondary | ICD-10-CM | POA: Diagnosis not present

## 2021-06-11 DIAGNOSIS — G3184 Mild cognitive impairment, so stated: Secondary | ICD-10-CM | POA: Diagnosis not present

## 2021-06-11 DIAGNOSIS — E785 Hyperlipidemia, unspecified: Secondary | ICD-10-CM | POA: Diagnosis not present

## 2021-06-11 DIAGNOSIS — Z8673 Personal history of transient ischemic attack (TIA), and cerebral infarction without residual deficits: Secondary | ICD-10-CM | POA: Diagnosis not present

## 2022-02-08 IMAGING — DX DG HIP (WITH OR WITHOUT PELVIS) 2-3V*R*
2 series · 2 of 2 positions shown · non-contrast
Comparison: None.

CLINICAL DATA: Recent fall with right hip pain, initial encounter

EXAM:
DG HIP (WITH OR WITHOUT PELVIS) 3V RIGHT

[hip ap]
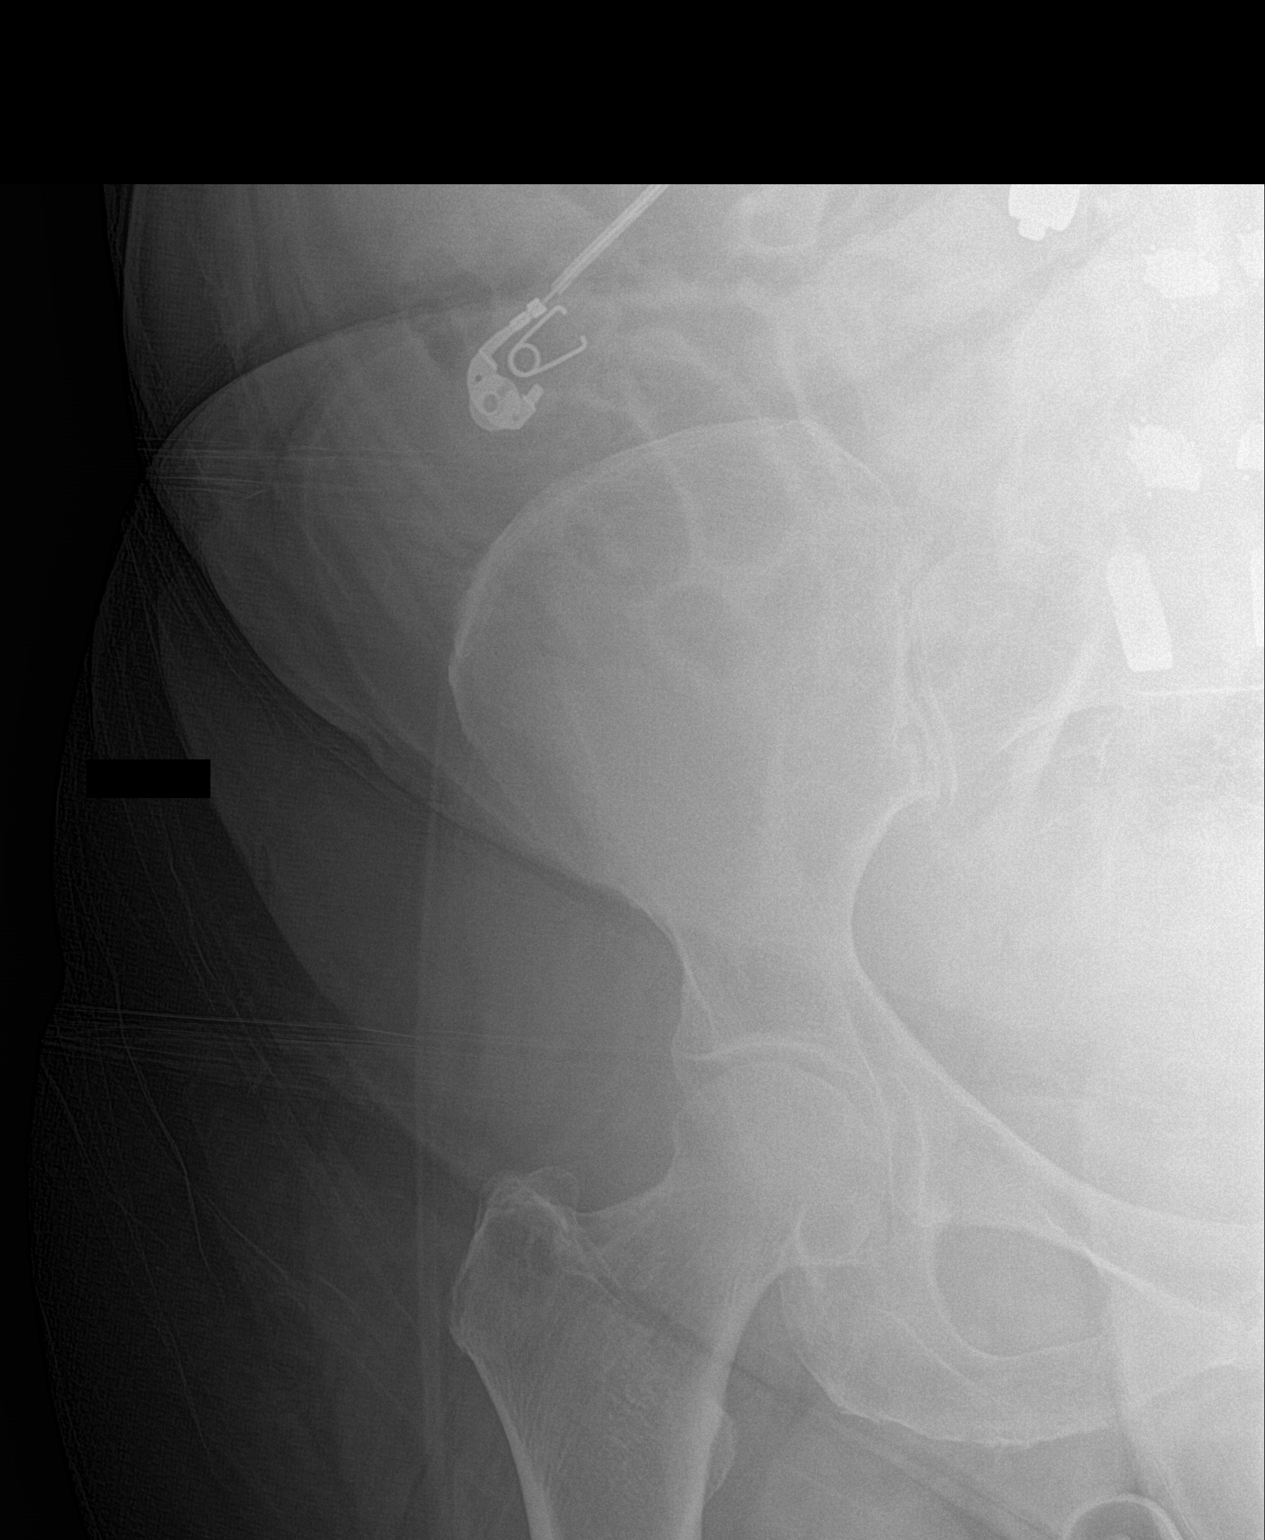

[hip lat]
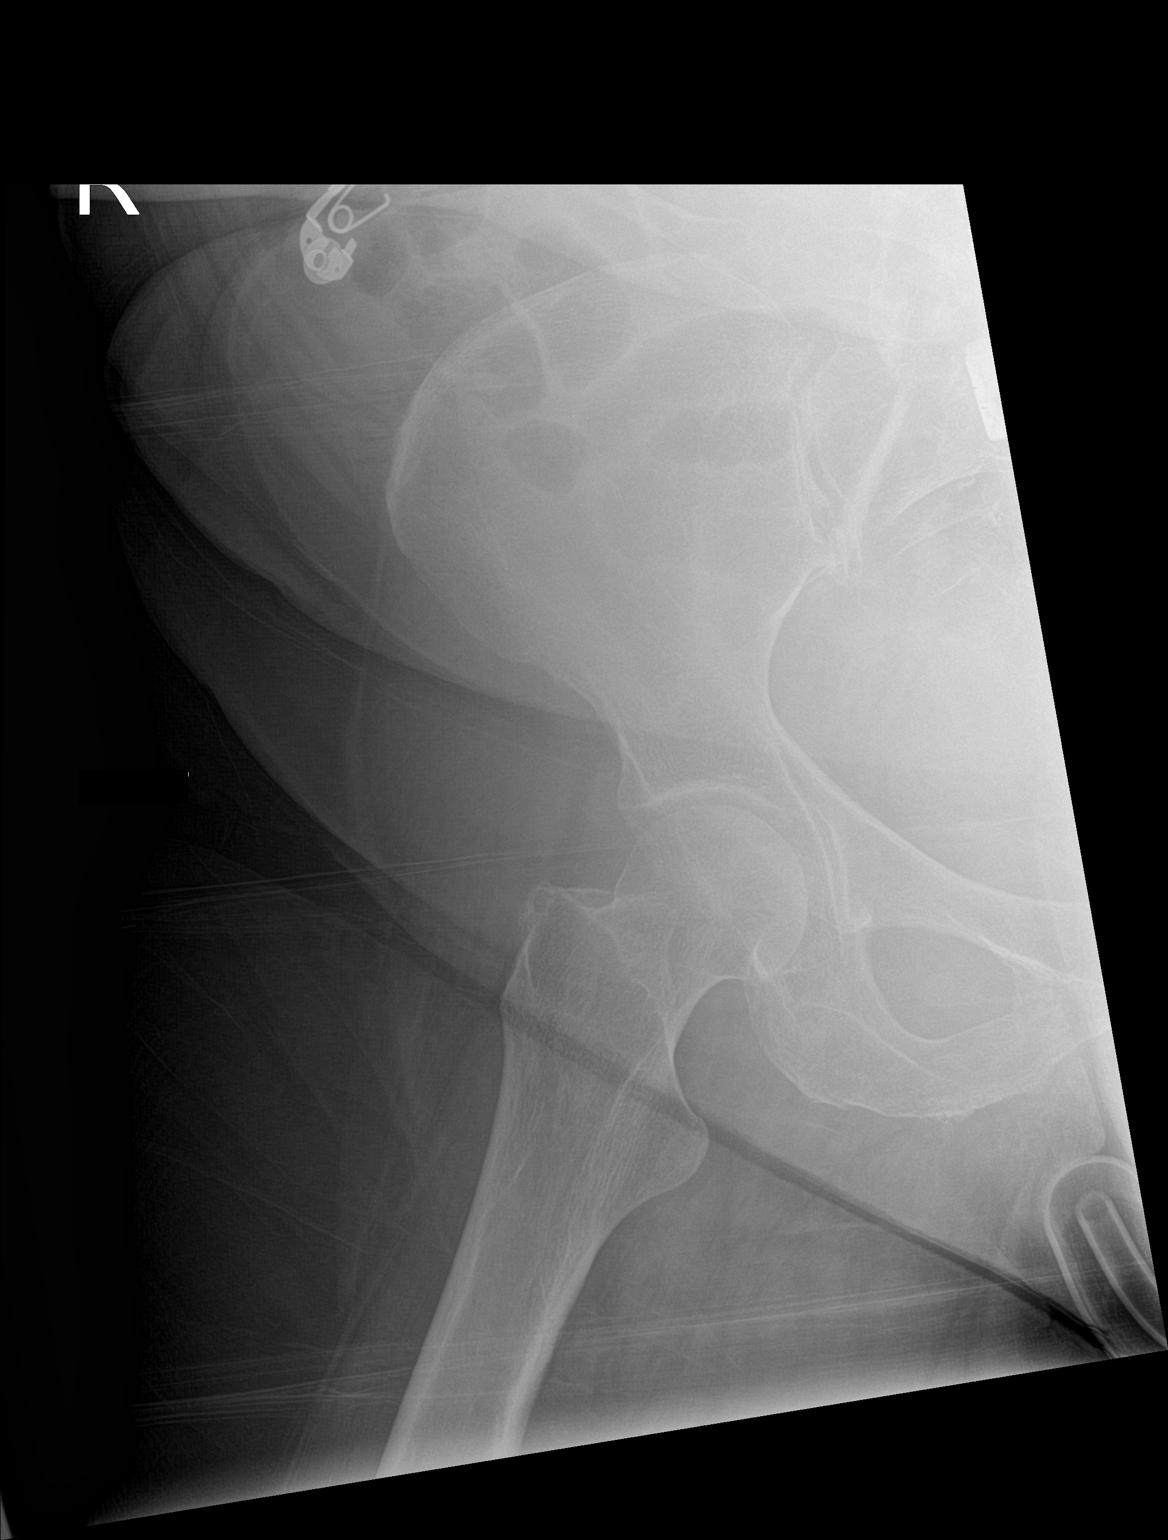

[2 of 2 positions shown; findings below may reference images not displayed]

FINDINGS: Pelvic ring is intact. No acute fracture or dislocation is noted. No
soft tissue abnormality is seen. Postsurgical changes in the lumbar
spine are noted.
IMPRESSION: No acute abnormality noted.
